# Patient Record
Sex: Male | Born: 1950
Health system: Southern US, Community
[De-identification: ages and names within clinical notes are randomized; demographics above are authoritative.]

## PROBLEM LIST (undated history)

## (undated) DIAGNOSIS — C73 Malignant neoplasm of thyroid gland: Secondary | ICD-10-CM

## (undated) DIAGNOSIS — F430 Acute stress reaction: Secondary | ICD-10-CM

## (undated) DIAGNOSIS — M6530 Trigger finger, unspecified finger: Secondary | ICD-10-CM

## (undated) DIAGNOSIS — I491 Atrial premature depolarization: Secondary | ICD-10-CM

## (undated) DIAGNOSIS — H269 Unspecified cataract: Secondary | ICD-10-CM

## (undated) DIAGNOSIS — E079 Disorder of thyroid, unspecified: Secondary | ICD-10-CM

## (undated) DIAGNOSIS — I10 Essential (primary) hypertension: Secondary | ICD-10-CM

## (undated) DIAGNOSIS — G5600 Carpal tunnel syndrome, unspecified upper limb: Secondary | ICD-10-CM

## (undated) DIAGNOSIS — E785 Hyperlipidemia, unspecified: Secondary | ICD-10-CM

## (undated) DIAGNOSIS — R413 Other amnesia: Secondary | ICD-10-CM

## (undated) DIAGNOSIS — N182 Chronic kidney disease, stage 2 (mild): Secondary | ICD-10-CM

## (undated) DIAGNOSIS — E1122 Type 2 diabetes mellitus with diabetic chronic kidney disease: Secondary | ICD-10-CM

## (undated) DIAGNOSIS — G510 Bell's palsy: Secondary | ICD-10-CM

## (undated) DIAGNOSIS — G4730 Sleep apnea, unspecified: Secondary | ICD-10-CM

## (undated) DIAGNOSIS — M109 Gout, unspecified: Secondary | ICD-10-CM

## (undated) DIAGNOSIS — R5381 Other malaise: Secondary | ICD-10-CM

## (undated) DIAGNOSIS — K648 Other hemorrhoids: Secondary | ICD-10-CM

## (undated) DIAGNOSIS — E669 Obesity, unspecified: Secondary | ICD-10-CM

## (undated) DIAGNOSIS — E119 Type 2 diabetes mellitus without complications: Secondary | ICD-10-CM

## (undated) DIAGNOSIS — R5383 Other fatigue: Secondary | ICD-10-CM

## (undated) DIAGNOSIS — R609 Edema, unspecified: Secondary | ICD-10-CM

## (undated) DIAGNOSIS — L408 Other psoriasis: Secondary | ICD-10-CM

## (undated) HISTORY — DX: Sleep apnea, unspecified: G47.30

## (undated) HISTORY — DX: Carpal tunnel syndrome, unspecified upper limb: G56.00

## (undated) HISTORY — DX: Acute stress reaction: F43.0

## (undated) HISTORY — DX: Atrial premature depolarization: I49.1

## (undated) HISTORY — DX: Disorder of thyroid, unspecified: E07.9

## (undated) HISTORY — DX: Obesity, unspecified: E66.9

## (undated) HISTORY — DX: Other amnesia: R41.3

## (undated) HISTORY — DX: Other hemorrhoids: K64.8

## (undated) HISTORY — DX: Bell's palsy: G51.0

## (undated) HISTORY — DX: Malignant neoplasm of thyroid gland: C73

## (undated) HISTORY — DX: Hyperlipidemia, unspecified: E78.5

## (undated) HISTORY — DX: Gout, unspecified: M10.9

## (undated) HISTORY — DX: Other fatigue: R53.83

## (undated) HISTORY — DX: Unspecified cataract: H26.9

## (undated) HISTORY — DX: Edema, unspecified: R60.9

## (undated) HISTORY — DX: Other psoriasis: L40.8

## (undated) HISTORY — DX: Other malaise: R53.81

## (undated) HISTORY — DX: Type 2 diabetes mellitus with diabetic chronic kidney disease: E11.22

## (undated) HISTORY — DX: Trigger finger, unspecified finger: M65.30

## (undated) HISTORY — DX: Chronic kidney disease, stage 2 (mild): N18.2

---

## 1999-03-25 ENCOUNTER — Ambulatory Visit: Admission: RE | Admit: 1999-03-25 | Discharge: 1999-03-25 | Payer: Self-pay | Admitting: Internal Medicine

## 2000-12-21 ENCOUNTER — Ambulatory Visit (HOSPITAL_BASED_OUTPATIENT_CLINIC_OR_DEPARTMENT_OTHER): Admission: RE | Admit: 2000-12-21 | Discharge: 2000-12-21 | Payer: Self-pay | Admitting: Pulmonary Disease

## 2002-11-18 ENCOUNTER — Inpatient Hospital Stay (HOSPITAL_COMMUNITY): Admission: EM | Admit: 2002-11-18 | Discharge: 2002-11-19 | Payer: Self-pay | Admitting: Internal Medicine

## 2005-01-16 HISTORY — PX: TOTAL THYROIDECTOMY: SHX2547

## 2005-04-10 ENCOUNTER — Other Ambulatory Visit: Admission: RE | Admit: 2005-04-10 | Discharge: 2005-04-10 | Payer: Self-pay | Admitting: Diagnostic Radiology

## 2005-05-19 ENCOUNTER — Ambulatory Visit (HOSPITAL_COMMUNITY): Admission: RE | Admit: 2005-05-19 | Discharge: 2005-05-20 | Payer: Self-pay

## 2005-05-19 ENCOUNTER — Encounter (INDEPENDENT_AMBULATORY_CARE_PROVIDER_SITE_OTHER): Payer: Self-pay | Admitting: Specialist

## 2005-10-10 ENCOUNTER — Encounter (INDEPENDENT_AMBULATORY_CARE_PROVIDER_SITE_OTHER): Payer: Self-pay | Admitting: *Deleted

## 2005-10-10 ENCOUNTER — Ambulatory Visit (HOSPITAL_COMMUNITY): Admission: RE | Admit: 2005-10-10 | Discharge: 2005-10-11 | Payer: Self-pay

## 2005-11-06 ENCOUNTER — Encounter: Admission: RE | Admit: 2005-11-06 | Discharge: 2005-11-06 | Payer: Self-pay | Admitting: Endocrinology

## 2005-11-16 ENCOUNTER — Encounter: Admission: RE | Admit: 2005-11-16 | Discharge: 2005-11-16 | Payer: Self-pay | Admitting: Endocrinology

## 2005-11-22 ENCOUNTER — Encounter: Admission: RE | Admit: 2005-11-22 | Discharge: 2005-11-22 | Payer: Self-pay | Admitting: Endocrinology

## 2006-06-20 ENCOUNTER — Encounter: Admission: RE | Admit: 2006-06-20 | Discharge: 2006-06-20 | Payer: Self-pay | Admitting: Endocrinology

## 2006-07-04 ENCOUNTER — Encounter: Admission: RE | Admit: 2006-07-04 | Discharge: 2006-07-04 | Payer: Self-pay | Admitting: Endocrinology

## 2006-08-06 ENCOUNTER — Encounter: Admission: RE | Admit: 2006-08-06 | Discharge: 2006-08-06 | Payer: Self-pay | Admitting: Endocrinology

## 2007-09-15 IMAGING — US US SOFT TISSUE HEAD/NECK
1 series · 14 of 25 positions shown · non-contrast
Comparison: none

CLINICAL DATA: History of thyroid carcinoma.  Thyroidectomy.  
 ULTRASOUND SOFT TISSUE HEAD/NECK:
TECHNIQUE: Ultrasound examination of the soft tissues was performed in the area of clinical concern.

[Series 1: us soft tissue head/neck · 0.07mm/px · 14 of 27 slices shown]
[im 1/27]
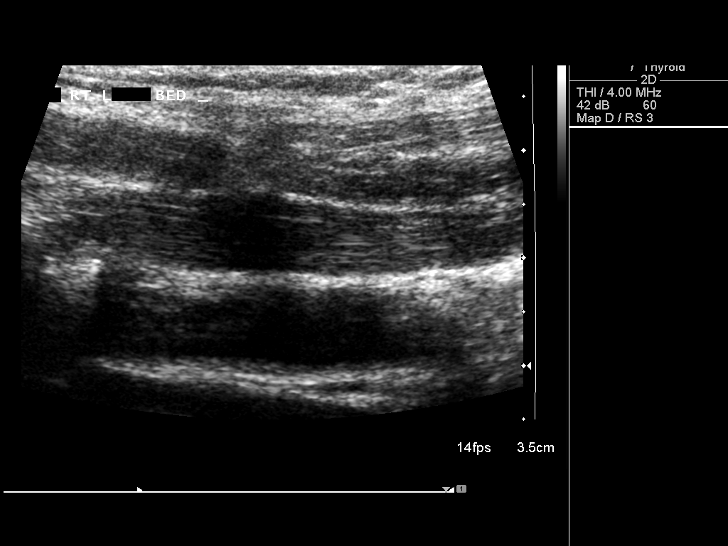
[im 3/27]
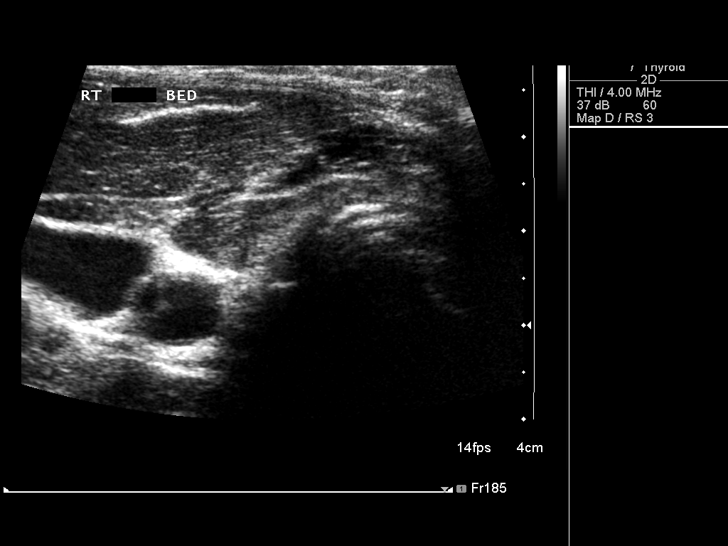
[im 5/27]
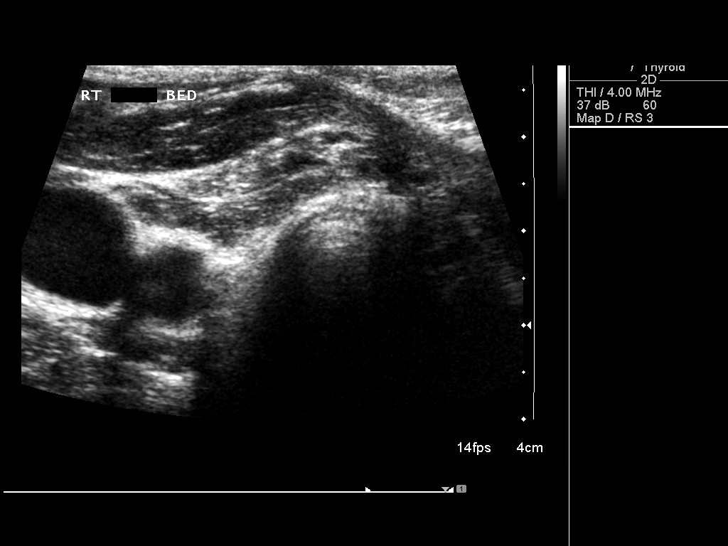
[im 7/27]
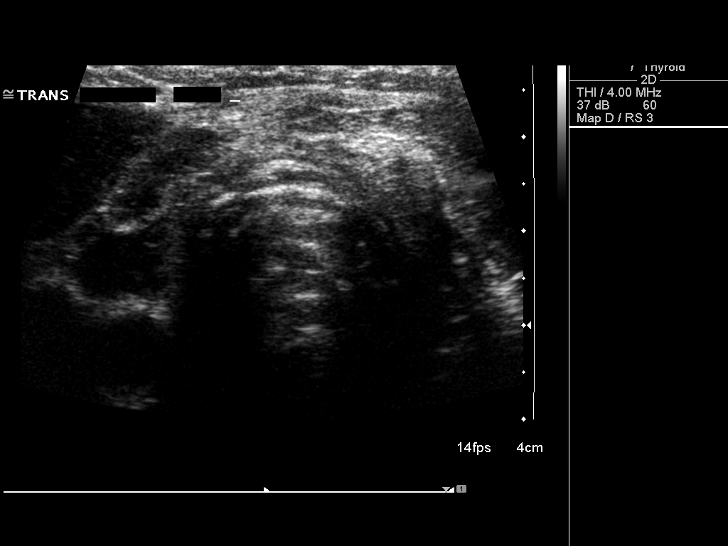
[im 9/27]
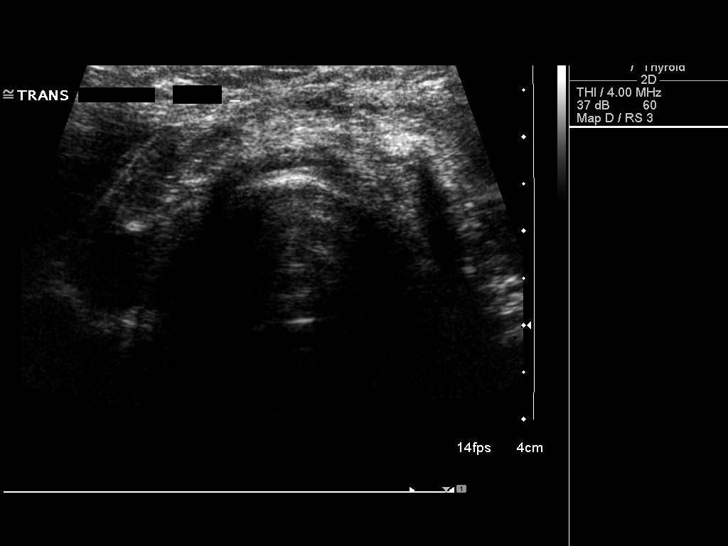
[im 10/27]
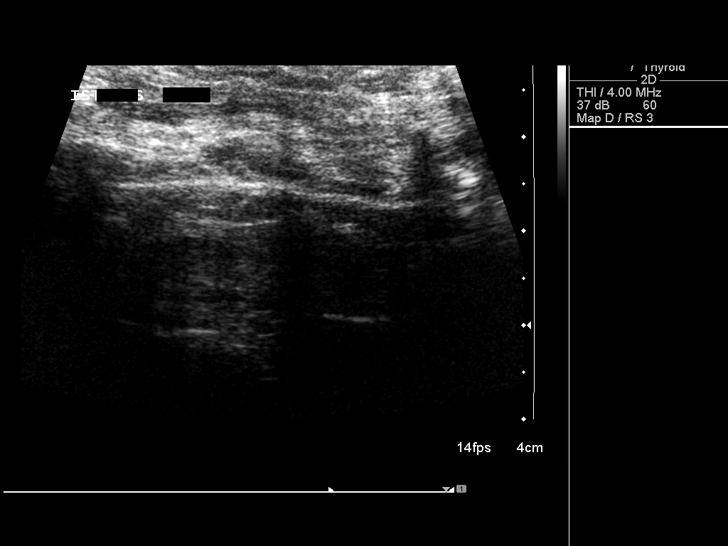
[im 12/27]
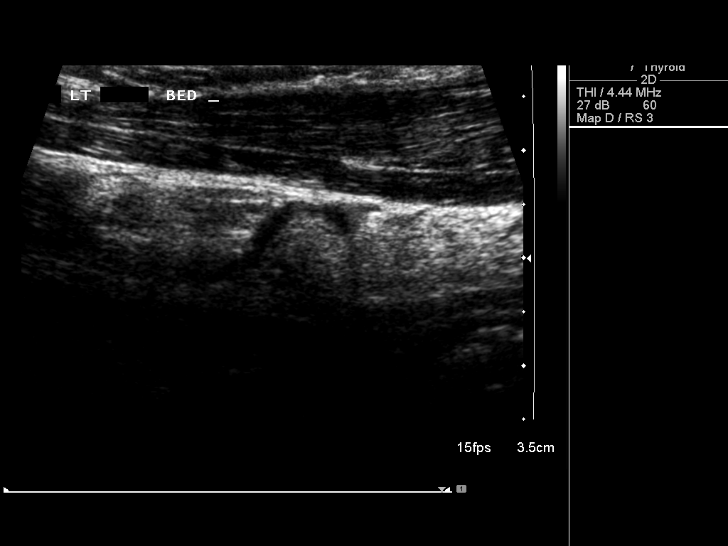
[im 15/27]
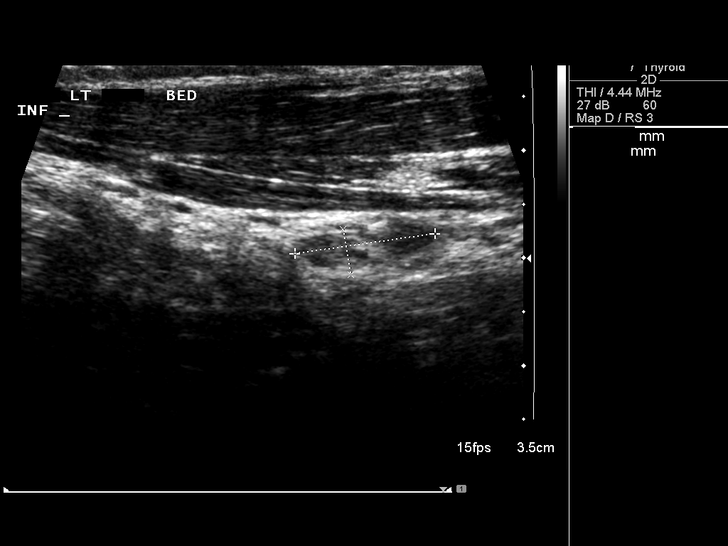
[im 17/27]
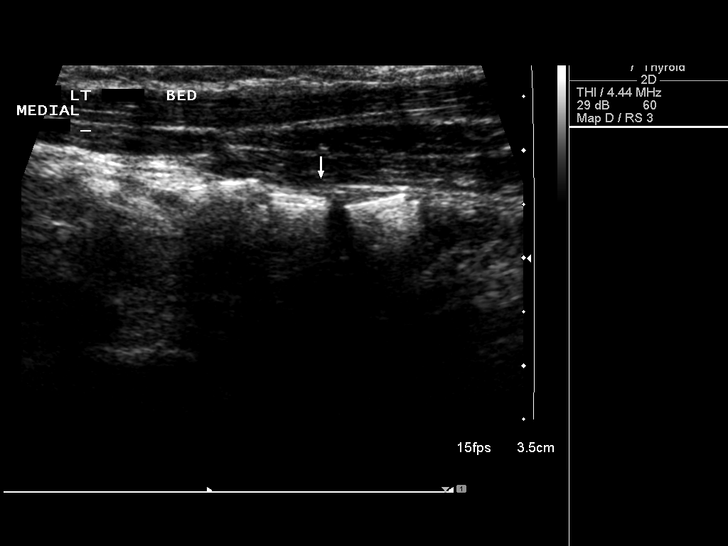
[im 18/27]
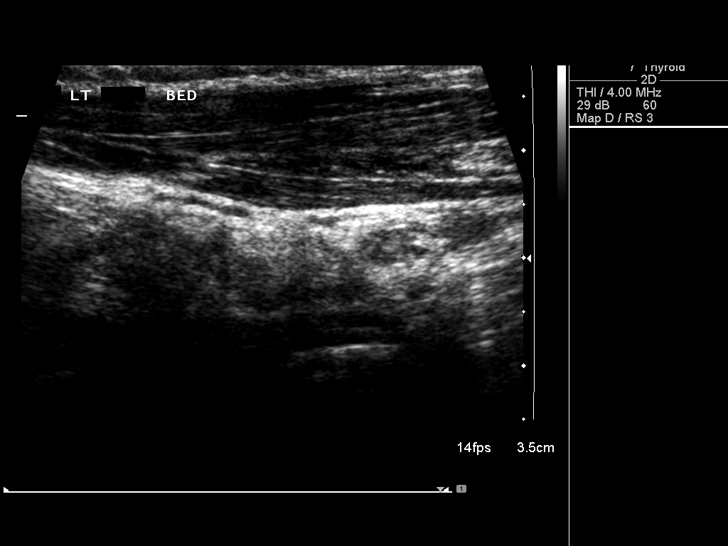
[im 20/27]
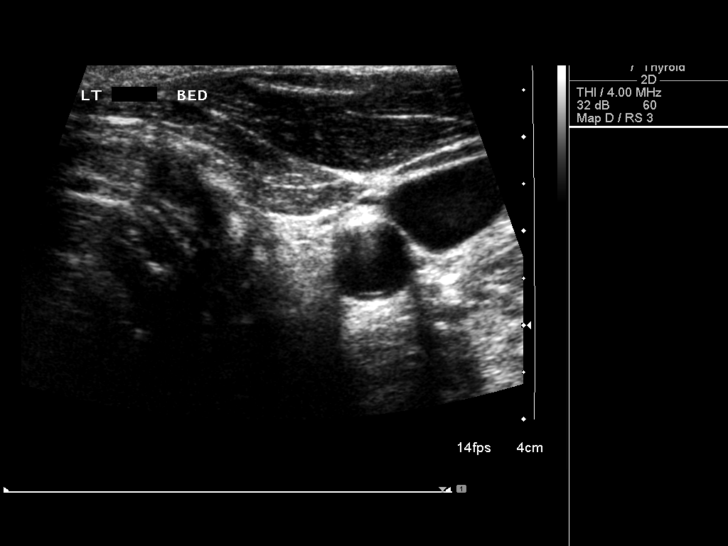
[im 22/27]
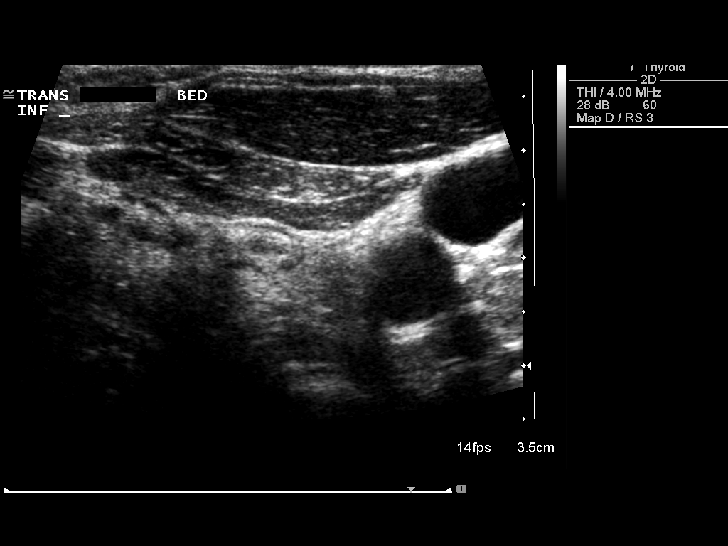
[im 24/27]
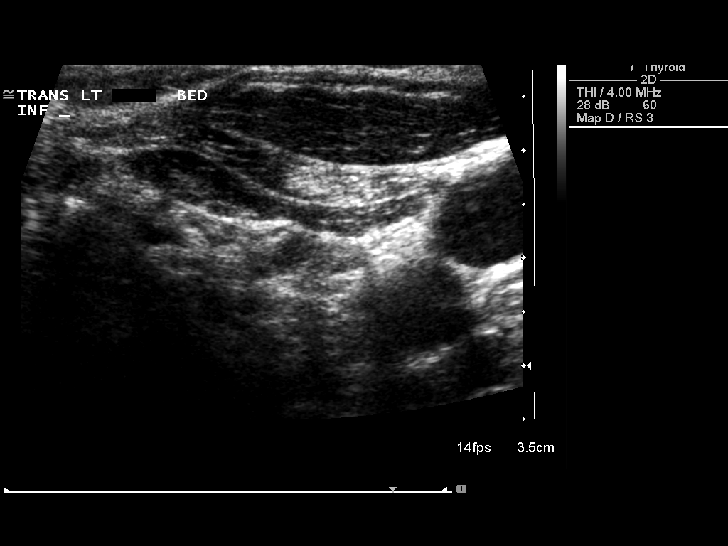
[im 27/27]
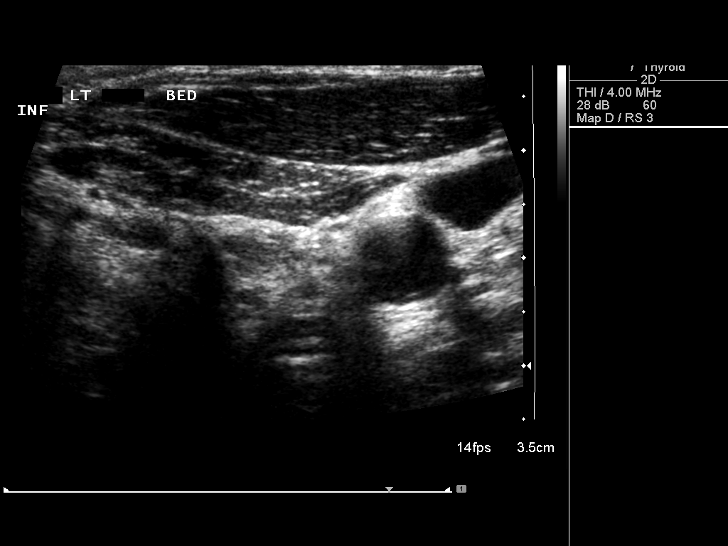

[14 of 25 positions shown; findings below may reference images not displayed]

FINDINGS: The patient has by history undergone thyroidectomy and BOCH therapy for thyroid carcinoma.  No abnormality is seen on the right.  On the left there is an oval area of soft tissue that does not appear to represent a typical node.  This area is of mixed echogenicity and measures 1.3 x 0.4 x 0.7cm.  This appears to correlate with the focus of intense activity within the neck on NM whole body V-MWM study of 06/22/06 and is therefore concerning for residual thyroid tissue.
IMPRESSION: Oval mixed echogenic soft tissue mass in the left thyroid bed does appear to correlate with intense activity on V-MWM whole body scan and is worrisome for residual ? recurrent thyroid tissue.

## 2008-07-27 ENCOUNTER — Encounter (HOSPITAL_COMMUNITY): Admission: RE | Admit: 2008-07-27 | Discharge: 2008-09-17 | Payer: Self-pay | Admitting: Endocrinology

## 2010-02-06 ENCOUNTER — Encounter: Payer: Self-pay | Admitting: Endocrinology

## 2010-02-07 ENCOUNTER — Encounter: Payer: Self-pay | Admitting: Endocrinology

## 2010-06-03 NOTE — Op Note (Signed)
Mitchell Gibbs, SAILORS NO.:  [id number]   MEDICAL RECORD NO.:  [id number]          PATIENT TYPE:  OIB   LOCATION:  0098                         FACILITY:  [facility]   PHYSICIAN:  Lebron Conners, M.D.   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   POSTOPERATIVE DIAGNOSIS:  Carcinoma of the left lobe of thyroid gland.   OPERATION:  Right thyroid lobectomy (completion thyroidectomy).   SURGEON:  Dr. Lebron Conners.   ANESTHESIA:  General.   ASSISTANT:  Dr. Jerelene Redden.   ESTIMATED BLOOD LOSS:  Minimal.   SPECIMEN:  Right lobe of thyroid gland.   COMPLICATIONS:  None.   DESCRIPTION OF PROCEDURE:  After the patient was monitored and asleep and  had routine preparation and draping of the anterior neck, I excised a  previous thyroidectomy scar back into normal skin.  I deepened the  dissection through the scar tissue until I was sure I was beneath the  platysma muscle.  I then developed a flap cephalad to the thyroid cartilage  and caudad to the sternal notch and laterally to the sternocleidomastoid  muscles.  After placement of a Mahorner retractor, I incised the midline  fascia going through the strap muscles which were stuck down to the trachea.  I identified thyroid gland and then developed a plane between the thyroid  and strap muscles laterally.  The gland was small and I did not feel any  suspicious nodules in it.  I then cut a little bit of the strap muscles  anterolaterally to expose the superior pole and bluntly dissected it free  and then encircled the superior thyroid vessels with 2-0 silk ties and tied  them off and added medium clips on the remaining part. I found two branches  of the superior thyroid artery ligating them as they entered the gland.  I  then reflected the gland  forward.  I took the inferior pole vessels after  they branched onto the gland.  I saw the inferior parathyroid gland, but did  not see the superior parathyroid gland.  As I reflected the gland from  lateral to medial, I took small veins between clips and then I noted the  recurrent laryngeal nerve arising through the tracheoesophageal groove and  entering the larynx.  I took great pains to avoid traumatizing it.  After I  was free of the nerve,  I used the Bovie to remove the remainder of the  thyroid gland.  The isthmus had a little bit left and was stuck into the  strap muscles somewhat and took that off with the Bovie and felt that I had  completely removed the right lobe and remainder of the isthmus.  I then got  hemostasis with the cautery and irrigated the wound and suctioned it dry.  I  had one tiny slow bleeder immediately adjacent to the recurrent nerve and I  simply packed that with  Surgicel and waited for 3 minutes and saw no further  bleeding and felt it was well-controlled.  I saw that the clips on the  vessels were secure.  I added some more Surgicel  and then closed the midline with running 3-0 Vicryl and closed the platysma  with running 3-0 Vicryl and closed the skin with running intracuticular  Vicryl reinforced by Steri-Strips.  The patient tolerated the operation well  and went to PACU in stable condition.      Lebron Conners, M.D.  Electronically Signed     WB/MEDQ  D:  10/10/2005  T:  10/12/2005  Job:  [id number]   cc:   Lenon Curt Chilton Si, M.D.  Fax: [phone]   Dorisann Frames, M.D.  Fax: [phone]

## 2010-06-03 NOTE — H&P (Signed)
Mitchell Gibbs, Mitchell Gibbs                           ACCOUNT NO.:  [id number]   MEDICAL RECORD NO.:  [id number]                   PATIENT TYPE:   LOCATION:  375                                  FACILITY:  [facility]   PHYSICIAN:  Karlene Einstein, M.D.             DATE OF BIRTH:  03/25/1950   DATE OF ADMISSION:  11/18/2002  DATE OF DISCHARGE:                                HISTORY & PHYSICAL   CHIEF COMPLAINT:  Right-sided facial numbness, pressure behind right ear for  2 days.   HISTORY OF PRESENT ILLNESS:  [age]-year-old African-American male with  history of hypertension, hyperlipidemia and diabetes mellitus type 2  presented to [facility] office with above complaints going on for  2 days.  He also states that he is unable to wrinkle his forehead or raise  his eyebrow on the right side.  He also notices a droopy lip on the right  side.  He denies dysarthria, extremity weakness, change in strength, gait  abnormalities or dysphagia.  He denies balance problems, numbness, or  tingling in lower extremities.  He denies dizziness.   REVIEW OF SYSTEMS:  CONSTITUTIONAL:  Denies fever, fatigue.  EYES:  Denies  change in vision, itching, or burning.  ENT:  No tinnitus, ear pain, or  epistaxis.  CARDIOVASCULAR:  No chest pain, palpitations, orthopnea, or PND.  SKIN:  No rash, itching, or bruising.  RESPIRATORY:  Denies shortness of  breath, wheezing, but he does have a mildly productive cough for 2 weeks  which is getting better.  GI:  No abdominal pain, nausea, vomiting.  GU:  No  dysuria, frequency, or hematuria.  NEURO:  No dizziness or tingling but he  does have numbness in the right side of his face and headache behind right  ear described as pressure.   PAST MEDICAL HISTORY:  History of lower extremity ankle edema, hypertension,  obstructive sleep apnea syndrome, psoriasis, diabetes mellitus type 2,  hyperlipidemia.   SOCIAL HISTORY:  He is a Consulting civil engineer.  He admits to  previous history  of smoking but quit 9 years ago.  He also has a history of alcohol abuse,  quit 9 years ago, he denies any alcohol use at this point.  He denies drug  abuse.   ALLERGIES:  No known drug allergies.   CURRENT MEDICATIONS:  1. Glucotrol XL 10 mg once daily.  2. Lotrel 5/10 one tablet p.o. once daily.   PHYSICAL EXAMINATION:  VITAL SIGNS:  Temperature 98, pulse 62, blood  pressure 132/96.  GENERAL:  No acute distress.  Well-developed male.  He does have droopiness  on right face.  HEENT:  Pupils equal, round and reactive to light.  Extraocular muscles  intact.  Oropharynx moist and clear.  CARDIOVASCULAR:  Regular rate and rhythm.  Normal S1 and S2.  No murmurs,  gallops, or rubs.  SKIN:  No rash.  Warm.  No purpura or  petechiae.  NEUROLOGIC:  Alert and oriented x3.  Gait normal.  Toes downgoing.  Strength  intact.  Sensation intact except in the right side around the V3  distribution sensation is decreased.  He has difficulty with smiling,  raising eyebrows, and wrinkling forehead on the right side.  Other cranial  nerves are intact.  ABDOMEN:  Soft, nontender, nondistended, positive bowel sounds.  No masses.  NECK:  Supple.  No JVD, thyromegaly, or lymphadenopathy.  LUNGS:  Clear to auscultation bilaterally, normal effort.  No rales, no  wheezing.  PSYCHIATRIC:  Normal affect.  Normal mood.  EXTREMITIES:  No edema.  Pulses +2.   ELECTROCARDIOGRAM:  Normal sinus rhythm at 62 beats per minute, intervals  normal, no acute changes noted.   ASSESSMENT AND PLAN:  1. Acute cerebrovascular accident.  He has positive neurologic deficits of     right-sided facial numbness, right-sided headache, drooping mouth.  His     symptoms have been constant for 2 days.  We will admit directly for     further evaluation.  We will check MRI/MRA of head now, also check CBC     with differential, CMP, PT/PTT, cardiac panel q.8h. x3, homocysteine     level, chest x-ray, fasting lipid  panel, hemoglobin A1C, 2-D echo.  We     will order PT, OT and speech therapy consults.  Aspirin 325 mg was given     in the office around 9:40 a.m. today.  Continue all other medications.     Case discussed with Dr. Jamie Brookes and the results will be called to him     upon admission.  2. Hypertension.  Stable.  Continue Lotrel.  3. Hyperlipidemia.  We will check a fasting lipid panel.  Currently not on     statins.  4. Diabetes mellitus type 2.  Check hemoglobin A1C.  Check CBGs every     morning and at bedtime.  Continue Glucotrol XL for now.   The patient is directly admitted to [facility], Room 375A.  Dr. Jamie Brookes  will take over care from there.                                               Karlene Einstein, M.D.    GD/MEDQ  D:  11/18/2002  T:  11/18/2002  Job:  [id number]

## 2010-06-03 NOTE — Op Note (Signed)
Mitchell Gibbs, SCOGIN NO.:  [id number]   MEDICAL RECORD NO.:  [id number]          PATIENT TYPE:  OIB   LOCATION:  1328                         FACILITY:  [facility]   PHYSICIAN:  Lebron Conners, M.D.   DATE OF BIRTH:  [birth year]   DATE OF PROCEDURE:  05/19/2005  DATE OF DISCHARGE:  05/20/2005                                 OPERATIVE REPORT   PREOPERATIVE DIAGNOSIS:  Nodule of the left thyroid lobe.   POSTOPERATIVE DIAGNOSIS:  Follicular lesion left lobe of thyroid.   OPERATION:  Left thyroid lobectomy.   SURGEON:  Lebron Conners, M.D.   ASSISTANT:  Sharlet Salina T. Hoxworth, M.D.   ANESTHESIA:  General.   SPECIMEN:  Left lobe of thyroid.   ESTIMATED BLOOD LOSS:  About 100 mL.   COMPLICATIONS:  None.   DISPOSITION:  The patient to PACU in good condition.   PROCEDURE:  After the patient was monitored and anesthetized and had routine  preparation and draping of the neck, I made a collar type incision about 7  cm in length a couple centimeters above the clavicles and sternal notch and  dissected down through platysma using the cautery.  I then developed flaps  beneath the platysma cephalad to the thyroid cartilage and caudad to the  clavicles and sternal notch.  I put in a self-retaining retractor and then  incised the fascia and strap muscles in the midline.  This was pushed to the  right by the large left thyroid lobe.  I dissected laterally under the strap  muscles and just outside the thyroid.  Dissection was somewhat difficult due  to the large size of the left lobe of thyroid gland.  I therefore divided  the strap muscles a little bit cephalad and got a good view of the superior  pole which was about normal in size.  I dissected around the superior pole  vessels and ligated them with 2-0 silk and with medium clips.  I then was  able to pull the superior pole forward and I dissected downward, noting some  minimal veins of the thyroid and clipped and divided  those and then  dissecting bluntly swept the lobe forward.  A very large round nodule  occupied essentially the whole of the left lobe of thyroid gland.  I came on  across staying very close to the thyroid gland so as to avoid injury to the  recurrent nerve and parathyroid glands.  I clipped and divided the inferior  pole vessels and brought it further forward.  It had it seemed as though the  plane of dissection was slightly unusual stayed well up on the gland.  After  I had swung the gland all way up it was fairly obvious that my plane of  dissection was immediately outside the large nodule and I was leaving behind  a small rim of thyroid posteriorly in the middle and superior aspects of the  lobe.  I brought the dissection all medially and then dissected back down  through the substance of the thyroid to the trachea and then applied a Kelly  clamp  across the isthmus and removed the left lobe.  I suture ligated the  isthmus with two sutures of 3-0 Vicryl.  I sent the left lobe for frozen  section examination.  While that took place, I got good hemostasis in the  operated area.  I did not see the recurrent laryngeal nerve to the plane of  dissection.  I felt that I had removed essentially all of the thyroid gland  in that lobe as the remainder was just a small very compressed rim of tissue  against the trachea and in the tracheoesophageal groove.  I was satisfied  with hemostasis.  Nevertheless I packed a little Surgicel into the operated  area.  The sponge, needle and instrument counts were correct.  I closed the  midline and platysma with running 3-0 Vicryl and closed the skin with  running intracuticular 4-0 Vicryl and Steri-Strips.  The frozen section  report of follicular lesion was received.  Dr. Delila Spence said he could not rule  out a follicular variant of papillary carcinoma, but felt it was most likely  benign.  At that point, I decided to conclude the operation.  The patient  was  awoke and went to the PACU in good condition.      Lebron Conners, M.D.  Electronically Signed     WB/MEDQ  D:  05/19/2005  T:  05/20/2005  Job:  [id number]   cc:   Lenon Curt. Chilton Si, M.D.  Fax: [phone]

## 2010-12-01 ENCOUNTER — Other Ambulatory Visit: Payer: Self-pay | Admitting: Occupational Medicine

## 2010-12-01 ENCOUNTER — Ambulatory Visit: Payer: Self-pay

## 2010-12-01 DIAGNOSIS — Z021 Encounter for pre-employment examination: Secondary | ICD-10-CM

## 2012-02-29 ENCOUNTER — Emergency Department (HOSPITAL_COMMUNITY)
Admission: EM | Admit: 2012-02-29 | Discharge: 2012-03-01 | Disposition: A | Discharge status: Home / Self Care | Payer: No Typology Code available for payment source | Attending: Emergency Medicine | Admitting: Emergency Medicine

## 2012-02-29 ENCOUNTER — Encounter (HOSPITAL_COMMUNITY): Payer: Self-pay | Admitting: Emergency Medicine

## 2012-02-29 DIAGNOSIS — S99919A Unspecified injury of unspecified ankle, initial encounter: Secondary | ICD-10-CM | POA: Insufficient documentation

## 2012-02-29 DIAGNOSIS — Y92410 Unspecified street and highway as the place of occurrence of the external cause: Secondary | ICD-10-CM | POA: Insufficient documentation

## 2012-02-29 DIAGNOSIS — S8990XA Unspecified injury of unspecified lower leg, initial encounter: Secondary | ICD-10-CM | POA: Insufficient documentation

## 2012-02-29 DIAGNOSIS — S46909A Unspecified injury of unspecified muscle, fascia and tendon at shoulder and upper arm level, unspecified arm, initial encounter: Secondary | ICD-10-CM | POA: Insufficient documentation

## 2012-02-29 DIAGNOSIS — Y9389 Activity, other specified: Secondary | ICD-10-CM | POA: Insufficient documentation

## 2012-02-29 DIAGNOSIS — Z79899 Other long term (current) drug therapy: Secondary | ICD-10-CM | POA: Insufficient documentation

## 2012-02-29 DIAGNOSIS — E119 Type 2 diabetes mellitus without complications: Secondary | ICD-10-CM | POA: Insufficient documentation

## 2012-02-29 DIAGNOSIS — IMO0002 Reserved for concepts with insufficient information to code with codable children: Secondary | ICD-10-CM | POA: Insufficient documentation

## 2012-02-29 DIAGNOSIS — S4980XA Other specified injuries of shoulder and upper arm, unspecified arm, initial encounter: Secondary | ICD-10-CM | POA: Insufficient documentation

## 2012-02-29 DIAGNOSIS — I10 Essential (primary) hypertension: Secondary | ICD-10-CM | POA: Insufficient documentation

## 2012-02-29 DIAGNOSIS — S99929A Unspecified injury of unspecified foot, initial encounter: Secondary | ICD-10-CM | POA: Insufficient documentation

## 2012-02-29 HISTORY — DX: Type 2 diabetes mellitus without complications: E11.9

## 2012-02-29 HISTORY — DX: Essential (primary) hypertension: I10
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
## 2012-03-01 ENCOUNTER — Emergency Department (HOSPITAL_COMMUNITY): Payer: No Typology Code available for payment source

## 2012-03-01 MED ORDER — TRAMADOL HCL 50 MG PO TABS: 50.0000 mg | ORAL_TABLET | Freq: Four times a day (QID) | ORAL | Status: DC | PRN

## 2012-03-01 MED ORDER — CYCLOBENZAPRINE HCL 10 MG PO TABS: 5.0000 mg | ORAL_TABLET | Freq: Two times a day (BID) | ORAL | Status: DC | PRN

## 2012-03-01 NOTE — ED Notes (Addendum)
Patient c/o headache. States he told "the nurse tech, Marisue Humble" to tell the dr. Patient states it feels like pressure, and he realized that "nothing had been done about it" when looking at his XRAY results given to him by [place], PA. PA to be made aware, states she will see him, but it will be a few minutes.

## 2012-03-01 NOTE — ED Provider Notes (Signed)
Medical screening examination/treatment/procedure(s) were performed by non-physician practitioner and as supervising physician I was immediately available for consultation/collaboration.  Gilda Crease, MD 03/01/12 [phone]

## 2012-03-01 NOTE — ED Notes (Signed)
Patient requested to be discharged. States that [place], [place] had re-evaluated his headache.

## 2012-04-08 ENCOUNTER — Other Ambulatory Visit: Payer: Self-pay | Admitting: *Deleted

## 2012-04-09 ENCOUNTER — Other Ambulatory Visit (HOSPITAL_COMMUNITY): Payer: Self-pay | Admitting: Endocrinology

## 2012-04-09 DIAGNOSIS — C73 Malignant neoplasm of thyroid gland: Secondary | ICD-10-CM

## 2012-04-22 ENCOUNTER — Encounter (HOSPITAL_COMMUNITY)
Admission: RE | Admit: 2012-04-22 | Discharge: 2012-04-22 | Disposition: A | Discharge status: Home / Self Care | Payer: BC Managed Care – PPO | Source: Ambulatory Visit | Attending: Endocrinology | Admitting: Endocrinology

## 2012-04-22 DIAGNOSIS — C73 Malignant neoplasm of thyroid gland: Secondary | ICD-10-CM | POA: Insufficient documentation

## 2012-04-22 MED ORDER — THYROTROPIN ALFA 1.1 MG IM SOLR
0.9000 mg | INTRAMUSCULAR | Status: AC
  Administered 2012-04-22: 0.9 mg via INTRAMUSCULAR
  Filled 2012-04-22: qty 0.9

## 2012-04-22 MED ORDER — STERILE WATER FOR INJECTION IJ SOLN
1.0000 mL | Freq: Once | INTRAMUSCULAR | Status: AC
  Administered 2012-04-22: 1 mL via INTRAMUSCULAR
  Filled 2012-04-22: qty 5

## 2012-04-23 ENCOUNTER — Encounter (HOSPITAL_COMMUNITY)
Admission: RE | Admit: 2012-04-23 | Discharge: 2012-04-23 | Disposition: A | Discharge status: Home / Self Care | Payer: BC Managed Care – PPO | Source: Ambulatory Visit | Attending: Endocrinology | Admitting: Endocrinology

## 2012-04-23 MED ORDER — THYROTROPIN ALFA 1.1 MG IM SOLR
0.9000 mg | INTRAMUSCULAR | Status: AC
  Administered 2012-04-23: 0.9 mg via INTRAMUSCULAR

## 2012-04-24 ENCOUNTER — Encounter (HOSPITAL_COMMUNITY)
Admission: RE | Admit: 2012-04-24 | Discharge: 2012-04-24 | Disposition: A | Discharge status: Home / Self Care | Payer: BC Managed Care – PPO | Source: Ambulatory Visit | Attending: Endocrinology | Admitting: Endocrinology

## 2012-04-24 MED ORDER — SODIUM IODIDE I 131 CAPSULE
4.0000 | Freq: Once | INTRAVENOUS | Status: AC
  Administered 2012-04-24: 4 via ORAL

## 2012-04-25 ENCOUNTER — Other Ambulatory Visit: Payer: Self-pay | Admitting: *Deleted

## 2012-04-25 DIAGNOSIS — IMO0001 Reserved for inherently not codable concepts without codable children: Secondary | ICD-10-CM

## 2012-04-25 DIAGNOSIS — C73 Malignant neoplasm of thyroid gland: Secondary | ICD-10-CM

## 2012-04-26 ENCOUNTER — Encounter (HOSPITAL_COMMUNITY)
Admission: RE | Admit: 2012-04-26 | Discharge: 2012-04-26 | Disposition: A | Discharge status: Home / Self Care | Payer: BC Managed Care – PPO | Source: Ambulatory Visit | Attending: Endocrinology | Admitting: Endocrinology

## 2012-04-26 MED ORDER — SODIUM IODIDE I 131 CAPSULE
4.0000 | Freq: Once | INTRAVENOUS | Status: AC | PRN
  Administered 2012-04-26: 4 via ORAL

## 2012-05-02 ENCOUNTER — Other Ambulatory Visit: Payer: BC Managed Care – PPO

## 2012-05-02 DIAGNOSIS — IMO0001 Reserved for inherently not codable concepts without codable children: Secondary | ICD-10-CM

## 2012-05-02 DIAGNOSIS — C73 Malignant neoplasm of thyroid gland: Secondary | ICD-10-CM

## 2012-05-03 LAB — BASIC METABOLIC PANEL
BUN: 20 mg/dL (ref 6–24)
CO2: 26 mmol/L (ref 19–28)
Calcium: 9.8 mg/dL (ref 8.7–10.2)
Chloride: 105 mmol/L (ref 97–108)
Creatinine, Ser: 1.4 mg/dL — ABNORMAL HIGH (ref 0.76–1.27)
Glucose: 56 mg/dL — ABNORMAL LOW (ref 65–99)

## 2012-05-03 LAB — TSH: TSH: 0.685 u[IU]/mL (ref 0.450–4.500)

## 2012-05-03 LAB — HEMOGLOBIN A1C
Est. average glucose Bld gHb Est-mCnc: 154 mg/dL
Hgb A1c MFr Bld: 7 % — ABNORMAL HIGH (ref 4.8–5.6)

## 2012-05-07 ENCOUNTER — Ambulatory Visit (INDEPENDENT_AMBULATORY_CARE_PROVIDER_SITE_OTHER): Payer: BC Managed Care – PPO | Admitting: Internal Medicine

## 2012-05-07 ENCOUNTER — Encounter: Payer: Self-pay | Admitting: Internal Medicine

## 2012-05-07 ENCOUNTER — Encounter: Payer: Self-pay | Admitting: *Deleted

## 2012-05-07 VITALS — BP 128/78 | HR 66 | Temp 97.0°F | Resp 18 | Ht 78.0 in | Wt 237.4 lb

## 2012-05-07 DIAGNOSIS — F430 Acute stress reaction: Secondary | ICD-10-CM

## 2012-05-07 DIAGNOSIS — E785 Hyperlipidemia, unspecified: Secondary | ICD-10-CM

## 2012-05-07 DIAGNOSIS — IMO0001 Reserved for inherently not codable concepts without codable children: Secondary | ICD-10-CM

## 2012-05-07 DIAGNOSIS — I10 Essential (primary) hypertension: Secondary | ICD-10-CM

## 2012-05-07 DIAGNOSIS — E669 Obesity, unspecified: Secondary | ICD-10-CM

## 2012-05-07 DIAGNOSIS — C73 Malignant neoplasm of thyroid gland: Secondary | ICD-10-CM

## 2012-05-07 DIAGNOSIS — R413 Other amnesia: Secondary | ICD-10-CM

## 2012-05-07 DIAGNOSIS — R609 Edema, unspecified: Secondary | ICD-10-CM

## 2012-05-07 MED ORDER — FUROSEMIDE 20 MG PO TABS: ORAL_TABLET | ORAL | Status: DC

## 2012-05-07 MED ORDER — METFORMIN HCL 1000 MG PO TABS: ORAL_TABLET | ORAL | Status: DC

## 2012-05-07 MED ORDER — TADALAFIL 20 MG PO TABS: ORAL_TABLET | ORAL | Status: DC

## 2012-05-07 NOTE — Patient Instructions (Signed)
Take medication as directed. Your lab has improved and you have successfully lost weight!

## 2012-06-11 ENCOUNTER — Other Ambulatory Visit: Payer: Self-pay | Admitting: Internal Medicine

## 2012-07-26 ENCOUNTER — Other Ambulatory Visit: Payer: Self-pay | Admitting: Internal Medicine

## 2012-08-12 ENCOUNTER — Other Ambulatory Visit: Payer: BC Managed Care – PPO

## 2012-08-13 ENCOUNTER — Ambulatory Visit (INDEPENDENT_AMBULATORY_CARE_PROVIDER_SITE_OTHER): Payer: BC Managed Care – PPO | Admitting: Internal Medicine

## 2012-08-13 ENCOUNTER — Encounter: Payer: Self-pay | Admitting: Internal Medicine

## 2012-08-13 ENCOUNTER — Other Ambulatory Visit: Payer: BC Managed Care – PPO

## 2012-08-13 VITALS — BP 120/82 | HR 66 | Temp 97.6°F | Resp 18 | Ht 78.0 in | Wt 244.6 lb

## 2012-08-13 DIAGNOSIS — G4730 Sleep apnea, unspecified: Secondary | ICD-10-CM | POA: Insufficient documentation

## 2012-08-13 DIAGNOSIS — C73 Malignant neoplasm of thyroid gland: Secondary | ICD-10-CM

## 2012-08-13 DIAGNOSIS — E119 Type 2 diabetes mellitus without complications: Secondary | ICD-10-CM | POA: Insufficient documentation

## 2012-08-13 DIAGNOSIS — E669 Obesity, unspecified: Secondary | ICD-10-CM

## 2012-08-13 DIAGNOSIS — I10 Essential (primary) hypertension: Secondary | ICD-10-CM

## 2012-08-13 DIAGNOSIS — E079 Disorder of thyroid, unspecified: Secondary | ICD-10-CM

## 2012-08-13 DIAGNOSIS — E785 Hyperlipidemia, unspecified: Secondary | ICD-10-CM | POA: Insufficient documentation

## 2012-08-13 DIAGNOSIS — R609 Edema, unspecified: Secondary | ICD-10-CM | POA: Insufficient documentation

## 2012-08-13 DIAGNOSIS — R413 Other amnesia: Secondary | ICD-10-CM | POA: Insufficient documentation
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
## 2012-08-13 NOTE — Patient Instructions (Signed)
Continue current medications. 

## 2012-08-14 LAB — BASIC METABOLIC PANEL
CO2: 28 mmol/L (ref 18–29)
Calcium: 9.6 mg/dL (ref 8.7–10.2)
Chloride: 101 mmol/L (ref 97–108)
GFR calc non Af Amer: 66 mL/min/{1.73_m2} (ref >59)
Glucose: 100 mg/dL — ABNORMAL HIGH (ref 65–99)
Potassium: 3.7 mmol/L (ref 3.5–5.2)
Sodium: 142 mmol/L (ref 134–144)

## 2012-08-14 LAB — LIPID PANEL
Chol/HDL Ratio: 2.4 ratio units (ref 0.0–5.0)
LDL Calculated: 71 mg/dL (ref 0–99)
VLDL Cholesterol Cal: 15 mg/dL (ref 5–40)

## 2012-08-14 LAB — HEMOGLOBIN A1C: Hgb A1c MFr Bld: 6.9 % — ABNORMAL HIGH (ref 4.8–5.6)

## 2012-08-15 NOTE — Addendum Note (Signed)
Addended by: Melina Modena F on: 08/15/2012 02:29 PM   Modules accepted: Orders

## 2012-08-22 ENCOUNTER — Ambulatory Visit
Admission: RE | Admit: 2012-08-22 | Discharge: 2012-08-22 | Disposition: A | Discharge status: Home / Self Care | Payer: BC Managed Care – PPO | Source: Ambulatory Visit | Attending: Internal Medicine | Admitting: Internal Medicine

## 2012-08-22 DIAGNOSIS — R609 Edema, unspecified: Secondary | ICD-10-CM

## 2012-09-01 DIAGNOSIS — E1129 Type 2 diabetes mellitus with other diabetic kidney complication: Secondary | ICD-10-CM | POA: Insufficient documentation

## 2012-09-01 NOTE — Progress Notes (Signed)
Subjective:    Patient ID: Mitchell Gibbs, male    DOB: 01/07/1951, 62 y.o.   MRN: [id number]  HPI Malignant neoplasm of thyroid gland: Continues to see Dr. Talmage Nap. Recent TSH falls within the normal range.  Type II or unspecified type diabetes mellitus without mention of complication, uncontrolled: Noncompliant with diet  Mixed disorders as reaction to stress  Other and unspecified hyperlipidemia: Controlled  Hypertension: Controlled  Edema: Left leg. Painless.  Memory loss: Patient does not believe he has an issue with his memory. He recognizes his wife is concerned about this.  Obesity, unspecified: Noncompliant with diet. Not losing weight.   Current Outpatient Prescriptions on File Prior to Visit  Medication Sig Dispense Refill  . levothyroxine (SYNTHROID, LEVOTHROID) 150 MCG tablet Take 150 mcg by mouth daily.       No current facility-administered medications on file prior to visit.     Review of Systems  Constitutional:       Overweight. Noncompliant with diet.  HENT: Negative.   Eyes: Negative.   Respiratory: Negative.   Cardiovascular: Positive for leg swelling. Negative for chest pain and palpitations.  Gastrointestinal: Negative.   Endocrine:       Diabetic. Iatrogenic hypothyroidism. He is on suppression therapy for the thyroid cancer.  Genitourinary: Negative.   Musculoskeletal: Negative.   Skin: Negative.   Allergic/Immunologic: Negative.   Neurological: Negative.   Hematological: Negative.   Psychiatric/Behavioral: Negative.        Objective:BP 128/78  Pulse 66  Temp(Src) 97 F (36.1 C) (Oral)  Resp 18  Ht 6\' 6"  (1.981 m)  Wt 237 lb 6.4 oz (107.684 kg)  BMI 27.44 kg/m2    Physical Exam  Constitutional: He is oriented to person, place, and time.  Overweight  HENT:  Head: Normocephalic and atraumatic.  Right Ear: External ear normal.  Left Ear: External ear normal.  Nose: Nose normal.  Eyes: Conjunctivae and EOM are normal. Pupils are  equal, round, and reactive to light. Right eye exhibits no discharge. Left eye exhibits no discharge.  Neck: Neck supple. No JVD present. No tracheal deviation present. No thyromegaly present.  Thyroid is enlarged approximately 3 times normal. Nontender.  Cardiovascular: Normal rate, regular rhythm, normal heart sounds and intact distal pulses.  Exam reveals no gallop and no friction rub.   No murmur heard. Pulmonary/Chest: Breath sounds normal. No respiratory distress. He has no wheezes. He has no rales. He exhibits no tenderness.  Musculoskeletal: Normal range of motion. He exhibits edema. He exhibits no tenderness.  Lymphadenopathy:    He has no cervical adenopathy.  Neurological: He is alert and oriented to person, place, and time. He has normal reflexes. No cranial nerve deficit. Coordination normal.  Skin: No rash noted. No erythema. No pallor.  Scattered psoriasis. Mild.  Psychiatric: He has a normal mood and affect. His behavior is normal. Judgment and thought content normal.     LABS REVIEWED 02/02/2012 CMP normal; glucose 93  A1c 8.5  Lipids: TC 147, trig 77, HDL 52, LDL 80  TSH 0.098  Appointment on 05/02/2012  Component Date Value Range Status  . Glucose 05/02/2012 56* 65 - 99 mg/dL Final  . BUN 78/29/5621 20  6 - 24 mg/dL Final  . Creatinine, Ser 05/02/2012 1.40* 0.76 - 1.27 mg/dL Final  . GFR calc non Af Amer 05/02/2012 55* >59 mL/min/1.73 Final  . GFR calc Af Amer 05/02/2012 63  >59 mL/min/1.73 Final  . BUN/Creatinine Ratio 05/02/2012 14  9 - 20  Final  . Sodium 05/02/2012 143  134 - 144 mmol/L Final  . Potassium 05/02/2012 4.3  3.5 - 5.2 mmol/L Final  . Chloride 05/02/2012 105  97 - 108 mmol/L Final  . CO2 05/02/2012 26  19 - 28 mmol/L Final  . Calcium 05/02/2012 9.8  8.7 - 10.2 mg/dL Final  . TSH 16/10/9602 0.685  0.450 - 4.500 uIU/mL Final  . Hemoglobin A1C 05/02/2012 7.0* 4.8 - 5.6 % Final   Comment:          Increased risk for diabetes: 5.7 - 6.4                                    Diabetes: >6.4                                   Glycemic control for adults with diabetes: <7.0  . Estimated average glucose 05/02/2012 154   Final       Assessment & Plan:  Malignant neoplasm of thyroid gland: Continue to see Dr. Talmage Nap   - Plan: TSH  Type II or unspecified type diabetes mellitus without mention of complication, uncontrolled: Stress Dr. compliance. Continue current medications.   - Plan: Basic Metabolic Panel, Hemoglobin A1c  Other and unspecified hyperlipidemia" routine management and followup   - Plan: Lipid Panel  Hypertension: Controlled  Edema: Mild and not painful.  Memory loss: Continue to screen periodically with MMSE.Marland Kitchen  Obesity, unspecified: Encourage dietary compliance.

## 2012-09-17 ENCOUNTER — Telehealth: Payer: Self-pay | Admitting: *Deleted

## 2012-09-17 NOTE — Telephone Encounter (Signed)
Patient called and left message stating that he had his Venous doppler and it came back Negative for DVT. He stated that his foot was still swelling and wants to know what to do from here. I looked up patient and reviewed chart and Dopper was Negative.   Called patient # [phone] to discuss message and Unable to leave message "mailbox is full"

## 2012-09-20 ENCOUNTER — Other Ambulatory Visit: Payer: Self-pay | Admitting: *Deleted

## 2012-09-20 ENCOUNTER — Other Ambulatory Visit: Payer: Self-pay | Admitting: Internal Medicine

## 2012-10-12 ENCOUNTER — Other Ambulatory Visit: Payer: Self-pay | Admitting: Internal Medicine

## 2012-10-15 ENCOUNTER — Encounter: Payer: Self-pay | Admitting: Internal Medicine

## 2012-10-15 ENCOUNTER — Ambulatory Visit (INDEPENDENT_AMBULATORY_CARE_PROVIDER_SITE_OTHER): Payer: BC Managed Care – PPO | Admitting: Internal Medicine

## 2012-10-15 VITALS — BP 126/88 | HR 63 | Temp 97.5°F | Wt 245.0 lb

## 2012-10-15 DIAGNOSIS — R609 Edema, unspecified: Secondary | ICD-10-CM

## 2012-10-15 DIAGNOSIS — I10 Essential (primary) hypertension: Secondary | ICD-10-CM

## 2012-10-15 MED ORDER — LOSARTAN POTASSIUM-HCTZ 100-25 MG PO TABS: ORAL_TABLET | ORAL | Status: DC

## 2012-10-15 NOTE — Progress Notes (Signed)
Subjective:    Patient ID: Mitchell Gibbs, male    DOB: 1950/05/21, 62 y.o.   MRN: [id number]  HPI Edema: he continue to have swelling of the legs. Worse on the left side. Prior US did not show DVT or popliteal cyst.  Unspecified essential hypertension: controlledt    Current Outpatient Prescriptions on File Prior to Visit  Medication Sig Dispense Refill  . B-D ULTRAFINE III SHORT PEN 31G X 8 MM MISC USE WITH INSULIN PEN  100 each  6  . betamethasone valerate ointment (VALISONE) 0.1 % Apply on the affected area daily to help rash      . furosemide (LASIX) 20 MG tablet One daily as needed to control edema  30 tablet  3  . glipiZIDE (GLUCOTROL) 10 MG tablet TAKE 1 TABLET BY MOUTH EVERY MORNING AND BEFORE SUPPER TO CONTROL DIABETES  60 tablet  6  . glucose blood (ONE TOUCH TEST STRIPS) test strip Use to check blood sugar twice a day for diabetes Dx. 250.00      . insulin glargine (LANTUS) 100 units/mL SOLN Inject 20 units once a day for diabetes 250.00      . levothyroxine (SYNTHROID, LEVOTHROID) 150 MCG tablet Take 150 mcg by mouth daily.      Marland Kitchen losartan-hydrochlorothiazide (HYZAAR) 100-25 MG per tablet TAKE 1 TABLET EVERY DAY TO CONTROL BLOOD PRESSURE  90 tablet  2  . lovastatin (MEVACOR) 40 MG tablet TAKE 1 TABLET DAILY TO CONTROL CHOLESTEROL  90 tablet  3  . metFORMIN (GLUCOPHAGE) 1000 MG tablet TAKE 1 TABLET BY MOUTH BEFORE BREAKFAST AND ONE BEFORE SUPPER TO CONTROL BLOOD SUGAR  60 tablet  6  . tadalafil (CIALIS) 20 MG tablet One as needed to help erections  6 tablet  5   No current facility-administered medications on file prior to visit.    Review of Systems  Constitutional: Negative.   HENT: Negative.   Eyes: Negative.   Respiratory: Negative.   Cardiovascular: Positive for leg swelling. Negative for chest pain and palpitations.  Gastrointestinal: Negative for abdominal pain, abdominal distention and anal bleeding.  Endocrine: Negative for cold intolerance, heat intolerance,  polydipsia, polyphagia and polyuria.       Hx thyroid malignancy and of DM.  Genitourinary: Negative.   Musculoskeletal:       Left knee discomfort.  Skin: Negative.   Allergic/Immunologic: Negative.   Neurological: Negative.   Hematological: Negative.   Psychiatric/Behavioral: Negative.        Objective:BP 126/88  Pulse 63  Temp(Src) 97.5 F (36.4 C) (Oral)  Wt 245 lb (111.131 kg)  BMI 28.32 kg/m2  SpO2 97%    Physical Exam  Constitutional: He is oriented to person, place, and time. He appears well-developed. No distress.  overweight  HENT:  Right Ear: External ear normal.  Left Ear: External ear normal.  Nose: Nose normal.  Eyes: Conjunctivae and EOM are normal. Pupils are equal, round, and reactive to light.  Neck: Normal range of motion. Neck supple. No JVD present. No tracheal deviation present. No thyromegaly present.  Cardiovascular: Normal rate, regular rhythm, normal heart sounds and intact distal pulses.  Exam reveals no gallop and no friction rub.   No murmur heard. Pulmonary/Chest: No respiratory distress. He has no wheezes. He has no rales. He exhibits no tenderness.  Abdominal: He exhibits no distension and no mass. There is no tenderness.  Musculoskeletal: He exhibits edema.  Left leg 1-2+ edema.  Lymphadenopathy:    He has no cervical adenopathy.  Neurological: He is alert and oriented to person, place, and time. He has normal reflexes. No cranial nerve deficit.  Skin: No rash noted. No erythema. No pallor.  Psychiatric: He has a normal mood and affect. His behavior is normal. Judgment and thought content normal.     Appointment on 08/13/2012  Component Date Value Range Status  . Glucose 08/13/2012 100* 65 - 99 mg/dL Final  . BUN 16/10/9602 23  6 - 24 mg/dL Final  . Creatinine, Ser 08/13/2012 1.19  0.76 - 1.27 mg/dL Final  . GFR calc non Af Amer 08/13/2012 66  >59 mL/min/1.73 Final  . GFR calc Af Amer 08/13/2012 77  >59 mL/min/1.73 Final  .  BUN/Creatinine Ratio 08/13/2012 19  9 - 20 Final  . Sodium 08/13/2012 142  134 - 144 mmol/L Final  . Potassium 08/13/2012 3.7  3.5 - 5.2 mmol/L Final  . Chloride 08/13/2012 101  97 - 108 mmol/L Final  . CO2 08/13/2012 28  18 - 29 mmol/L Final  . Calcium 08/13/2012 9.6  8.7 - 10.2 mg/dL Final  . Hemoglobin V4U 08/13/2012 6.9* 4.8 - 5.6 % Final   Comment:          Increased risk for diabetes: 5.7 - 6.4                                   Diabetes: >6.4                                   Glycemic control for adults with diabetes: <7.0  . Estimated average glucose 08/13/2012 151   Final  . TSH 08/13/2012 0.064* 0.450 - 4.500 uIU/mL Final  . Cholesterol, Total 08/13/2012 148  100 - 199 mg/dL Final  . Triglycerides 08/13/2012 77  0 - 149 mg/dL Final  . HDL 98/11/9145 62  >39 mg/dL Final   Comment: According to ATP-III Guidelines, HDL-C >59 mg/dL is considered a                          negative risk factor for CHD.  Marland Kitchen VLDL Cholesterol Cal 08/13/2012 15  5 - 40 mg/dL Final  . LDL Calculated 08/13/2012 71  0 - 99 mg/dL Final  . Chol/HDL Ratio 08/13/2012 2.4  0.0 - 5.0 ratio units Final         Assessment & Plan:  Edema - Plan: Compression stockings. If results are unsatisfactory, would arrange vascular consult with [facility]  Unspecified essential hypertension - Plan: losartan-hydrochlorothiazide (HYZAAR) 100-25 MG per tablet

## 2012-10-15 NOTE — Patient Instructions (Signed)
Try firm support compression stockings daily. Continue current medications.

## 2012-10-29 ENCOUNTER — Ambulatory Visit: Payer: BC Managed Care – PPO | Admitting: Internal Medicine

## 2012-11-11 ENCOUNTER — Other Ambulatory Visit: Payer: BC Managed Care – PPO

## 2012-11-11 DIAGNOSIS — E119 Type 2 diabetes mellitus without complications: Secondary | ICD-10-CM

## 2012-11-11 DIAGNOSIS — E785 Hyperlipidemia, unspecified: Secondary | ICD-10-CM

## 2012-11-11 DIAGNOSIS — C73 Malignant neoplasm of thyroid gland: Secondary | ICD-10-CM

## 2012-11-12 LAB — COMPREHENSIVE METABOLIC PANEL
ALT: 18 IU/L (ref 0–44)
Albumin: 4.1 g/dL (ref 3.6–4.8)
BUN: 16 mg/dL (ref 8–27)
CO2: 26 mmol/L (ref 18–29)
Calcium: 9.4 mg/dL (ref 8.6–10.2)
Chloride: 99 mmol/L (ref 97–108)
Glucose: 78 mg/dL (ref 65–99)
Potassium: 3.9 mmol/L (ref 3.5–5.2)
Total Protein: 7 g/dL (ref 6.0–8.5)

## 2012-11-12 LAB — TSH: TSH: 0.281 u[IU]/mL — ABNORMAL LOW (ref 0.450–4.500)

## 2012-11-12 LAB — LIPID PANEL
Cholesterol, Total: 149 mg/dL (ref 100–199)
HDL: 67 mg/dL (ref >39)
Triglycerides: 59 mg/dL (ref 0–149)

## 2012-11-12 LAB — HEMOGLOBIN A1C: Est. average glucose Bld gHb Est-mCnc: 163 mg/dL

## 2012-11-13 ENCOUNTER — Ambulatory Visit (INDEPENDENT_AMBULATORY_CARE_PROVIDER_SITE_OTHER): Payer: BC Managed Care – PPO | Admitting: Internal Medicine

## 2012-11-13 ENCOUNTER — Encounter: Payer: Self-pay | Admitting: Internal Medicine

## 2012-11-13 VITALS — BP 110/72 | HR 71 | Temp 98.2°F | Wt 242.6 lb

## 2012-11-13 DIAGNOSIS — E119 Type 2 diabetes mellitus without complications: Secondary | ICD-10-CM

## 2012-11-13 DIAGNOSIS — E785 Hyperlipidemia, unspecified: Secondary | ICD-10-CM

## 2012-11-13 DIAGNOSIS — IMO0001 Reserved for inherently not codable concepts without codable children: Secondary | ICD-10-CM

## 2012-11-13 DIAGNOSIS — Z8042 Family history of malignant neoplasm of prostate: Secondary | ICD-10-CM

## 2012-11-13 DIAGNOSIS — E079 Disorder of thyroid, unspecified: Secondary | ICD-10-CM

## 2012-11-13 DIAGNOSIS — C73 Malignant neoplasm of thyroid gland: Secondary | ICD-10-CM

## 2012-11-13 DIAGNOSIS — I10 Essential (primary) hypertension: Secondary | ICD-10-CM

## 2012-11-13 NOTE — Patient Instructions (Signed)
Continue current medication.

## 2012-11-13 NOTE — Progress Notes (Signed)
Subjective:    Patient ID: Mitchell Gibbs, male    DOB: 1950-11-08, 62 y.o.   MRN: [id number]  Chief Complaint  Patient presents with  . Medical Managment of Chronic Issues    3 month follow-up with labs printed  . other    FYI: needs colonoscopy scheduled.    HPI Diabetes mellitus without complication ; adequate control. Now losing weight and dieting.  Hyperlipidemia: controlled  Hypertension : controlled  Thyroid disease: on chronic suppressive therapy for prior malignacy  Malignant neoplasm of thyroid gland : chronic suppressive therapy  Family history of prostate cancer: asymptomatic    Current Outpatient Prescriptions on File Prior to Visit  Medication Sig Dispense Refill  . B-D ULTRAFINE III SHORT PEN 31G X 8 MM MISC USE WITH INSULIN PEN  100 each  6  . betamethasone valerate ointment (VALISONE) 0.1 % Apply on the affected area daily to help rash      . furosemide (LASIX) 20 MG tablet One daily as needed to control edema  30 tablet  3  . glipiZIDE (GLUCOTROL) 10 MG tablet TAKE 1 TABLET BY MOUTH EVERY MORNING AND BEFORE SUPPER TO CONTROL DIABETES  60 tablet  6  . glucose blood (ONE TOUCH TEST STRIPS) test strip Use to check blood sugar twice a day for diabetes Dx. 250.00      . insulin glargine (LANTUS) 100 units/mL SOLN Inject 20 units once a day for diabetes 250.00      . levothyroxine (SYNTHROID, LEVOTHROID) 150 MCG tablet Take 150 mcg by mouth daily.      Marland Kitchen losartan-hydrochlorothiazide (HYZAAR) 100-25 MG per tablet TAKE 1 TABLET EVERY DAY TO CONTROL BLOOD PRESSURE  90 tablet  1  . lovastatin (MEVACOR) 40 MG tablet TAKE 1 TABLET DAILY TO CONTROL CHOLESTEROL  90 tablet  3  . metFORMIN (GLUCOPHAGE) 1000 MG tablet TAKE 1 TABLET BY MOUTH BEFORE BREAKFAST AND ONE BEFORE SUPPER TO CONTROL BLOOD SUGAR  60 tablet  6  . tadalafil (CIALIS) 20 MG tablet One as needed to help erections  6 tablet  5   No current facility-administered medications on file prior to visit.     Review of Systems  Constitutional: Negative.   HENT: Negative.   Eyes: Negative.   Respiratory: Negative.   Cardiovascular: Positive for leg swelling. Negative for chest pain and palpitations.  Gastrointestinal: Negative for abdominal pain, abdominal distention and anal bleeding.  Endocrine: Negative for cold intolerance, heat intolerance, polydipsia, polyphagia and polyuria.       Hx thyroid malignancy and of DM.  Genitourinary: Negative.   Musculoskeletal:       Some times of pain and tenderness in the left ankle and upper foot. Intermittent swelling. Feels better with boots or arch support sandals.  Skin: Negative.   Allergic/Immunologic: Negative.   Neurological: Negative.   Hematological: Negative.   Psychiatric/Behavioral: Negative.        Objective:   Physical Exam  Constitutional: He is oriented to person, place, and time. He appears well-developed. No distress.  overweight  HENT:  Right Ear: External ear normal.  Left Ear: External ear normal.  Nose: Nose normal.  Eyes: Conjunctivae and EOM are normal. Pupils are equal, round, and reactive to light.  Neck: Normal range of motion. Neck supple. No JVD present. No tracheal deviation present. No thyromegaly present.  Cardiovascular: Normal rate, regular rhythm, normal heart sounds and intact distal pulses.  Exam reveals no gallop and no friction rub.   No murmur heard. Pulmonary/Chest:  No respiratory distress. He has no wheezes. He has no rales. He exhibits no tenderness.  Abdominal: He exhibits no distension and no mass. There is no tenderness.  Musculoskeletal: He exhibits edema.  Left leg 1-2+ edema. Tender at the left ankle laterally.  Lymphadenopathy:    He has no cervical adenopathy.  Neurological: He is alert and oriented to person, place, and time. He has normal reflexes. No cranial nerve deficit.  Skin: No rash noted. No erythema. No pallor.  Psychiatric: He has a normal mood and affect. His behavior is  normal. Judgment and thought content normal.      Appointment on 11/11/2012  Component Date Value Range Status  . Hemoglobin A1C 11/11/2012 7.3* 4.8 - 5.6 % Final   Comment:          Increased risk for diabetes: 5.7 - 6.4                                   Diabetes: >6.4                                   Glycemic control for adults with diabetes: <7.0  . Estimated average glucose 11/11/2012 163   Final  . Glucose 11/11/2012 78  65 - 99 mg/dL Final  . BUN 09/81/1914 16  8 - 27 mg/dL Final  . Creatinine, Ser 11/11/2012 1.34* 0.76 - 1.27 mg/dL Final  . GFR calc non Af Amer 11/11/2012 57* >59 mL/min/1.73 Final  . GFR calc Af Amer 11/11/2012 66  >59 mL/min/1.73 Final  . BUN/Creatinine Ratio 11/11/2012 12  10 - 22 Final  . Sodium 11/11/2012 141  134 - 144 mmol/L Final  . Potassium 11/11/2012 3.9  3.5 - 5.2 mmol/L Final  . Chloride 11/11/2012 99  97 - 108 mmol/L Final  . CO2 11/11/2012 26  18 - 29 mmol/L Final  . Calcium 11/11/2012 9.4  8.6 - 10.2 mg/dL Final  . Total Protein 11/11/2012 7.0  6.0 - 8.5 g/dL Final  . Albumin 78/29/5621 4.1  3.6 - 4.8 g/dL Final  . Globulin, Total 11/11/2012 2.9  1.5 - 4.5 g/dL Final  . Albumin/Globulin Ratio 11/11/2012 1.4  1.1 - 2.5 Final  . Total Bilirubin 11/11/2012 0.3  0.0 - 1.2 mg/dL Final  . Alkaline Phosphatase 11/11/2012 64  39 - 117 IU/L Final  . AST 11/11/2012 18  0 - 40 IU/L Final  . ALT 11/11/2012 18  0 - 44 IU/L Final  . TSH 11/11/2012 0.281* 0.450 - 4.500 uIU/mL Final  . Cholesterol, Total 11/11/2012 149  100 - 199 mg/dL Final  . Triglycerides 11/11/2012 59  0 - 149 mg/dL Final  . HDL 30/86/5784 67  >39 mg/dL Final   Comment: According to ATP-III Guidelines, HDL-C >59 mg/dL is considered a                          negative risk factor for CHD.  Marland Kitchen VLDL Cholesterol Cal 11/11/2012 12  5 - 40 mg/dL Final  . LDL Calculated 11/11/2012 70  0 - 99 mg/dL Final  . Chol/HDL Ratio 11/11/2012 2.2  0.0 - 5.0 ratio units Final   Comment:  T. Chol/HDL Ratio                                                                      Men  Women                                                        1/2 Avg.Risk  3.4    3.3                                                            Avg.Risk  5.0    4.4                                                         2X Avg.Risk  9.6    7.1                                                         3X Avg.Risk 23.4   11.0       Assessment & Plan:  Diabetes mellitus without complication - Plan: Hemoglobin A1c, Basic metabolic panel  Hyperlipidemia - Plan: Lipid panel  Hypertension - Plan: Basic metabolic panel  Thyroid disease: controlled. He should maintain a slightly low TSH. Discussed suppression therapy with him.  Malignant neoplasm of thyroid gland - Plan: TSH  Family history of prostate cancer - Plan: PSA

## 2012-11-30 ENCOUNTER — Other Ambulatory Visit: Payer: Self-pay | Admitting: Internal Medicine

## 2013-01-18 ENCOUNTER — Emergency Department (HOSPITAL_COMMUNITY): Payer: BC Managed Care – PPO

## 2013-01-18 ENCOUNTER — Encounter (HOSPITAL_COMMUNITY): Payer: Self-pay | Admitting: Emergency Medicine

## 2013-01-18 ENCOUNTER — Emergency Department (HOSPITAL_COMMUNITY)
Admission: EM | Admit: 2013-01-18 | Discharge: 2013-01-19 | Disposition: A | Discharge status: Home / Self Care | Payer: BC Managed Care – PPO | Attending: Emergency Medicine | Admitting: Emergency Medicine

## 2013-01-18 DIAGNOSIS — R413 Other amnesia: Secondary | ICD-10-CM | POA: Insufficient documentation

## 2013-01-18 DIAGNOSIS — Z8659 Personal history of other mental and behavioral disorders: Secondary | ICD-10-CM | POA: Insufficient documentation

## 2013-01-18 DIAGNOSIS — E669 Obesity, unspecified: Secondary | ICD-10-CM | POA: Insufficient documentation

## 2013-01-18 DIAGNOSIS — E785 Hyperlipidemia, unspecified: Secondary | ICD-10-CM | POA: Insufficient documentation

## 2013-01-18 DIAGNOSIS — E079 Disorder of thyroid, unspecified: Secondary | ICD-10-CM | POA: Insufficient documentation

## 2013-01-18 DIAGNOSIS — I10 Essential (primary) hypertension: Secondary | ICD-10-CM | POA: Insufficient documentation

## 2013-01-18 DIAGNOSIS — Z794 Long term (current) use of insulin: Secondary | ICD-10-CM | POA: Insufficient documentation

## 2013-01-18 DIAGNOSIS — Z872 Personal history of diseases of the skin and subcutaneous tissue: Secondary | ICD-10-CM | POA: Insufficient documentation

## 2013-01-18 DIAGNOSIS — Z85850 Personal history of malignant neoplasm of thyroid: Secondary | ICD-10-CM | POA: Insufficient documentation

## 2013-01-18 DIAGNOSIS — Z8739 Personal history of other diseases of the musculoskeletal system and connective tissue: Secondary | ICD-10-CM | POA: Insufficient documentation

## 2013-01-18 DIAGNOSIS — Z79899 Other long term (current) drug therapy: Secondary | ICD-10-CM | POA: Insufficient documentation

## 2013-01-18 DIAGNOSIS — E119 Type 2 diabetes mellitus without complications: Secondary | ICD-10-CM | POA: Insufficient documentation

## 2013-01-18 DIAGNOSIS — I491 Atrial premature depolarization: Secondary | ICD-10-CM | POA: Insufficient documentation

## 2013-01-18 DIAGNOSIS — Z8669 Personal history of other diseases of the nervous system and sense organs: Secondary | ICD-10-CM | POA: Insufficient documentation

## 2013-01-18 DIAGNOSIS — J189 Pneumonia, unspecified organism: Secondary | ICD-10-CM

## 2013-01-18 LAB — CBC WITH DIFFERENTIAL/PLATELET
BASOS ABS: 0 10*3/uL (ref 0.0–0.1)
Basophils Relative: 0 % (ref 0–1)
Eosinophils Absolute: 0.1 10*3/uL (ref 0.0–0.7)
Eosinophils Relative: 2 % (ref 0–5)
HCT: 38 % — ABNORMAL LOW (ref 39.0–52.0)
HEMOGLOBIN: 13.3 g/dL (ref 13.0–17.0)
Lymphocytes Relative: 5 % — ABNORMAL LOW (ref 12–46)
Lymphs Abs: 0.3 10*3/uL — ABNORMAL LOW (ref 0.7–4.0)
MCH: 28.1 pg (ref 26.0–34.0)
MCHC: 35 g/dL (ref 30.0–36.0)
MCV: 80.2 fL (ref 78.0–100.0)
MONOS PCT: 9 % (ref 3–12)
Monocytes Absolute: 0.6 10*3/uL (ref 0.1–1.0)
NEUTROS ABS: 5.6 10*3/uL (ref 1.7–7.7)
Neutrophils Relative %: 84 % — ABNORMAL HIGH (ref 43–77)
Platelets: 181 10*3/uL (ref 150–400)
RBC: 4.74 MIL/uL (ref 4.22–5.81)
RDW: 14.7 % (ref 11.5–15.5)
WBC: 6.7 10*3/uL (ref 4.0–10.5)

## 2013-01-18 LAB — BASIC METABOLIC PANEL
BUN: 17 mg/dL (ref 6–23)
CHLORIDE: 96 meq/L (ref 96–112)
CO2: 26 mEq/L (ref 19–32)
Calcium: 9.5 mg/dL (ref 8.4–10.5)
Creatinine, Ser: 1.32 mg/dL (ref 0.50–1.35)
GFR calc non Af Amer: 57 mL/min — ABNORMAL LOW (ref >90)
GFR, EST AFRICAN AMERICAN: 66 mL/min — AB (ref >90)
Glucose, Bld: 134 mg/dL — ABNORMAL HIGH (ref 70–99)
Potassium: 3.7 mEq/L (ref 3.7–5.3)
SODIUM: 135 meq/L — AB (ref 137–147)

## 2013-01-18 MED ORDER — IBUPROFEN 800 MG PO TABS
800.0000 mg | ORAL_TABLET | Freq: Once | ORAL | Status: AC
  Administered 2013-01-18: 800 mg via ORAL
  Filled 2013-01-18: qty 1

## 2013-01-18 MED ORDER — ACETAMINOPHEN 325 MG PO TABS
650.0000 mg | ORAL_TABLET | Freq: Once | ORAL | Status: AC
  Administered 2013-01-19: 650 mg via ORAL
  Filled 2013-01-18: qty 2

## 2013-01-18 MED ORDER — SODIUM CHLORIDE 0.9 % IV BOLUS (SEPSIS)
1000.0000 mL | Freq: Once | INTRAVENOUS | Status: AC
  Administered 2013-01-18: 1000 mL via INTRAVENOUS

## 2013-01-18 MED ORDER — ACETAMINOPHEN 325 MG PO TABS
650.0000 mg | ORAL_TABLET | Freq: Once | ORAL | Status: AC
  Administered 2013-01-18: 650 mg via ORAL
  Filled 2013-01-18: qty 2

## 2013-01-18 MED ORDER — LEVOFLOXACIN 500 MG PO TABS: 750.0000 mg | ORAL_TABLET | Freq: Once | ORAL | Status: DC

## 2013-01-18 NOTE — ED Notes (Signed)
Patient transported to X-ray 
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
## 2013-01-18 NOTE — ED Provider Notes (Signed)
CSN: [id number]     Arrival date & time 01/18/13  1856 History   First MD Initiated Contact with Patient 01/18/13 2003     Chief Complaint  Patient presents with  . Swallowed Foreign Body  . Fever    HPI  Mitchell Gibbs is a 64 y.o. male with a PMH of DM, HTN, thyroid disease, malignant neoplasm of thyroid gland, bell's palsy, memory loss, gout, sleep apnea, and HLD who presents to the ED for evaluation of swallowed foreign body and fever.  History was provided by the patient and his wife.  Patient states that yesterday he choked on pork rinds yesterday while eating and has had a cough ever since.  His cough is non-productive.  He denies any SOB, chest pain, or difficulty breathing/controlling secretions.  He states he has a "scratchy throat" and has a foreign body sensation in his middle chest, but denies pain.  He denies any hx of food impactions in the past.  He took some robitussin without relief.  His wife noticed that he was chilled today and took his temperature which was 102.  She brought him here to the ED for further evaluation.  He states that prior to his choking episode yesterday he was feeling well.  He denies any rhinorrhea, congestion, myalgias, fatigue, abdominal pain, emesis, nausea, diarrhea, constipation, dysuria, difficulty with urination, weakness, headache, dizziness or lightheadedness.  No known sick contacts.  No recent travel. Patient has bilateral leg edema at baseline with no acute changes. He states that he takes Lasix for his leg edema. He denies any history of congestive heart failure.  Patient received flu shot.      Past Medical History  Diagnosis Date  . Diabetes mellitus without complication   . Hypertension   . 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 Edema   . Trigger finger (acquired)   . Thyroid disease   . Malignant neoplasm of thyroid gland   . Supraventricular premature beats   . Carpal tunnel syndrome   . Memory loss   . Bell's palsy   . Internal  hemorrhoids without mention of complication   . Gout, unspecified   . Obesity, unspecified   . Other psoriasis   . Unspecified sleep apnea   . Other malaise and fatigue   . Hyperlipidemia    Past Surgical History  Procedure Laterality Date  . Total thyroidectomy     Family History  Problem Relation Age of Onset  . Hypertension Other   . Cancer - Prostate Father    History  Substance Use Topics  . Smoking status: Never Smoker   . Smokeless tobacco: Not on file  . Alcohol Use: No    Review of Systems  Constitutional: Positive for fever and chills. Negative for diaphoresis, activity change, appetite change and fatigue.  HENT: Negative for congestion, rhinorrhea and sore throat.   Respiratory: Positive for cough and choking. Negative for shortness of breath, wheezing and stridor.   Cardiovascular: Negative for chest pain and leg swelling.  Gastrointestinal: Negative for nausea, vomiting, abdominal pain, diarrhea and constipation.  Genitourinary: Negative for dysuria and difficulty urinating.  Musculoskeletal: Negative for back pain, myalgias, neck pain and neck stiffness.  Skin: Negative for rash and wound.  Neurological: Negative for dizziness, weakness, light-headedness and headaches.    Allergies  Review of patient's allergies indicates no known allergies.  Home Medications   Current Outpatient Rx  Name  Route  Sig  Dispense  Refill  .  B-D ULTRAFINE III SHORT PEN 31G X 8 MM MISC      USE WITH INSULIN PEN   100 each   6   . betamethasone valerate ointment (VALISONE) 0.1 %      Apply on the affected area daily to help rash         . furosemide (LASIX) 20 MG tablet      One daily as needed to control edema   30 tablet   3   . glipiZIDE (GLUCOTROL) 10 MG tablet      TAKE 1 TABLET BY MOUTH EVERY MORNING AND BEFORE SUPPER TO CONTROL DIABETES   60 tablet   6     CYCLE FILL MEDICATION. Authorization is required f ...   . glucose blood (ONE TOUCH TEST  STRIPS) test strip      Use to check blood sugar twice a day for diabetes Dx. 250.00         . insulin glargine (LANTUS) 100 units/mL SOLN      Inject 20 units once a day for diabetes 250.00         . levothyroxine (SYNTHROID, LEVOTHROID) 150 MCG tablet   Oral   Take 150 mcg by mouth daily.         Marland Kitchen losartan-hydrochlorothiazide (HYZAAR) 100-25 MG per tablet      TAKE 1 TABLET EVERY DAY TO CONTROL BLOOD PRESSURE   90 tablet   1     LOANED 3   . lovastatin (MEVACOR) 40 MG tablet      TAKE 1 TABLET DAILY TO CONTROL CHOLESTEROL   90 tablet   3   . metFORMIN (GLUCOPHAGE) 1000 MG tablet      TAKE 1 TABLET BY MOUTH BEFORE BREAKFAST AND ONE BEFORE SUPPER TO CONTROL BLOOD SUGAR   60 tablet   6     CYCLE FILL MEDICATION. Authorization is required f ...   . tadalafil (CIALIS) 20 MG tablet      One as needed to help erections   6 tablet   5    BP 156/84  Pulse 100  Temp(Src) 103 F (39.4 C) (Oral)  Resp 20  Ht 6\' 2"  (1.88 m)  Wt 240 lb (108.863 kg)  BMI 30.80 kg/m2  SpO2 96%  Filed Vitals:   01/18/13 2232 01/18/13 2345 01/18/13 2357 01/19/13 0043  BP: 145/74  150/85   Pulse: 100  94 96  Temp: 102.4 F (39.1 C) 101.9 F (38.8 C)  102.4 F (39.1 C)  TempSrc: Oral Oral  Oral  Resp: 16  18 16   Height:      Weight:      SpO2: 95%  96% 96%    Physical Exam  Nursing note and vitals reviewed. Constitutional: He is oriented to person, place, and time. He appears well-developed and well-nourished. No distress.  HENT:  Head: Normocephalic and atraumatic.  Right Ear: External ear normal.  Left Ear: External ear normal.  Nose: Nose normal.  Mouth/Throat: Oropharynx is clear and moist. No oropharyngeal exudate.  No erythema, exudates to posterior pharynx.  Uvula midline.  No trismus.  Unable to visualize TM's due to cerumen  Eyes: Conjunctivae and EOM are normal. Pupils are equal, round, and reactive to light. Right eye exhibits no discharge. Left eye exhibits  no discharge.  Neck: Normal range of motion. Neck supple.  No cervical spinal or paraspinal tenderness to palpation throughout.  No limitations with neck ROM.    Cardiovascular: Normal rate, regular rhythm and  normal heart sounds.  Exam reveals no gallop and no friction rub.   No murmur heard. Pulmonary/Chest: Effort normal and breath sounds normal. No respiratory distress. He has no wheezes. He has no rales. He exhibits no tenderness.  Patient coughing intermittently throughout exam  Abdominal: Soft. He exhibits no distension. There is no tenderness.  Musculoskeletal: Normal range of motion. He exhibits edema. He exhibits no tenderness.  1+ pitting equal bilateral leg edema.  Neurological: He is alert and oriented to person, place, and time.  Skin: Skin is warm and dry. He is not diaphoretic.    ED Course  Procedures (including critical care time) Labs Review Labs Reviewed - No data to display Imaging Review No results found.  EKG Interpretation   None        Date: 01/19/2013  Rate: 96  Rhythm: normal sinus rhythm  QRS Axis: normal  Intervals: normal  ST/T Wave abnormalities: normal  Conduction Disutrbances:none  Narrative Interpretation:   Old EKG Reviewed: none available   Results for orders placed during the hospital encounter of 01/18/13  CBC WITH DIFFERENTIAL      Result Value Range   WBC 6.7  4.0 - 10.5 K/uL   RBC 4.74  4.22 - 5.81 MIL/uL   Hemoglobin 13.3  13.0 - 17.0 g/dL   HCT 88.4 (*) 16.6 - 06.3 %   MCV 80.2  78.0 - 100.0 fL   MCH 28.1  26.0 - 34.0 pg   MCHC 35.0  30.0 - 36.0 g/dL   RDW 01.6  01.0 - 93.2 %   Platelets 181  150 - 400 K/uL   Neutrophils Relative % 84 (*) 43 - 77 %   Neutro Abs 5.6  1.7 - 7.7 K/uL   Lymphocytes Relative 5 (*) 12 - 46 %   Lymphs Abs 0.3 (*) 0.7 - 4.0 K/uL   Monocytes Relative 9  3 - 12 %   Monocytes Absolute 0.6  0.1 - 1.0 K/uL   Eosinophils Relative 2  0 - 5 %   Eosinophils Absolute 0.1  0.0 - 0.7 K/uL   Basophils  Relative 0  0 - 1 %   Basophils Absolute 0.0  0.0 - 0.1 K/uL  BASIC METABOLIC PANEL      Result Value Range   Sodium 135 (*) 137 - 147 mEq/L   Potassium 3.7  3.7 - 5.3 mEq/L   Chloride 96  96 - 112 mEq/L   CO2 26  19 - 32 mEq/L   Glucose, Bld 134 (*) 70 - 99 mg/dL   BUN 17  6 - 23 mg/dL   Creatinine, Ser 3.55  0.50 - 1.35 mg/dL   Calcium 9.5  8.4 - 73.2 mg/dL   GFR calc non Af Amer 57 (*) >90 mL/min   GFR calc Af Amer 66 (*) >90 mL/min    DG Chest 2 View (Final result)  Result time: 01/18/13 21:59:30    Final result by Rad Results In Interface (01/18/13 21:59:30)    Narrative:   CLINICAL DATA: Chest pain. Cough. Fever. Thyroid cancer.  EXAM: CHEST 2 VIEW  COMPARISON: 12/01/2010  FINDINGS: Mild infiltrate seen in left lower lobe, consistent with pneumonia. Right lung is clear. No evidence of pleural effusion. Heart size is normal. No mass or lymphadenopathy identified. Surgical clips again seen at the level of thoracic inlet, consistent with prior thyroidectomy.  IMPRESSION: Mild left lower lobe infiltrate, consistent with pneumonia. Recommend short-term radiographic followup in several weeks to confirm resolution.  Electronically Signed By: Earle Gell M.D. On: 01/18/2013 21:59         MDM   Mitchell Gibbs is a 63 y.o. male with a PMH of DM, HTN, thyroid disease, malignant neoplasm of thyroid gland, bell's palsy, memory loss, gout, sleep apnea, and HLD who presents to the ED for evaluation of swallowed foreign body and fever.    Rechecks  12:00 AM = Patient sleeping.   Pain controlled.  States feels fine other than the cough.  Wife and patient discussed OP vs IP management.  No hypoxia or distress.   12:30 AM = Patient and family in agreement with discharge.  Return precautions discussed.       The patient evaluated in the emergency department for cough and fever. He was found to have a pneumonia on x-ray, which is likely cause of the fever. He was given a  dose of Levaquin in the emergency department and will be treated with Levaquin as an outpatient. He had a fever in the emergency department, which reduced with Tylenol and ibuprofen, however did return upon discharge. He had no leukocytosis. Patient non-toxic in appearance.  He had mild tachycardia in the high 90's however his heart rate did not go above 100. He had no hypoxia or respiratory distress.  Patient's pneumonia severity index score of 75 which suggests outpatient or inpatient treatment depending on the clinical picture. His CURB 65 score is 0 suggesting OP managment.  Spoke with family and patient about inpatient versus outpatient management. Family and patient decided to pursue outpatient management and will return if there is worsening symptoms or any acute changes.  Patient had no complaints of chest pain or shortness of breath throughout his ED visit. EKG negative for any acute ischemic changes or prolonged QTc.  Patient encouraged to check blood sugar often to prevent hypoglycemia (may occur with Levaquin). No evidence of foreign body or impaction on chest x-ray.  Patient able to tolerate fluids without difficulty. Return precautions, discharge instructions, and follow-up was discussed with the patient and family before discharge.      Discharge Medication List as of 01/19/2013 12:50 AM    START taking these medications   Details  chlorpheniramine-HYDROcodone (TUSSIONEX PENNKINETIC ER) 10-8 MG/5ML LQCR Take 5 mLs by mouth every 12 (twelve) hours as needed for cough (Cough)., Starting 01/19/2013, Until Discontinued, Print    levofloxacin (LEVAQUIN) 750 MG tablet Take 1 tablet (750 mg total) by mouth daily., Starting 01/19/2013, Until Discontinued, Print        Final impressions: 1. Pneumonia      Mercy Moore PA-C   This patient was discussed with Dr. Kathlee Nations, PA-C 01/19/13 [phone]

## 2013-01-19 MED ORDER — LEVOFLOXACIN 750 MG PO TABS: 750.0000 mg | ORAL_TABLET | Freq: Every day | ORAL | Status: DC

## 2013-01-19 MED ORDER — LEVOFLOXACIN 500 MG PO TABS
750.0000 mg | ORAL_TABLET | Freq: Once | ORAL | Status: AC
  Administered 2013-01-19: 750 mg via ORAL
  Filled 2013-01-19: qty 2

## 2013-01-19 MED ORDER — HYDROCOD POLST-CHLORPHEN POLST 10-8 MG/5ML PO LQCR: 5.0000 mL | Freq: Two times a day (BID) | ORAL | Status: DC | PRN

## 2013-01-19 NOTE — ED Provider Notes (Signed)
Medical screening examination/treatment/procedure(s) were performed by non-physician practitioner and as supervising physician I was immediately available for consultation/collaboration.  EKG Interpretation   None         Delice Bison Regine Christian, DO 01/19/13 2345

## 2013-01-19 NOTE — ED Notes (Signed)
EKG given to EDP, Otter, MD. 

## 2013-01-19 NOTE — Discharge Instructions (Signed)
Take antibiotics for pneumonia (Levaquin)  Check your blood sugar regularly - this antibiotic may make your blood sugar drop too low  Rest and drink plenty of fluids  Return to the ED if you have any difficulty breathing/wheezing, fever not reducing with Tylenol or Ibuprofen, chest pain, stiff neck, rash, repeated vomiting, shortness of breath, coughing up blood, worsening symptoms, or any other concerns (see below)  Take Tylenol 650 mg every 4-6 hours (max 4000 mg or 4 grams)  Take Ibuprofen 600-800 mg every 6-8 hours  Alternate Ibuprofen and Tylenol for fever and pain  Follow-up with your doctor on Monday  Do not drive while taking tussionex cough medicine - this can make your drowsy - use for cough if needed      Pneumonia, Adult Pneumonia is an infection of the lungs.  CAUSES Pneumonia may be caused by bacteria or a virus. Usually, these infections are caused by breathing infectious particles into the lungs (respiratory tract). SYMPTOMS   Cough.  Fever.  Chest pain.  Increased rate of breathing.  Wheezing.  Mucus production. DIAGNOSIS  If you have the common symptoms of pneumonia, your caregiver will typically confirm the diagnosis with a chest X-ray. The X-ray will show an abnormality in the lung (pulmonary infiltrate) if you have pneumonia. Other tests of your blood, urine, or sputum may be done to find the specific cause of your pneumonia. Your caregiver may also do tests (blood gases or pulse oximetry) to see how well your lungs are working. TREATMENT  Some forms of pneumonia may be spread to other people when you cough or sneeze. You may be asked to wear a mask before and during your exam. Pneumonia that is caused by bacteria is treated with antibiotic medicine. Pneumonia that is caused by the influenza virus may be treated with an antiviral medicine. Most other viral infections must run their course. These infections will not respond to antibiotics.  PREVENTION A  pneumococcal shot (vaccine) is available to prevent a common bacterial cause of pneumonia. This is usually suggested for:  People over 41 years old.  Patients on chemotherapy.  People with chronic lung problems, such as bronchitis or emphysema.  People with immune system problems. If you are over 65 or have a high risk condition, you may receive the pneumococcal vaccine if you have not received it before. In some countries, a routine influenza vaccine is also recommended. This vaccine can help prevent some cases of pneumonia.You may be offered the influenza vaccine as part of your care. If you smoke, it is time to quit. You may receive instructions on how to stop smoking. Your caregiver can provide medicines and counseling to help you quit. HOME CARE INSTRUCTIONS   Cough suppressants may be used if you are losing too much rest. However, coughing protects you by clearing your lungs. You should avoid using cough suppressants if you can.  Your caregiver may have prescribed medicine if he or she thinks your pneumonia is caused by a bacteria or influenza. Finish your medicine even if you start to feel better.  Your caregiver may also prescribe an expectorant. This loosens the mucus to be coughed up.  Only take over-the-counter or prescription medicines for pain, discomfort, or fever as directed by your caregiver.  Do not smoke. Smoking is a common cause of bronchitis and can contribute to pneumonia. If you are a smoker and continue to smoke, your cough may last several weeks after your pneumonia has cleared.  A cold steam vaporizer  or humidifier in your room or home may help loosen mucus.  Coughing is often worse at night. Sleeping in a semi-upright position in a recliner or using a couple pillows under your head will help with this.  Get rest as you feel it is needed. Your body will usually let you know when you need to rest. SEEK IMMEDIATE MEDICAL CARE IF:   Your illness becomes worse.  This is especially true if you are elderly or weakened from any other disease.  You cannot control your cough with suppressants and are losing sleep.  You begin coughing up blood.  You develop pain which is getting worse or is uncontrolled with medicines.  You have a fever.  Any of the symptoms which initially brought you in for treatment are getting worse rather than better.  You develop shortness of breath or chest pain. MAKE SURE YOU:   Understand these instructions.  Will watch your condition.  Will get help right away if you are not doing well or get worse. Document Released: 01/02/2005 Document Revised: 03/27/2011 Document Reviewed: 03/24/2010 [facility] Patient Information 2014 [place], [place].  Fever, Adult A fever is a higher than normal body temperature. In an adult, an oral temperature around 98.6 F (37 C) is considered normal. A temperature of 100.4 F (38 C) or higher is generally considered a fever. Mild or moderate fevers generally have no long-term effects and often do not require treatment. Extreme fever (greater than or equal to 106 F or 41.1 C) can cause seizures. The sweating that may occur with repeated or prolonged fever may cause dehydration. Elderly people can develop confusion during a fever. A measured temperature can vary with: Age. Time of day. Method of measurement (mouth, underarm, rectal, or ear). The fever is confirmed by taking a temperature with a thermometer. Temperatures can be taken different ways. Some methods are accurate and some are not. An oral temperature is used most commonly. Electronic thermometers are fast and accurate. An ear temperature will only be accurate if the thermometer is positioned as recommended by the manufacturer. A rectal temperature is accurate and done for those adults who have a condition where an oral temperature cannot be taken. An underarm (axillary) temperature is not accurate and not recommended. Fever is a  symptom, not a disease.  CAUSES  Infections commonly cause fever. Some noninfectious causes for fever include: Some arthritis conditions. Some thyroid or adrenal gland conditions. Some immune system conditions. Some types of cancer. A medicine reaction. High doses of certain street drugs such as methamphetamine. Dehydration. Exposure to high outside or room temperatures. Occasionally, the source of a fever cannot be determined. This is sometimes called a "fever of unknown origin" (FUO). Some situations may lead to a temporary rise in body temperature that may go away on its own. Examples are: Childbirth. Surgery. Intense exercise. HOME CARE INSTRUCTIONS  Take appropriate medicines for fever. Follow dosing instructions carefully. If you use acetaminophen to reduce the fever, be careful to avoid taking other medicines that also contain acetaminophen. Do not take aspirin for a fever if you are younger than age 48. There is an association with Reye's syndrome. Reye's syndrome is a rare but potentially deadly disease. If an infection is present and antibiotics have been prescribed, take them as directed. Finish them even if you start to feel better. Rest as needed. Maintain an adequate fluid intake. To prevent dehydration during an illness with prolonged or recurrent fever, you may need to drink extra fluid.Drink enough fluids  to keep your urine clear or pale yellow. Sponging or bathing with room temperature water may help reduce body temperature. Do not use ice water or alcohol sponge baths. Dress comfortably, but do not over-bundle. SEEK MEDICAL CARE IF:  You are unable to keep fluids down. You develop vomiting or diarrhea. You are not feeling at least partly better after 3 days. You develop new symptoms or problems. SEEK IMMEDIATE MEDICAL CARE IF:  You have shortness of breath or trouble breathing. You develop excessive weakness. You are dizzy or you faint. You are extremely thirsty  or you are making little or no urine. You develop new pain that was not there before (such as in the head, neck, chest, back, or abdomen). You have persistant vomiting and diarrhea for more than 1 to 2 days. You develop a stiff neck or your eyes become sensitive to light. You develop a skin rash. You have a fever or persistent symptoms for more than 2 to 3 days. You have a fever and your symptoms suddenly get worse. MAKE SURE YOU:  Understand these instructions. Will watch your condition. Will get help right away if you are not doing well or get worse. Document Released: 06/28/2000 Document Revised: 03/27/2011 Document Reviewed: 11/03/2010 [facility] Patient Information 2014 [place], [place].

## 2013-01-22 ENCOUNTER — Encounter: Payer: Self-pay | Admitting: Internal Medicine

## 2013-01-22 ENCOUNTER — Ambulatory Visit (INDEPENDENT_AMBULATORY_CARE_PROVIDER_SITE_OTHER): Payer: BC Managed Care – PPO | Admitting: Internal Medicine

## 2013-01-22 VITALS — BP 146/88 | HR 93 | Temp 98.7°F | Resp 10 | Wt 235.0 lb

## 2013-01-22 DIAGNOSIS — J189 Pneumonia, unspecified organism: Secondary | ICD-10-CM

## 2013-01-22 DIAGNOSIS — R0989 Other specified symptoms and signs involving the circulatory and respiratory systems: Secondary | ICD-10-CM

## 2013-01-22 DIAGNOSIS — I10 Essential (primary) hypertension: Secondary | ICD-10-CM

## 2013-01-22 MED ORDER — ALBUTEROL SULFATE HFA 108 (90 BASE) MCG/ACT IN AERS: 2.0000 | INHALATION_SPRAY | Freq: Two times a day (BID) | RESPIRATORY_TRACT | Status: DC

## 2013-01-22 MED ORDER — LEVOTHYROXINE SODIUM 150 MCG PO TABS: 150.0000 ug | ORAL_TABLET | Freq: Every day | ORAL | Status: DC

## 2013-01-22 NOTE — Addendum Note (Signed)
Addended by: Rafael Bihari A on: 01/22/2013 05:06 PM   Modules accepted: Orders

## 2013-01-22 NOTE — Progress Notes (Signed)
Patient ID: Mitchell Gibbs, male   DOB: [birth year], 63 y.o.   MRN: [id number]    Chief Complaint  Patient presents with  . Follow-up    Patient here today to f/u from recent diagnosis of pneumonia   . FYI    Out of medication x 3 days, forgot bag with medication. Patient will restart on Sunday once bag received     HPI He started having cough and fever upto 102.5 on Saturday. He went to the ED and had an cxr showing pneumonia. He was started on levofloxacin and is taking it until 01/26/12 He has some nasal congestion. No further fever. He still feels somewhat congested in his chest but energy has improved slightly since taking his antibiotics. His cough expectorant has been helpful His wife is not feeling well at home and has similar symptoms He has missed his levothyroxine dose for past few days No other concerns  ROS- No fever or chills No dyspnea No chest pain No nausea or vomiting No bowel/ bladder complaints cbg controlled  Past Medical History  Diagnosis Date  . Diabetes mellitus without complication   . Hypertension   . 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 Edema   . Trigger finger (acquired)   . Thyroid disease   . Malignant neoplasm of thyroid gland   . Supraventricular premature beats   . Carpal tunnel syndrome   . Memory loss   . Bell's palsy   . Internal hemorrhoids without mention of complication   . Gout, unspecified   . Obesity, unspecified   . Other psoriasis   . Unspecified sleep apnea   . Other malaise and fatigue   . Hyperlipidemia    Past Surgical History  Procedure Laterality Date  . Total thyroidectomy      Medication reviewed. See MAR  BP 146/88  Pulse 93  Temp(Src) 98.7 F (37.1 C) (Oral)  Resp 10  Wt 235 lb (106.595 kg)  SpO2 95%  General- elderly male in no acute distress Head- atraumatic, normocephalic Eyes- PERRLA, EOMI, no pallor, no icterus Neck- no lymphadenopathy Chest- no chest wall deformities, no chest wall  tenderness Cardiovascular- normal s1,s2, no murmurs/ rubs/ gallops Respiratory- bilateral clear to auscultation, few wheeze, no crackles or rhonchi Abdomen- bowel sounds present, soft Musculoskeletal- able to move all 4 extremities Neurological- no focal deficit Psychiatry- alert and oriented to person, place and time, normal mood and affect   Assessment/plan  1. Pneumonia To complete course of levofloxacin of 7 days. Monitor for fever and worsening. Continue cough expectorant. Repeat cxr in 10 days - DG Chest 2 View; Future  2. Chest congestion Will provide albuterol bid x 5 days to help with his wheeze. Continue cough expectorant  3. Hypertension Stable, continue current home regimen. No changes made

## 2013-01-30 ENCOUNTER — Telehealth: Payer: Self-pay | Admitting: *Deleted

## 2013-01-30 NOTE — Telephone Encounter (Signed)
Patient called and stated that the FMLA paperwork needs to be corrected with follow up appointment dates and his dates for time out from having pneumonia. LM on patient's phone to drop paperwork off and we will give to provider to make changes.

## 2013-02-03 ENCOUNTER — Other Ambulatory Visit: Payer: Self-pay | Admitting: Internal Medicine

## 2013-02-05 ENCOUNTER — Ambulatory Visit
Admission: RE | Admit: 2013-02-05 | Discharge: 2013-02-05 | Disposition: A | Discharge status: Home / Self Care | Payer: BC Managed Care – PPO | Source: Ambulatory Visit | Attending: Internal Medicine | Admitting: Internal Medicine

## 2013-02-05 DIAGNOSIS — J189 Pneumonia, unspecified organism: Secondary | ICD-10-CM

## 2013-02-05 NOTE — Telephone Encounter (Signed)
Dr. Bubba Camp made corrections to [facility] paperwork. i made copies to scan and called patient to pick up.

## 2013-02-06 ENCOUNTER — Other Ambulatory Visit: Payer: Self-pay | Admitting: Internal Medicine

## 2013-03-06 ENCOUNTER — Other Ambulatory Visit: Payer: BC Managed Care – PPO

## 2013-03-06 DIAGNOSIS — E785 Hyperlipidemia, unspecified: Secondary | ICD-10-CM

## 2013-03-06 DIAGNOSIS — I10 Essential (primary) hypertension: Secondary | ICD-10-CM

## 2013-03-06 DIAGNOSIS — Z8042 Family history of malignant neoplasm of prostate: Secondary | ICD-10-CM

## 2013-03-06 DIAGNOSIS — C73 Malignant neoplasm of thyroid gland: Secondary | ICD-10-CM

## 2013-03-06 DIAGNOSIS — E119 Type 2 diabetes mellitus without complications: Secondary | ICD-10-CM

## 2013-03-07 ENCOUNTER — Other Ambulatory Visit: Payer: Self-pay

## 2013-03-07 LAB — BASIC METABOLIC PANEL
BUN / CREAT RATIO: 14 (ref 10–22)
BUN: 18 mg/dL (ref 8–27)
CALCIUM: 10 mg/dL (ref 8.6–10.2)
CO2: 26 mmol/L (ref 18–29)
Chloride: 101 mmol/L (ref 97–108)
Creatinine, Ser: 1.3 mg/dL — ABNORMAL HIGH (ref 0.76–1.27)
GFR calc Af Amer: 69 mL/min/{1.73_m2} (ref >59)
GFR calc non Af Amer: 59 mL/min/{1.73_m2} — ABNORMAL LOW (ref >59)
Glucose: 115 mg/dL — ABNORMAL HIGH (ref 65–99)
POTASSIUM: 3.9 mmol/L (ref 3.5–5.2)
SODIUM: 142 mmol/L (ref 134–144)

## 2013-03-07 LAB — TSH: TSH: 0.27 u[IU]/mL — ABNORMAL LOW (ref 0.450–4.500)

## 2013-03-07 LAB — LIPID PANEL
Chol/HDL Ratio: 2.3 ratio units (ref 0.0–5.0)
Cholesterol, Total: 142 mg/dL (ref 100–199)
HDL: 61 mg/dL (ref >39)
LDL CALC: 72 mg/dL (ref 0–99)
Triglycerides: 46 mg/dL (ref 0–149)
VLDL CHOLESTEROL CAL: 9 mg/dL (ref 5–40)

## 2013-03-07 LAB — HEMOGLOBIN A1C
Est. average glucose Bld gHb Est-mCnc: 163 mg/dL
HEMOGLOBIN A1C: 7.3 % — AB (ref 4.8–5.6)

## 2013-03-07 LAB — PSA: PSA: 0.5 ng/mL (ref 0.0–4.0)

## 2013-03-11 ENCOUNTER — Telehealth: Payer: Self-pay

## 2013-03-11 NOTE — Telephone Encounter (Signed)
I called [phone]  (spoke with Thayer Headings) to initiate PA, the system was unable to process paperwork for PA on Cialis, representative indicated # 8 pills will be approved for now (30 day supply) and PA may need to be completed at next refill.   I called the pharmacy with a status update, rx was resubmitted and is now available for patient (pharmacy will inform patient)

## 2013-03-12 ENCOUNTER — Ambulatory Visit (INDEPENDENT_AMBULATORY_CARE_PROVIDER_SITE_OTHER): Payer: BC Managed Care – PPO | Admitting: Internal Medicine

## 2013-03-12 ENCOUNTER — Encounter: Payer: Self-pay | Admitting: Internal Medicine

## 2013-03-12 VITALS — BP 130/82 | HR 71 | Temp 98.1°F | Resp 18 | Ht 74.0 in | Wt 240.6 lb

## 2013-03-12 DIAGNOSIS — IMO0001 Reserved for inherently not codable concepts without codable children: Secondary | ICD-10-CM

## 2013-03-12 DIAGNOSIS — I10 Essential (primary) hypertension: Secondary | ICD-10-CM

## 2013-03-12 DIAGNOSIS — R609 Edema, unspecified: Secondary | ICD-10-CM

## 2013-03-12 DIAGNOSIS — E1165 Type 2 diabetes mellitus with hyperglycemia: Secondary | ICD-10-CM

## 2013-03-12 DIAGNOSIS — N529 Male erectile dysfunction, unspecified: Secondary | ICD-10-CM

## 2013-03-12 DIAGNOSIS — E785 Hyperlipidemia, unspecified: Secondary | ICD-10-CM

## 2013-03-12 DIAGNOSIS — C73 Malignant neoplasm of thyroid gland: Secondary | ICD-10-CM

## 2013-03-12 MED ORDER — TADALAFIL 20 MG PO TABS: ORAL_TABLET | ORAL | Status: DC

## 2013-03-12 NOTE — Patient Instructions (Signed)
Continue medications as listed 

## 2013-03-12 NOTE — Progress Notes (Signed)
Patient ID: Mitchell Gibbs, male   DOB: 21-Oct-1950, 63 y.o.   MRN: [id number]    Location:    [facility]  Place of Service:  OFFICE   No Known Allergies  Chief Complaint  Patient presents with  . Follow-up    HPI:  Here for management of multiple health concerns. He is feeling well overall.  Hypertension -controlled  Type II or unspecified type diabetes mellitus without mention of complication, uncontrolled - controlled  Malignant neoplasm of thyroid gland - satisfactory lab. On chronic suppression therapy.  Hyperlipidemia: controlled  Edema: imroved  Erectile dysfunction -would like to try Cialis at a higher dose, or to get sildenifil 20 mg at [facility].    Medications: Patient's Medications  New Prescriptions   No medications on file  Previous Medications   ALBUTEROL (PROVENTIL HFA;VENTOLIN HFA) 108 (90 BASE) MCG/ACT INHALER    Inhale 2 puffs into the lungs every 12 (twelve) hours. For 5 days to help with your breathing   CHLORPHENIRAMINE-HYDROCODONE (TUSSIONEX PENNKINETIC ER) 10-8 MG/5ML LQCR    Take 5 mLs by mouth every 12 (twelve) hours as needed for cough (Cough).   CIALIS 5 MG TABLET    TAKE 1 TABLET BY MOUTH EVERY DAY TO HELP WITH ERECTIONS   FUROSEMIDE (LASIX) 20 MG TABLET    TAKE 1 TABLET BY MOUTH EVERY DAY AS NEEDED TO CONTROL EDEMA   GLIPIZIDE (GLUCOTROL) 10 MG TABLET    Take 10 mg by mouth 2 (two) times daily before a meal.   GLUCOSE BLOOD (ONE TOUCH TEST STRIPS) TEST STRIP    Use to check blood sugar twice a day for diabetes Dx. 250.00   GUAIFENESIN (ROBITUSSIN) 100 MG/5ML LIQUID    Take 200 mg by mouth 3 (three) times daily as needed for cough.   INSULIN GLARGINE (LANTUS) 100 UNITS/ML SOLN    Inject 20 units once a day for diabetes 250.00   LEVOFLOXACIN (LEVAQUIN) 750 MG TABLET    Take 1 tablet (750 mg total) by mouth daily.   LEVOTHYROXINE (SYNTHROID, LEVOTHROID) 150 MCG TABLET    Take 1 tablet (150 mcg total) by mouth daily.   LOSARTAN-HYDROCHLOROTHIAZIDE  (HYZAAR) 100-25 MG PER TABLET    TAKE 1 TABLET EVERY DAY TO CONTROL BLOOD PRESSURE   LOVASTATIN (MEVACOR) 40 MG TABLET    TAKE 1 TABLET DAILY TO CONTROL CHOLESTEROL   METFORMIN (GLUCOPHAGE) 1000 MG TABLET    Take 1,000 mg by mouth daily with breakfast.  Modified Medications   No medications on file  Discontinued Medications   No medications on file     Review of Systems  Constitutional: Negative.   HENT: Negative.   Eyes: Negative.   Respiratory: Negative.   Cardiovascular: Negative for chest pain, palpitations and leg swelling.  Gastrointestinal: Negative for abdominal pain, abdominal distention and anal bleeding.  Endocrine: Negative for cold intolerance, heat intolerance, polydipsia, polyphagia and polyuria.       Hx thyroid malignancy and of DM.  Genitourinary: Negative.   Musculoskeletal:       Some times of pain and tenderness in the left ankle and upper foot. Intermittent swelling. Feels better with boots or arch support sandals.  Skin: Negative.   Allergic/Immunologic: Negative.   Neurological: Negative.   Hematological: Negative.   Psychiatric/Behavioral: Negative.     Filed Vitals:   03/12/13 1635  BP: 130/82  Pulse: 71  Temp: 98.1 F (36.7 C)  TempSrc: Oral  Resp: 18  Height: $Remove'6\' 2"'DuPMVBc$  (1.88 m)  Weight: 240  lb 9.6 oz (109.135 kg)  SpO2: 98%   Physical Exam  Constitutional: He is oriented to person, place, and time. He appears well-developed. No distress.  overweight  HENT:  Right Ear: External ear normal.  Left Ear: External ear normal.  Nose: Nose normal.  Eyes: Conjunctivae and EOM are normal. Pupils are equal, round, and reactive to light.  Neck: Normal range of motion. Neck supple. No JVD present. No tracheal deviation present. No thyromegaly present.  Cardiovascular: Normal rate, regular rhythm, normal heart sounds and intact distal pulses.  Exam reveals no gallop and no friction rub.   No murmur heard. Pulmonary/Chest: No respiratory distress. He has  no wheezes. He has no rales. He exhibits no tenderness.  Abdominal: He exhibits no distension and no mass. There is no tenderness.  Musculoskeletal: He exhibits no edema.  Left leg trace edema.  Lymphadenopathy:    He has no cervical adenopathy.  Neurological: He is alert and oriented to person, place, and time. He has normal reflexes. No cranial nerve deficit.  Skin: No rash noted. No erythema. No pallor.  Psychiatric: He has a normal mood and affect. His behavior is normal. Judgment and thought content normal.     Labs reviewed: Appointment on 03/06/2013  Component Date Value Ref Range Status  . Cholesterol, Total 03/06/2013 142  100 - 199 mg/dL Final  . Triglycerides 03/06/2013 46  0 - 149 mg/dL Final  . HDL 03/06/2013 61  >39 mg/dL Final   Comment: According to ATP-III Guidelines, HDL-C >59 mg/dL is considered a                          negative risk factor for CHD.  Marland Kitchen VLDL Cholesterol Cal 03/06/2013 9  5 - 40 mg/dL Final  . LDL Calculated 03/06/2013 72  0 - 99 mg/dL Final  . Chol/HDL Ratio 03/06/2013 2.3  0.0 - 5.0 ratio units Final   Comment:                                   T. Chol/HDL Ratio                                                                      Men  Women                                                        1/2 Avg.Risk  3.4    3.3                                                            Avg.Risk  5.0    4.4  2X Avg.Risk  9.6    7.1                                                         3X Avg.Risk 23.4   11.0  . Hemoglobin A1C 03/06/2013 7.3* 4.8 - 5.6 % Final   Comment:          Increased risk for diabetes: 5.7 - 6.4                                   Diabetes: >6.4                                   Glycemic control for adults with diabetes: <7.0  . Estimated average glucose 03/06/2013 163   Final  . Glucose 03/06/2013 115* 65 - 99 mg/dL Final  . BUN 03/06/2013 18  8 - 27 mg/dL Final  . Creatinine,  Ser 03/06/2013 1.30* 0.76 - 1.27 mg/dL Final  . GFR calc non Af Amer 03/06/2013 59* >59 mL/min/1.73 Final  . GFR calc Af Amer 03/06/2013 69  >59 mL/min/1.73 Final  . BUN/Creatinine Ratio 03/06/2013 14  10 - 22 Final  . Sodium 03/06/2013 142  134 - 144 mmol/L Final  . Potassium 03/06/2013 3.9  3.5 - 5.2 mmol/L Final  . Chloride 03/06/2013 101  97 - 108 mmol/L Final  . CO2 03/06/2013 26  18 - 29 mmol/L Final  . Calcium 03/06/2013 10.0  8.6 - 10.2 mg/dL Final  . PSA 03/06/2013 0.5  0.0 - 4.0 ng/mL Final   Comment: Roche ECLIA methodology.                          According to the American Urological Association, Serum PSA should                          decrease and remain at undetectable levels after radical                          prostatectomy. The AUA defines biochemical recurrence as an initial                          PSA value 0.2 ng/mL or greater followed by a subsequent confirmatory                          PSA value 0.2 ng/mL or greater.                          Values obtained with different assay methods or kits cannot be used                          interchangeably. Results cannot be interpreted as absolute evidence                          of the presence or absence of malignant disease.  Marland Kitchen TSH  03/06/2013 0.270* 0.450 - 4.500 uIU/mL Final  Admission on 01/18/2013, Discharged on 01/19/2013  Component Date Value Ref Range Status  . WBC 01/18/2013 6.7  4.0 - 10.5 K/uL Final  . RBC 01/18/2013 4.74  4.22 - 5.81 MIL/uL Final  . Hemoglobin 01/18/2013 13.3  13.0 - 17.0 g/dL Final  . HCT 01/18/2013 38.0* 39.0 - 52.0 % Final  . MCV 01/18/2013 80.2  78.0 - 100.0 fL Final  . MCH 01/18/2013 28.1  26.0 - 34.0 pg Final  . MCHC 01/18/2013 35.0  30.0 - 36.0 g/dL Final  . RDW 01/18/2013 14.7  11.5 - 15.5 % Final  . Platelets 01/18/2013 181  150 - 400 K/uL Final  . Neutrophils Relative % 01/18/2013 84* 43 - 77 % Final  . Neutro Abs 01/18/2013 5.6  1.7 - 7.7 K/uL Final  . Lymphocytes  Relative 01/18/2013 5* 12 - 46 % Final  . Lymphs Abs 01/18/2013 0.3* 0.7 - 4.0 K/uL Final  . Monocytes Relative 01/18/2013 9  3 - 12 % Final  . Monocytes Absolute 01/18/2013 0.6  0.1 - 1.0 K/uL Final  . Eosinophils Relative 01/18/2013 2  0 - 5 % Final  . Eosinophils Absolute 01/18/2013 0.1  0.0 - 0.7 K/uL Final  . Basophils Relative 01/18/2013 0  0 - 1 % Final  . Basophils Absolute 01/18/2013 0.0  0.0 - 0.1 K/uL Final  . Sodium 01/18/2013 135* 137 - 147 mEq/L Final   Please note change in reference range.  . Potassium 01/18/2013 3.7  3.7 - 5.3 mEq/L Final   Please note change in reference range.  . Chloride 01/18/2013 96  96 - 112 mEq/L Final  . CO2 01/18/2013 26  19 - 32 mEq/L Final  . Glucose, Bld 01/18/2013 134* 70 - 99 mg/dL Final  . BUN 01/18/2013 17  6 - 23 mg/dL Final  . Creatinine, Ser 01/18/2013 1.32  0.50 - 1.35 mg/dL Final  . Calcium 01/18/2013 9.5  8.4 - 10.5 mg/dL Final  . GFR calc non Af Amer 01/18/2013 57* >90 mL/min Final  . GFR calc Af Amer 01/18/2013 66* >90 mL/min Final   Comment: (NOTE)                          The eGFR has been calculated using the CKD EPI equation.                          This calculation has not been validated in all clinical situations.                          eGFR's persistently <90 mL/min signify possible Chronic Kidney                          Disease.      Assessment/Plan  1. Hypertension controlled - Comprehensive metabolic panel; Future  2. Type II or unspecified type diabetes mellitus without mention of complication, uncontrolled controlled - Hemoglobin A1c; Future - Comprehensive metabolic panel; Future - Microalbumin / creatinine urine ratio; Future  3. Malignant neoplasm of thyroid gland Chronic supression therapy - TSH; Future  4. Hyperlipidemia controlled  5. Edema improved  6. Erectile dysfunction - tadalafil (CIALIS) 20 MG tablet; Take 1/2 to 1 tablet prior to intercourse to help erectile dysfunction   Dispense: 30 tablet; Refill: 3 Was also give a prescription for  sildenifil 20 mg.

## 2013-04-12 ENCOUNTER — Other Ambulatory Visit: Payer: Self-pay | Admitting: Internal Medicine

## 2013-04-15 ENCOUNTER — Encounter: Payer: Self-pay | Admitting: Internal Medicine

## 2013-04-15 ENCOUNTER — Ambulatory Visit (INDEPENDENT_AMBULATORY_CARE_PROVIDER_SITE_OTHER): Payer: BC Managed Care – PPO | Admitting: Internal Medicine

## 2013-04-15 VITALS — BP 150/86 | HR 64 | Temp 97.5°F | Wt 245.0 lb

## 2013-04-15 DIAGNOSIS — IMO0001 Reserved for inherently not codable concepts without codable children: Secondary | ICD-10-CM

## 2013-04-15 DIAGNOSIS — I10 Essential (primary) hypertension: Secondary | ICD-10-CM

## 2013-04-15 DIAGNOSIS — G4730 Sleep apnea, unspecified: Secondary | ICD-10-CM

## 2013-04-15 DIAGNOSIS — Z23 Encounter for immunization: Secondary | ICD-10-CM

## 2013-04-15 DIAGNOSIS — C73 Malignant neoplasm of thyroid gland: Secondary | ICD-10-CM

## 2013-04-15 DIAGNOSIS — R609 Edema, unspecified: Secondary | ICD-10-CM

## 2013-04-15 DIAGNOSIS — E785 Hyperlipidemia, unspecified: Secondary | ICD-10-CM

## 2013-04-15 DIAGNOSIS — E669 Obesity, unspecified: Secondary | ICD-10-CM

## 2013-04-15 DIAGNOSIS — E1165 Type 2 diabetes mellitus with hyperglycemia: Secondary | ICD-10-CM

## 2013-04-15 MED ORDER — ZOSTER VACCINE LIVE 19400 UNT/0.65ML ~~LOC~~ SOLR: 0.6500 mL | Freq: Once | SUBCUTANEOUS | Status: DC

## 2013-04-15 NOTE — Progress Notes (Signed)
Patient ID: Mitchell Gibbs, male   DOB: [birth year], 63 y.o.   MRN: [id number]    Location:    [facility]  Place of Service:  OFFICE  PCP: Estill Dooms, MD  Code Status: FULL  Extended Emergency Contact Information Primary Emergency Contact: Hanna,[name] Address: [address]          [place], [place] [address] [place] Phone: [phone] Mobile Phone: [phone] Relation: Spouse  No Known Allergies  Chief Complaint  Patient presents with  . Acute Visit     LT ankle /leg swollen daily x 3-4 weeks    . Immunizations    print Shingles vaccine    HPI:  Hypertension - mild e;evationn in the SBP. No headache, chest pain, or palpitations.  Type II or unspecified type diabetes mellitus without mention of complication, uncontrolled -adequately controlled  Obesity, unspecified: no weight loss. has stopped working out  Malignant neoplasm of thyroid gland - Resected 2007 by Dr. Deon Pilling. Continues under care of Dr.Balan, Endo.  He is now seeing her about once per year. Remains on thyroid suppression doses of levothyroxine.(low TSH). No evidence for reoccurrence.  Edema - worsening of left leg edema that has been present for > 1 year. Had venous Doppler 2014 that disclosed varicose veins. He has no pain. He might have strained the left knee when picking up a heavy box about a month ago. Denies pain in the left knee, but it does feel tight sometimes.  Hyperlipidemia: controlled  Unspecified sleep apnea: does not wear CPAP regularly  Complains of discomfort in the left SI joint area. Present about a month. Comes and goes.No urologic symptoms. Does not interfere with movement.  Immunization due -  (ZOSTAVAX)       Past Medical History  Diagnosis Date  . Diabetes mellitus without complication   . Hypertension   . 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 Edema   . Trigger finger (acquired)   . Thyroid disease   . Malignant neoplasm of thyroid gland   .  Supraventricular premature beats   . Carpal tunnel syndrome   . Memory loss   . Bell's palsy   . Internal hemorrhoids without mention of complication   . Gout, unspecified   . Obesity, unspecified   . Other psoriasis   . Unspecified sleep apnea   . Other malaise and fatigue   . Hyperlipidemia     Past Surgical History  Procedure Laterality Date  . Total thyroidectomy      CONSULTANTS Endo: Bindubal Balan Gen Surg: Deon Pilling  PAST PROCEDURES 04/09/12 THYROGEN-STIMULATED I-131 WHOLE BODY SCAN: normal  08/15/12 US venous, left leg: Findings:  Normal compressibility and normal Doppler signal within the common femoral, superficial femoral and popliteal veins, down to the proximal calf veins.  No grayscale filling defects to suggest DVT.Multiple varicosities noted superficially along the region of the greater saphenous vein. No evidence of left lower extremity deep vein thrombosis     Social History: History   Social History  . Marital Status: Married    Spouse Name: N/A    Number of Children: N/A  . Years of Education: N/A   Social History Main Topics  . Smoking status: Never Smoker   . Smokeless tobacco: None  . Alcohol Use: No  . Drug Use: No  . Sexual Activity: None   Other Topics Concern  . None   Social History Narrative  . None    Family History Family Status  Relation Status Death Age  . Mother Alive   . Father Deceased   . Sister Alive   . Brother Deceased   . Daughter Alive   . Sister Alive   . Daughter Alive   . Daughter Alive    Family History  Problem Relation Age of Onset  . Hypertension Other   . Cancer - Prostate Father      Medications: Patient's Medications  New Prescriptions   No medications on file  Previous Medications   ALBUTEROL (PROVENTIL HFA;VENTOLIN HFA) 108 (90 BASE) MCG/ACT INHALER    Inhale 2 puffs into the lungs every 12 (twelve) hours. For 5 days to help with your breathing   FUROSEMIDE (LASIX) 20 MG TABLET    TAKE  1 TABLET BY MOUTH EVERY DAY AS NEEDED TO CONTROL EDEMA   GLIPIZIDE (GLUCOTROL) 10 MG TABLET    Take 10 mg by mouth 2 (two) times daily before a meal.   GLUCOSE BLOOD (ONE TOUCH TEST STRIPS) TEST STRIP    Use to check blood sugar twice a day for diabetes Dx. 250.00   GUAIFENESIN (ROBITUSSIN) 100 MG/5ML LIQUID    Take 200 mg by mouth 3 (three) times daily as needed for cough.   INSULIN GLARGINE (LANTUS) 100 UNITS/ML SOLN    Inject 20 units once a day for diabetes 250.00   LEVOTHYROXINE (SYNTHROID, LEVOTHROID) 150 MCG TABLET    Take 1 tablet (150 mcg total) by mouth daily.   LOSARTAN-HYDROCHLOROTHIAZIDE (HYZAAR) 100-25 MG PER TABLET    TAKE 1 TABLET EVERY DAY TO CONTROL BLOOD PRESSURE   LOVASTATIN (MEVACOR) 40 MG TABLET    TAKE 1 TABLET DAILY TO CONTROL CHOLESTEROL   METFORMIN (GLUCOPHAGE) 1000 MG TABLET    Take 1,000 mg by mouth daily with breakfast.   TADALAFIL (CIALIS) 20 MG TABLET    Take 1/2 to 1 tablet prior to intercourse to help erectile dysfunction  Modified Medications   Modified Medication Previous Medication   ZOSTER VACCINE LIVE, PF, (ZOSTAVAX) 41937 UNT/0.65ML INJECTION zoster vaccine live, PF, (ZOSTAVAX) 90240 UNT/0.65ML injection      Inject 19,400 Units into the skin once.    Inject 0.65 mLs into the skin once.  Discontinued Medications   CHLORPHENIRAMINE-HYDROCODONE (TUSSIONEX PENNKINETIC ER) 10-8 MG/5ML LQCR    Take 5 mLs by mouth every 12 (twelve) hours as needed for cough (Cough).   LEVOFLOXACIN (LEVAQUIN) 750 MG TABLET    Take 1 tablet (750 mg total) by mouth daily.    Immunization History  Administered Date(s) Administered  . Influenza-Unspecified 09/16/2012  . Pneumococcal Polysaccharide-23 10/29/2001  . Tdap 03/06/2011     Review of Systems  Constitutional: Negative.   HENT: Negative.   Eyes: Negative.   Respiratory: Negative.   Cardiovascular: Negative for chest pain, palpitations and leg swelling.  Gastrointestinal: Negative for abdominal pain, abdominal  distention and anal bleeding.  Endocrine: Negative for cold intolerance, heat intolerance, polydipsia, polyphagia and polyuria.       Hx thyroid malignancy and of DM.  Genitourinary: Negative.   Musculoskeletal:       Swollen left leg  Skin: Negative.   Allergic/Immunologic: Negative.   Neurological: Negative.   Hematological: Negative.   Psychiatric/Behavioral: Negative.       Filed Vitals:   04/15/13 1526  BP: 150/86  Pulse: 64  Temp: 97.5 F (36.4 C)  TempSrc: Oral  Weight: 245 lb (111.131 kg)  SpO2: 99%   Physical Exam  Constitutional: He is oriented to person, place, and time.  overweight  HENT:  Right Ear: External ear normal.  Left Ear: External ear normal.  Nose: Nose normal.  Mouth/Throat: Oropharynx is clear and moist. No oropharyngeal exudate.  Eyes: Conjunctivae and EOM are normal. Pupils are equal, round, and reactive to light.  Neck: No JVD present. No tracheal deviation present. No thyromegaly present.  Cardiovascular: Normal rate, regular rhythm, normal heart sounds and intact distal pulses.  Exam reveals no gallop and no friction rub.   No murmur heard. Respiratory: No respiratory distress. He has no wheezes. He has no rales. He exhibits no tenderness.  GI: He exhibits no distension and no mass. There is no tenderness.  Musculoskeletal: Normal range of motion. He exhibits edema (left leg). He exhibits no tenderness.  No palpable popliteal cyst. Mild discomfort left Si joint area.  Lymphadenopathy:    He has no cervical adenopathy.  Neurological: He is alert and oriented to person, place, and time. He has normal reflexes. No cranial nerve deficit. Coordination normal.  12/01/2009 Total 30 out of 30. Passed clock drawing  Skin: No rash noted. No erythema. No pallor.  Psychiatric: He has a normal mood and affect. His behavior is normal. Judgment and thought content normal.       Labs reviewed: Appointment on 03/06/2013  Component Date Value Ref  Range Status  . Cholesterol, Total 03/06/2013 142  100 - 199 mg/dL Final  . Triglycerides 03/06/2013 46  0 - 149 mg/dL Final  . HDL 03/06/2013 61  >39 mg/dL Final   Comment: According to ATP-III Guidelines, HDL-C >59 mg/dL is considered a                          negative risk factor for CHD.  Marland Kitchen VLDL Cholesterol Cal 03/06/2013 9  5 - 40 mg/dL Final  . LDL Calculated 03/06/2013 72  0 - 99 mg/dL Final  . Chol/HDL Ratio 03/06/2013 2.3  0.0 - 5.0 ratio units Final   Comment:                                   T. Chol/HDL Ratio                                                                      Men  Women                                                        1/2 Avg.Risk  3.4    3.3                                                            Avg.Risk  5.0    4.4  2X Avg.Risk  9.6    7.1                                                         3X Avg.Risk 23.4   11.0  . Hemoglobin A1C 03/06/2013 7.3* 4.8 - 5.6 % Final   Comment:          Increased risk for diabetes: 5.7 - 6.4                                   Diabetes: >6.4                                   Glycemic control for adults with diabetes: <7.0  . Estimated average glucose 03/06/2013 163   Final  . Glucose 03/06/2013 115* 65 - 99 mg/dL Final  . BUN 03/06/2013 18  8 - 27 mg/dL Final  . Creatinine, Ser 03/06/2013 1.30* 0.76 - 1.27 mg/dL Final  . GFR calc non Af Amer 03/06/2013 59* >59 mL/min/1.73 Final  . GFR calc Af Amer 03/06/2013 69  >59 mL/min/1.73 Final  . BUN/Creatinine Ratio 03/06/2013 14  10 - 22 Final  . Sodium 03/06/2013 142  134 - 144 mmol/L Final  . Potassium 03/06/2013 3.9  3.5 - 5.2 mmol/L Final  . Chloride 03/06/2013 101  97 - 108 mmol/L Final  . CO2 03/06/2013 26  18 - 29 mmol/L Final  . Calcium 03/06/2013 10.0  8.6 - 10.2 mg/dL Final  . PSA 03/06/2013 0.5  0.0 - 4.0 ng/mL Final   Comment: Roche ECLIA methodology.                          According to the  American Urological Association, Serum PSA should                          decrease and remain at undetectable levels after radical                          prostatectomy. The AUA defines biochemical recurrence as an initial                          PSA value 0.2 ng/mL or greater followed by a subsequent confirmatory                          PSA value 0.2 ng/mL or greater.                          Values obtained with different assay methods or kits cannot be used                          interchangeably. Results cannot be interpreted as absolute evidence                          of the presence or absence of malignant disease.  Marland Kitchen TSH  03/06/2013 0.270* 0.450 - 4.500 uIU/mL Final  Admission on 01/18/2013, Discharged on 01/19/2013  Component Date Value Ref Range Status  . WBC 01/18/2013 6.7  4.0 - 10.5 K/uL Final  . RBC 01/18/2013 4.74  4.22 - 5.81 MIL/uL Final  . Hemoglobin 01/18/2013 13.3  13.0 - 17.0 g/dL Final  . HCT 01/18/2013 38.0* 39.0 - 52.0 % Final  . MCV 01/18/2013 80.2  78.0 - 100.0 fL Final  . MCH 01/18/2013 28.1  26.0 - 34.0 pg Final  . MCHC 01/18/2013 35.0  30.0 - 36.0 g/dL Final  . RDW 01/18/2013 14.7  11.5 - 15.5 % Final  . Platelets 01/18/2013 181  150 - 400 K/uL Final  . Neutrophils Relative % 01/18/2013 84* 43 - 77 % Final  . Neutro Abs 01/18/2013 5.6  1.7 - 7.7 K/uL Final  . Lymphocytes Relative 01/18/2013 5* 12 - 46 % Final  . Lymphs Abs 01/18/2013 0.3* 0.7 - 4.0 K/uL Final  . Monocytes Relative 01/18/2013 9  3 - 12 % Final  . Monocytes Absolute 01/18/2013 0.6  0.1 - 1.0 K/uL Final  . Eosinophils Relative 01/18/2013 2  0 - 5 % Final  . Eosinophils Absolute 01/18/2013 0.1  0.0 - 0.7 K/uL Final  . Basophils Relative 01/18/2013 0  0 - 1 % Final  . Basophils Absolute 01/18/2013 0.0  0.0 - 0.1 K/uL Final  . Sodium 01/18/2013 135* 137 - 147 mEq/L Final   Please note change in reference range.  . Potassium 01/18/2013 3.7  3.7 - 5.3 mEq/L Final   Please note change in  reference range.  . Chloride 01/18/2013 96  96 - 112 mEq/L Final  . CO2 01/18/2013 26  19 - 32 mEq/L Final  . Glucose, Bld 01/18/2013 134* 70 - 99 mg/dL Final  . BUN 01/18/2013 17  6 - 23 mg/dL Final  . Creatinine, Ser 01/18/2013 1.32  0.50 - 1.35 mg/dL Final  . Calcium 01/18/2013 9.5  8.4 - 10.5 mg/dL Final  . GFR calc non Af Amer 01/18/2013 57* >90 mL/min Final  . GFR calc Af Amer 01/18/2013 66* >90 mL/min Final   Comment: (NOTE)                          The eGFR has been calculated using the CKD EPI equation.                          This calculation has not been validated in all clinical situations.                          eGFR's persistently <90 mL/min signify possible Chronic Kidney                          Disease.     Assessment/Plan 1. Hypertension controlled  2. Type II or unspecified type diabetes mellitus without mention of complication, uncontrolled controlled  3. Obesity, unspecified Encouraged weight loss and resumption of regular exercising  4. Malignant neoplasm of thyroid gland Stable. Low TSH is consistent with   5. Edema Painless swelling of the left leg. I suspect this is a venous problem. Possibly dissecting popliteal cyst. -MRI left leg  6. Hyperlipidemia controlled  7. Unspecified sleep apnea stable  8. Immunization due - zoster vaccine live, PF, (ZOSTAVAX) 51761 UNT/0.65ML injection; Inject 19,400 Units into the skin once.  Dispense: 1 each; Refill: 0

## 2013-04-15 NOTE — Patient Instructions (Addendum)
Try Myoflex or Aspercreme to the painful area on your left side.

## 2013-04-16 ENCOUNTER — Encounter: Payer: Self-pay | Admitting: Internal Medicine

## 2013-04-17 ENCOUNTER — Ambulatory Visit
Admission: RE | Admit: 2013-04-17 | Discharge: 2013-04-17 | Disposition: A | Discharge status: Home / Self Care | Payer: BC Managed Care – PPO | Source: Ambulatory Visit | Attending: Internal Medicine | Admitting: Internal Medicine

## 2013-04-17 ENCOUNTER — Other Ambulatory Visit: Payer: Self-pay | Admitting: Internal Medicine

## 2013-04-17 DIAGNOSIS — R609 Edema, unspecified: Secondary | ICD-10-CM

## 2013-05-07 ENCOUNTER — Encounter: Payer: Self-pay | Admitting: Internal Medicine

## 2013-05-07 ENCOUNTER — Ambulatory Visit (INDEPENDENT_AMBULATORY_CARE_PROVIDER_SITE_OTHER): Payer: BC Managed Care – PPO | Admitting: Internal Medicine

## 2013-05-07 VITALS — BP 130/80 | HR 72 | Temp 97.5°F | Resp 18 | Ht 74.0 in | Wt 245.2 lb

## 2013-05-07 DIAGNOSIS — R079 Chest pain, unspecified: Secondary | ICD-10-CM

## 2013-05-07 DIAGNOSIS — C73 Malignant neoplasm of thyroid gland: Secondary | ICD-10-CM

## 2013-05-07 DIAGNOSIS — R609 Edema, unspecified: Secondary | ICD-10-CM

## 2013-05-07 NOTE — Progress Notes (Signed)
Patient ID: Mitchell Gibbs, male   DOB: 12-Jul-1950, 63 y.o.   MRN: [id number]    Location:  [facility]   Place of Service: OFFICE    No Known Allergies  Chief Complaint  Patient presents with  . Acute Visit    rt side pain in shoulder blade area since last  Tuesday    HPI:  For the last week has had pain in the right chest and under the right scapula. No dyspnea, cough, nausea.   Drove to [place] last week. Went about 1700 miles. Used ibuprofen for the pain. Helps for a while, then the pain comes back.  History of thyroid cancer. Currently thought to be in remission.  No sweats or weight loss. Denies increase in dyspnea.  Medications: Patient's Medications  New Prescriptions   No medications on file  Previous Medications   ALBUTEROL (PROVENTIL HFA;VENTOLIN HFA) 108 (90 BASE) MCG/ACT INHALER    Inhale 2 puffs into the lungs every 12 (twelve) hours. For 5 days to help with your breathing   FUROSEMIDE (LASIX) 20 MG TABLET    TAKE 1 TABLET BY MOUTH EVERY DAY AS NEEDED TO CONTROL EDEMA   GLIPIZIDE (GLUCOTROL) 10 MG TABLET    Take 10 mg by mouth 2 (two) times daily before a meal.   GLUCOSE BLOOD (ONE TOUCH TEST STRIPS) TEST STRIP    Use to check blood sugar twice a day for diabetes Dx. 250.00   GUAIFENESIN (ROBITUSSIN) 100 MG/5ML LIQUID    Take 200 mg by mouth 3 (three) times daily as needed for cough.   INSULIN GLARGINE (LANTUS) 100 UNITS/ML SOLN    Inject 20 units once a day for diabetes 250.00   LEVOTHYROXINE (SYNTHROID, LEVOTHROID) 150 MCG TABLET    Take 1 tablet (150 mcg total) by mouth daily.   LOSARTAN-HYDROCHLOROTHIAZIDE (HYZAAR) 100-25 MG PER TABLET    TAKE 1 TABLET EVERY DAY TO CONTROL BLOOD PRESSURE   LOVASTATIN (MEVACOR) 40 MG TABLET    TAKE 1 TABLET DAILY TO CONTROL CHOLESTEROL   METFORMIN (GLUCOPHAGE) 1000 MG TABLET    Take 1,000 mg by mouth daily with breakfast.   TADALAFIL (CIALIS) 20 MG TABLET    Take 1/2 to 1 tablet prior to intercourse to help erectile dysfunction   ZOSTER VACCINE LIVE, PF, (ZOSTAVAX) 16109 UNT/0.65ML INJECTION    Inject 19,400 Units into the skin once.  Modified Medications   No medications on file  Discontinued Medications   No medications on file     Review of Systems  Constitutional: Negative.   HENT: Negative.   Eyes: Negative.   Respiratory:       Tender right chest anterior and laterally  Cardiovascular: Negative for chest pain, palpitations and leg swelling.  Gastrointestinal: Negative for abdominal pain, abdominal distention and anal bleeding.  Endocrine: Negative for cold intolerance, heat intolerance, polydipsia, polyphagia and polyuria.       Hx thyroid malignancy and of DM.  Genitourinary: Negative.   Musculoskeletal:       Swollen left leg  Skin: Negative.   Allergic/Immunologic: Negative.   Neurological: Negative.   Hematological: Negative.   Psychiatric/Behavioral: Negative.     Filed Vitals:   05/07/13 1225  BP: 130/80  Pulse: 72  Temp: 97.5 F (36.4 C)  TempSrc: Oral  Resp: 18  Height: 6\' 2"  (1.88 m)  Weight: 245 lb 3.2 oz (111.222 kg)  SpO2: 98%   Physical Exam  Constitutional: He is oriented to person, place, and time. He appears well-developed.  No distress.  overweight  HENT:  Right Ear: External ear normal.  Left Ear: External ear normal.  Nose: Nose normal.  Eyes: Conjunctivae and EOM are normal. Pupils are equal, round, and reactive to light.  Neck: Normal range of motion. Neck supple. No JVD present. No tracheal deviation present. No thyromegaly present.  Cardiovascular: Normal rate, regular rhythm, normal heart sounds and intact distal pulses.  Exam reveals no gallop and no friction rub.   No murmur heard. Pulmonary/Chest: No respiratory distress. He exhibits tenderness.  Tender right chest anteriorally and posteriorally about the 6th rib. No rales or rhonchi.  Abdominal: He exhibits no distension and no mass. There is no tenderness.  Musculoskeletal: He exhibits no edema.  Left  leg trace edema.  Lymphadenopathy:    He has no cervical adenopathy.  Neurological: He is alert and oriented to person, place, and time. He has normal reflexes. No cranial nerve deficit.  Skin: No rash noted. No erythema. No pallor.  Psychiatric: He has a normal mood and affect. His behavior is normal. Judgment and thought content normal.     Labs reviewed: Appointment on 03/06/2013  Component Date Value Ref Range Status  . Cholesterol, Total 03/06/2013 142  100 - 199 mg/dL Final  . Triglycerides 03/06/2013 46  0 - 149 mg/dL Final  . HDL 03/06/2013 61  >39 mg/dL Final   Comment: According to ATP-III Guidelines, HDL-C >59 mg/dL is considered a                          negative risk factor for CHD.  Marland Kitchen VLDL Cholesterol Cal 03/06/2013 9  5 - 40 mg/dL Final  . LDL Calculated 03/06/2013 72  0 - 99 mg/dL Final  . Chol/HDL Ratio 03/06/2013 2.3  0.0 - 5.0 ratio units Final   Comment:                                   T. Chol/HDL Ratio                                                                      Men  Women                                                        1/2 Avg.Risk  3.4    3.3                                                            Avg.Risk  5.0    4.4  2X Avg.Risk  9.6    7.1                                                         3X Avg.Risk 23.4   11.0  . Hemoglobin A1C 03/06/2013 7.3* 4.8 - 5.6 % Final   Comment:          Increased risk for diabetes: 5.7 - 6.4                                   Diabetes: >6.4                                   Glycemic control for adults with diabetes: <7.0  . Estimated average glucose 03/06/2013 163   Final  . Glucose 03/06/2013 115* 65 - 99 mg/dL Final  . BUN 03/06/2013 18  8 - 27 mg/dL Final  . Creatinine, Ser 03/06/2013 1.30* 0.76 - 1.27 mg/dL Final  . GFR calc non Af Amer 03/06/2013 59* >59 mL/min/1.73 Final  . GFR calc Af Amer 03/06/2013 69  >59 mL/min/1.73 Final  .  BUN/Creatinine Ratio 03/06/2013 14  10 - 22 Final  . Sodium 03/06/2013 142  134 - 144 mmol/L Final  . Potassium 03/06/2013 3.9  3.5 - 5.2 mmol/L Final  . Chloride 03/06/2013 101  97 - 108 mmol/L Final  . CO2 03/06/2013 26  18 - 29 mmol/L Final  . Calcium 03/06/2013 10.0  8.6 - 10.2 mg/dL Final  . PSA 03/06/2013 0.5  0.0 - 4.0 ng/mL Final   Comment: Roche ECLIA methodology.                          According to the [employer] Association, Serum PSA should                          decrease and remain at undetectable levels after radical                          prostatectomy. The AUA defines biochemical recurrence as an initial                          PSA value 0.2 ng/mL or greater followed by a subsequent confirmatory                          PSA value 0.2 ng/mL or greater.                          Values obtained with different assay methods or kits cannot be used                          interchangeably. Results cannot be interpreted as absolute evidence                          of the presence or absence of malignant disease.  Marland Kitchen TSH  03/06/2013 0.270* 0.450 - 4.500 uIU/mL Final      Assessment/Plan  1. Chest pain, unspecified - NM Bone Scan Whole Body; Future - DG Chest 2 View; Future - DG Ribs Unilateral Right; Future  2. Edema Left leg is unchanged  3. Malignant neoplasm of thyroid gland Low potential for metastases, but testing needs to be done - NM Bone Scan Whole Body; Future

## 2013-05-07 NOTE — Patient Instructions (Signed)
-  continue current medications

## 2013-05-22 ENCOUNTER — Encounter (HOSPITAL_COMMUNITY)
Admission: RE | Admit: 2013-05-22 | Discharge: 2013-05-22 | Disposition: A | Discharge status: Home / Self Care | Payer: BC Managed Care – PPO | Source: Ambulatory Visit | Attending: Internal Medicine | Admitting: Internal Medicine

## 2013-05-22 DIAGNOSIS — M25519 Pain in unspecified shoulder: Secondary | ICD-10-CM | POA: Insufficient documentation

## 2013-05-22 DIAGNOSIS — C73 Malignant neoplasm of thyroid gland: Secondary | ICD-10-CM

## 2013-05-22 DIAGNOSIS — R079 Chest pain, unspecified: Secondary | ICD-10-CM | POA: Insufficient documentation

## 2013-05-22 DIAGNOSIS — Z85850 Personal history of malignant neoplasm of thyroid: Secondary | ICD-10-CM | POA: Insufficient documentation

## 2013-05-22 MED ORDER — TECHNETIUM TC 99M MEDRONATE IV KIT
26.3000 | PACK | Freq: Once | INTRAVENOUS | Status: AC | PRN
  Administered 2013-05-22: 26.3 via INTRAVENOUS

## 2013-05-23 ENCOUNTER — Other Ambulatory Visit: Payer: Self-pay | Admitting: Internal Medicine

## 2013-05-23 NOTE — Telephone Encounter (Signed)
Will discuss when he calls regarding lab results

## 2013-06-06 ENCOUNTER — Other Ambulatory Visit: Payer: BC Managed Care – PPO

## 2013-06-06 DIAGNOSIS — E1165 Type 2 diabetes mellitus with hyperglycemia: Principal | ICD-10-CM

## 2013-06-06 DIAGNOSIS — IMO0001 Reserved for inherently not codable concepts without codable children: Secondary | ICD-10-CM

## 2013-06-06 DIAGNOSIS — I10 Essential (primary) hypertension: Secondary | ICD-10-CM

## 2013-06-06 DIAGNOSIS — C73 Malignant neoplasm of thyroid gland: Secondary | ICD-10-CM

## 2013-06-07 LAB — COMPREHENSIVE METABOLIC PANEL
ALBUMIN: 4 g/dL (ref 3.6–4.8)
ALT: 14 IU/L (ref 0–44)
AST: 21 IU/L (ref 0–40)
Albumin/Globulin Ratio: 1.4 (ref 1.1–2.5)
Alkaline Phosphatase: 72 IU/L (ref 39–117)
BILIRUBIN TOTAL: 0.3 mg/dL (ref 0.0–1.2)
BUN/Creatinine Ratio: 11 (ref 10–22)
BUN: 15 mg/dL (ref 8–27)
CHLORIDE: 99 mmol/L (ref 97–108)
CO2: 23 mmol/L (ref 18–29)
Calcium: 9.4 mg/dL (ref 8.6–10.2)
Creatinine, Ser: 1.36 mg/dL — ABNORMAL HIGH (ref 0.76–1.27)
GFR calc Af Amer: 65 mL/min/{1.73_m2} (ref >59)
GFR calc non Af Amer: 56 mL/min/{1.73_m2} — ABNORMAL LOW (ref >59)
Globulin, Total: 2.8 g/dL (ref 1.5–4.5)
Glucose: 116 mg/dL — ABNORMAL HIGH (ref 65–99)
POTASSIUM: 3.8 mmol/L (ref 3.5–5.2)
SODIUM: 139 mmol/L (ref 134–144)
TOTAL PROTEIN: 6.8 g/dL (ref 6.0–8.5)

## 2013-06-07 LAB — LIPID PANEL
Chol/HDL Ratio: 2.4 ratio units (ref 0.0–5.0)
Cholesterol, Total: 138 mg/dL (ref 100–199)
HDL: 57 mg/dL (ref >39)
LDL CALC: 72 mg/dL (ref 0–99)
TRIGLYCERIDES: 43 mg/dL (ref 0–149)
VLDL Cholesterol Cal: 9 mg/dL (ref 5–40)

## 2013-06-07 LAB — TSH: TSH: 0.226 u[IU]/mL — ABNORMAL LOW (ref 0.450–4.500)

## 2013-06-07 LAB — HEMOGLOBIN A1C
Est. average glucose Bld gHb Est-mCnc: 177 mg/dL
HEMOGLOBIN A1C: 7.8 % — AB (ref 4.8–5.6)

## 2013-06-07 LAB — MICROALBUMIN, URINE: MICROALBUM., U, RANDOM: 21.3 ug/mL — AB (ref 0.0–17.0)

## 2013-06-11 ENCOUNTER — Encounter: Payer: Self-pay | Admitting: Internal Medicine

## 2013-06-11 ENCOUNTER — Ambulatory Visit (INDEPENDENT_AMBULATORY_CARE_PROVIDER_SITE_OTHER): Payer: BC Managed Care – PPO | Admitting: Internal Medicine

## 2013-06-11 VITALS — BP 118/86 | HR 56 | Temp 97.9°F | Resp 18 | Ht 74.0 in | Wt 245.8 lb

## 2013-06-11 DIAGNOSIS — C73 Malignant neoplasm of thyroid gland: Secondary | ICD-10-CM

## 2013-06-11 DIAGNOSIS — E669 Obesity, unspecified: Secondary | ICD-10-CM

## 2013-06-11 DIAGNOSIS — E1165 Type 2 diabetes mellitus with hyperglycemia: Principal | ICD-10-CM

## 2013-06-11 DIAGNOSIS — I10 Essential (primary) hypertension: Secondary | ICD-10-CM

## 2013-06-11 DIAGNOSIS — R079 Chest pain, unspecified: Secondary | ICD-10-CM

## 2013-06-11 DIAGNOSIS — IMO0001 Reserved for inherently not codable concepts without codable children: Secondary | ICD-10-CM

## 2013-06-11 DIAGNOSIS — E785 Hyperlipidemia, unspecified: Secondary | ICD-10-CM

## 2013-06-11 DIAGNOSIS — R609 Edema, unspecified: Secondary | ICD-10-CM

## 2013-06-11 MED ORDER — LINAGLIPTIN 5 MG PO TABS: ORAL_TABLET | ORAL | Status: DC

## 2013-06-11 NOTE — Progress Notes (Signed)
Patient ID: Mitchell Gibbs, male   DOB: 06/25/1950, 63 y.o.   MRN: [id number]    Location:  [facility]  Place of Service: OFFICE   No Known Allergies  Chief Complaint  Patient presents with  . Follow-up    labs, NM body scan    HPI:  Type II or unspecified type diabetes mellitus without mention of complication, uncontrolled - he is trying to lose weight. Diet he describes is a good diabetic diet. A1c continues to rise. He has been out of town in [place] because of the illness and death of his wife's brother.  Hypertension: controlled  Malignant neoplasm of thyroid gland: on chronic suppression surgery. TSH runs low because of this. Denies discomfort in neck, palpitations, diaphoresis.  Edema: left leg. improving  Chest pain, unspecified: resolved. NM body scan showed hypermetabolic area in the left forehead, but he has no symptoms referable to this area. Ribs and chest were negative,  Hyperlipidemia: well controlled  Obesity, unspecified: continues to improve diet    Medications: Patient's Medications  New Prescriptions   No medications on file  Previous Medications   ALBUTEROL (PROVENTIL HFA;VENTOLIN HFA) 108 (90 BASE) MCG/ACT INHALER    Inhale 2 puffs into the lungs every 12 (twelve) hours. For 5 days to help with your breathing   FUROSEMIDE (LASIX) 20 MG TABLET    TAKE 1 TABLET BY MOUTH EVERY DAY AS NEEDED TO CONTROL EDEMA   GLIPIZIDE (GLUCOTROL) 10 MG TABLET    Take 10 mg by mouth 2 (two) times daily before a meal.   GLUCOSE BLOOD (ONE TOUCH TEST STRIPS) TEST STRIP    Use to check blood sugar twice a day for diabetes Dx. 250.00   GUAIFENESIN (ROBITUSSIN) 100 MG/5ML LIQUID    Take 200 mg by mouth 3 (three) times daily as needed for cough.   INSULIN GLARGINE (LANTUS) 100 UNITS/ML SOLN    Inject 20 units once a day for diabetes 250.00   LEVOTHYROXINE (SYNTHROID, LEVOTHROID) 150 MCG TABLET    Take 1 tablet (150 mcg total) by mouth daily.   LOSARTAN-HYDROCHLOROTHIAZIDE (HYZAAR)  100-25 MG PER TABLET    TAKE 1 TABLET EVERY DAY TO CONTROL BLOOD PRESSURE   LOVASTATIN (MEVACOR) 40 MG TABLET    TAKE 1 TABLET DAILY TO CONTROL CHOLESTEROL   METFORMIN (GLUCOPHAGE) 1000 MG TABLET    Take 1,000 mg by mouth daily with breakfast.   TADALAFIL (CIALIS) 20 MG TABLET    Take 1/2 to 1 tablet prior to intercourse to help erectile dysfunction   ZOSTER VACCINE LIVE, PF, (ZOSTAVAX) 40973 UNT/0.65ML INJECTION    Inject 19,400 Units into the skin once.  Modified Medications   No medications on file  Discontinued Medications   No medications on file     Review of Systems  Constitutional: Negative.   HENT: Negative.   Eyes: Negative.   Respiratory: Negative.   Cardiovascular: Negative for chest pain, palpitations and leg swelling.  Gastrointestinal: Negative for abdominal pain, abdominal distention and anal bleeding.  Endocrine: Negative for cold intolerance, heat intolerance, polydipsia, polyphagia and polyuria.       Hx thyroid malignancy and of DM.  Genitourinary: Negative.   Musculoskeletal:       Swollen left leg  Skin: Negative.   Allergic/Immunologic: Negative.   Neurological: Negative.   Hematological: Negative.   Psychiatric/Behavioral: Negative.     Filed Vitals:   06/11/13 1613  BP: 118/86  Pulse: 56  Temp: 97.9 F (36.6 C)  TempSrc: Oral  Resp: 18  Height: 6\' 2"  (1.88 m)  Weight: 245 lb 12.8 oz (111.494 kg)  SpO2: 99%   Body mass index is 31.55 kg/(m^2).  Physical Exam  Constitutional: He is oriented to person, place, and time. He appears well-developed. No distress.  overweight  HENT:  Right Ear: External ear normal.  Left Ear: External ear normal.  Nose: Nose normal.  Eyes: Conjunctivae and EOM are normal. Pupils are equal, round, and reactive to light.  Neck: Normal range of motion. Neck supple. No JVD present. No tracheal deviation present. No thyromegaly present.  Cardiovascular: Normal rate, regular rhythm, normal heart sounds and intact distal  pulses.  Exam reveals no gallop and no friction rub.   No murmur heard. Pulmonary/Chest: No respiratory distress. He has no wheezes. He has no rales. He exhibits no tenderness.  Abdominal: He exhibits no distension and no mass. There is no tenderness.  Musculoskeletal: He exhibits no edema.  Left leg trace edema.  Lymphadenopathy:    He has no cervical adenopathy.  Neurological: He is alert and oriented to person, place, and time. He has normal reflexes. No cranial nerve deficit.  Skin: No rash noted. No erythema. No pallor.  Psychiatric: He has a normal mood and affect. His behavior is normal. Judgment and thought content normal.     Labs reviewed: Appointment on 06/06/2013  Component Date Value Ref Range Status  . Hemoglobin A1C 06/06/2013 7.8* 4.8 - 5.6 % Final   Comment:          Increased risk for diabetes: 5.7 - 6.4                                   Diabetes: >6.4                                   Glycemic control for adults with diabetes: <7.0  . Estimated average glucose 06/06/2013 177   Final  . Glucose 06/06/2013 116* 65 - 99 mg/dL Final  . BUN 06/06/2013 15  8 - 27 mg/dL Final  . Creatinine, Ser 06/06/2013 1.36* 0.76 - 1.27 mg/dL Final  . GFR calc non Af Amer 06/06/2013 56* >59 mL/min/1.73 Final  . GFR calc Af Amer 06/06/2013 65  >59 mL/min/1.73 Final  . BUN/Creatinine Ratio 06/06/2013 11  10 - 22 Final  . Sodium 06/06/2013 139  134 - 144 mmol/L Final  . Potassium 06/06/2013 3.8  3.5 - 5.2 mmol/L Final  . Chloride 06/06/2013 99  97 - 108 mmol/L Final  . CO2 06/06/2013 23  18 - 29 mmol/L Final  . Calcium 06/06/2013 9.4  8.6 - 10.2 mg/dL Final  . Total Protein 06/06/2013 6.8  6.0 - 8.5 g/dL Final  . Albumin 06/06/2013 4.0  3.6 - 4.8 g/dL Final  . Globulin, Total 06/06/2013 2.8  1.5 - 4.5 g/dL Final  . Albumin/Globulin Ratio 06/06/2013 1.4  1.1 - 2.5 Final  . Total Bilirubin 06/06/2013 0.3  0.0 - 1.2 mg/dL Final  . Alkaline Phosphatase 06/06/2013 72  39 - 117 IU/L Final    . AST 06/06/2013 21  0 - 40 IU/L Final  . ALT 06/06/2013 14  0 - 44 IU/L Final  . Cholesterol, Total 06/06/2013 138  100 - 199 mg/dL Final  . Triglycerides 06/06/2013 43  0 - 149 mg/dL Final  . HDL 06/06/2013 57  >  39 mg/dL Final   Comment: According to ATP-III Guidelines, HDL-C >59 mg/dL is considered a                          negative risk factor for CHD.  Marland Kitchen VLDL Cholesterol Cal 06/06/2013 9  5 - 40 mg/dL Final  . LDL Calculated 06/06/2013 72  0 - 99 mg/dL Final  . Chol/HDL Ratio 06/06/2013 2.4  0.0 - 5.0 ratio units Final   Comment:                                   T. Chol/HDL Ratio                                                                      Men  Women                                                        1/2 Avg.Risk  3.4    3.3                                                            Avg.Risk  5.0    4.4                                                         2X Avg.Risk  9.6    7.1                                                         3X Avg.Risk 23.4   11.0  . TSH 06/06/2013 0.226* 0.450 - 4.500 uIU/mL Final  . Microalbum.,U,Random 06/06/2013 21.3* 0.0 - 17.0 ug/mL Final      Assessment/Plan  1. Type II or unspecified type diabetes mellitus without mention of complication, uncontrolled Discontinue glipizide. Continue metformin. Start linagliptin (TRADJENTA) 5 MG TABS tablet; One daily to control diabetes  Dispense: 30 tablet; Refill: 5 - Basic metabolic panel; Future - Hemoglobin A1c; Future  2. Hypertension controlled  3. Malignant neoplasm of thyroid gland - TSH; Future  4. Edema Left leg. A little improved  5. Chest pain, unspecified resolved  6. Hyperlipidemia controlled  7. Obesity, unspecified Continue to attempt to lose weight

## 2013-06-11 NOTE — Patient Instructions (Signed)
Start new medication Tradjenta. Stop glipizide.

## 2013-06-27 ENCOUNTER — Telehealth: Payer: Self-pay

## 2013-06-27 NOTE — Telephone Encounter (Signed)
Left message on voicemail for patient to return call when available    Reason for Call: Form received from [facility] for compounding medication services. We need to verify that patient requested compounding medication and if yes for what reason.   Location: Form is currently located in the triage area

## 2013-06-27 NOTE — Telephone Encounter (Signed)
Patient called back indicating that he did request compounding medication for generalized joint and muscle pain. Form was placed under Dr.Green's mat for completion

## 2013-07-02 ENCOUNTER — Other Ambulatory Visit: Payer: Self-pay | Admitting: Internal Medicine

## 2013-07-09 ENCOUNTER — Other Ambulatory Visit: Payer: Self-pay | Admitting: *Deleted

## 2013-07-09 DIAGNOSIS — E1165 Type 2 diabetes mellitus with hyperglycemia: Principal | ICD-10-CM

## 2013-07-09 DIAGNOSIS — IMO0001 Reserved for inherently not codable concepts without codable children: Secondary | ICD-10-CM

## 2013-07-09 MED ORDER — LINAGLIPTIN 5 MG PO TABS: ORAL_TABLET | ORAL | Status: DC

## 2013-07-09 NOTE — Telephone Encounter (Signed)
Patient requested. Rx faxed to pharmacy.  

## 2013-07-27 ENCOUNTER — Other Ambulatory Visit: Payer: Self-pay | Admitting: Internal Medicine

## 2013-08-04 ENCOUNTER — Other Ambulatory Visit: Payer: Self-pay | Admitting: *Deleted

## 2013-08-04 MED ORDER — INSULIN GLARGINE 100 UNITS/ML SOLOSTAR PEN: PEN_INJECTOR | SUBCUTANEOUS | Status: DC

## 2013-08-04 NOTE — Telephone Encounter (Signed)
Patient Requested 

## 2013-08-11 ENCOUNTER — Other Ambulatory Visit: Payer: BC Managed Care – PPO

## 2013-08-11 DIAGNOSIS — E1165 Type 2 diabetes mellitus with hyperglycemia: Principal | ICD-10-CM

## 2013-08-11 DIAGNOSIS — I10 Essential (primary) hypertension: Secondary | ICD-10-CM

## 2013-08-11 DIAGNOSIS — C73 Malignant neoplasm of thyroid gland: Secondary | ICD-10-CM

## 2013-08-11 DIAGNOSIS — IMO0001 Reserved for inherently not codable concepts without codable children: Secondary | ICD-10-CM

## 2013-08-12 LAB — MICROALBUMIN / CREATININE URINE RATIO
Creatinine, Ur: 166.8 mg/dL (ref 22.0–328.0)
MICROALB/CREAT RATIO: 3.5 mg/g{creat} (ref 0.0–30.0)
Microalbumin, Urine: 5.9 ug/mL (ref 0.0–17.0)

## 2013-08-12 LAB — COMPREHENSIVE METABOLIC PANEL
ALBUMIN: 3.8 g/dL (ref 3.6–4.8)
ALK PHOS: 64 IU/L (ref 39–117)
ALT: 19 IU/L (ref 0–44)
AST: 25 IU/L (ref 0–40)
Albumin/Globulin Ratio: 1.4 (ref 1.1–2.5)
BILIRUBIN TOTAL: 0.3 mg/dL (ref 0.0–1.2)
BUN / CREAT RATIO: 12 (ref 10–22)
BUN: 15 mg/dL (ref 8–27)
CO2: 23 mmol/L (ref 18–29)
CREATININE: 1.27 mg/dL (ref 0.76–1.27)
Calcium: 9.3 mg/dL (ref 8.6–10.2)
Chloride: 99 mmol/L (ref 97–108)
GFR calc Af Amer: 71 mL/min/{1.73_m2} (ref >59)
GFR, EST NON AFRICAN AMERICAN: 61 mL/min/{1.73_m2} (ref >59)
GLOBULIN, TOTAL: 2.7 g/dL (ref 1.5–4.5)
Glucose: 106 mg/dL — ABNORMAL HIGH (ref 65–99)
Potassium: 3.8 mmol/L (ref 3.5–5.2)
Sodium: 139 mmol/L (ref 134–144)
Total Protein: 6.5 g/dL (ref 6.0–8.5)

## 2013-08-12 LAB — HEMOGLOBIN A1C
Est. average glucose Bld gHb Est-mCnc: 169 mg/dL
Hgb A1c MFr Bld: 7.5 % — ABNORMAL HIGH (ref 4.8–5.6)

## 2013-08-12 LAB — TSH: TSH: 0.186 u[IU]/mL — ABNORMAL LOW (ref 0.450–4.500)

## 2013-08-13 ENCOUNTER — Ambulatory Visit (INDEPENDENT_AMBULATORY_CARE_PROVIDER_SITE_OTHER): Payer: BC Managed Care – PPO | Admitting: Internal Medicine

## 2013-08-13 ENCOUNTER — Encounter: Payer: Self-pay | Admitting: Internal Medicine

## 2013-08-13 VITALS — BP 124/80 | HR 67 | Temp 98.1°F | Wt 239.2 lb

## 2013-08-13 DIAGNOSIS — E785 Hyperlipidemia, unspecified: Secondary | ICD-10-CM

## 2013-08-13 DIAGNOSIS — IMO0001 Reserved for inherently not codable concepts without codable children: Secondary | ICD-10-CM

## 2013-08-13 DIAGNOSIS — I10 Essential (primary) hypertension: Secondary | ICD-10-CM

## 2013-08-13 DIAGNOSIS — C73 Malignant neoplasm of thyroid gland: Secondary | ICD-10-CM

## 2013-08-13 DIAGNOSIS — R609 Edema, unspecified: Secondary | ICD-10-CM

## 2013-08-13 DIAGNOSIS — E669 Obesity, unspecified: Secondary | ICD-10-CM

## 2013-08-13 DIAGNOSIS — Z23 Encounter for immunization: Secondary | ICD-10-CM

## 2013-08-13 DIAGNOSIS — E1165 Type 2 diabetes mellitus with hyperglycemia: Secondary | ICD-10-CM

## 2013-08-13 MED ORDER — ZOSTER VACCINE LIVE 19400 UNT/0.65ML ~~LOC~~ SOLR: 0.6500 mL | Freq: Once | SUBCUTANEOUS | Status: DC

## 2013-08-13 NOTE — Progress Notes (Signed)
Patient ID: Mitchell Gibbs, male   DOB: 09/26/1950, 63 y.o.   MRN: [id number]    Location:    [facility]  Place of Service:  OFFICE    No Known Allergies  Chief Complaint  Patient presents with  . Follow-up    4 month f/u & discuss labs, fall screening neg.  . other    dry skin on LT big toe & swollen as well x 2 days.    HPI:  Malignant neoplasm of thyroid gland -no evidence for reoccurrence. On chronic suppression therapy and remains mildly hyperthyroid at current levothyroxin dose.  Type II or unspecified type diabetes mellitus without mention of complication, uncontrolled - improved Obesity, unspecified  Essential hypertension - controlled  Edema: chronic in left leg. Unchanged  Immunization due - Plan: zoster vaccine live, PF, (ZOSTAVAX) 72094 UNT/0.65ML injection  Hyperlipidemia - follow up next time    Medications: Patient's Medications  New Prescriptions   No medications on file  Previous Medications   ALBUTEROL (PROVENTIL HFA;VENTOLIN HFA) 108 (90 BASE) MCG/ACT INHALER    Inhale 2 puffs into the lungs every 12 (twelve) hours. For 5 days to help with your breathing   FUROSEMIDE (LASIX) 20 MG TABLET    TAKE 1 TABLET BY MOUTH EVERY DAY AS NEEDED TO CONTROL EDEMA   GLUCOSE BLOOD (ONE TOUCH TEST STRIPS) TEST STRIP    Use to check blood sugar twice a day for diabetes Dx. 250.00   GUAIFENESIN (ROBITUSSIN) 100 MG/5ML LIQUID    Take 200 mg by mouth 3 (three) times daily as needed for cough.   INSULIN GLARGINE (LANTUS) 100 UNIT/ML SOPN    Inject 20 units once a day for diabetes 250.00   LEVOTHYROXINE (SYNTHROID, LEVOTHROID) 150 MCG TABLET    Take 1 tablet (150 mcg total) by mouth daily.   LINAGLIPTIN (TRADJENTA) 5 MG TABS TABLET    Take One tablet once daily to control diabetes   LOSARTAN-HYDROCHLOROTHIAZIDE (HYZAAR) 100-25 MG PER TABLET    TAKE 1 TABLET EVERY DAY TO CONTROL BLOOD PRESSURE   LOVASTATIN (MEVACOR) 40 MG TABLET    TAKE 1 TABLET DAILY TO CONTROL CHOLESTEROL   METFORMIN (GLUCOPHAGE) 1000 MG TABLET    Take 1,000 mg by mouth daily with breakfast.   TADALAFIL (CIALIS) 20 MG TABLET    Take 1/2 to 1 tablet prior to intercourse to help erectile dysfunction  Modified Medications   Modified Medication Previous Medication   ZOSTER VACCINE LIVE, PF, (ZOSTAVAX) 70962 UNT/0.65ML INJECTION zoster vaccine live, PF, (ZOSTAVAX) 83662 UNT/0.65ML injection      Inject 19,400 Units into the skin once.    Inject 19,400 Units into the skin once.  Discontinued Medications   No medications on file     Review of Systems  Constitutional: Negative.   HENT: Negative.   Eyes: Negative.   Respiratory: Negative.   Cardiovascular: Positive for leg swelling (mainly the left leg). Negative for chest pain and palpitations.  Gastrointestinal: Negative for abdominal pain, abdominal distention and anal bleeding.  Endocrine: Negative for cold intolerance, heat intolerance, polydipsia, polyphagia and polyuria.       Hx thyroid malignancy and of DM.  Genitourinary: Negative.   Musculoskeletal:       Swollen left leg  Skin: Negative.   Allergic/Immunologic: Negative.   Neurological: Negative.   Hematological: Negative.   Psychiatric/Behavioral: Negative.     Filed Vitals:   08/13/13 1601  BP: 124/80  Pulse: 67  Temp: 98.1 F (36.7 C)  TempSrc: Oral  Weight: 239 lb 3.2 oz (108.5 kg)  SpO2: 99%   Body mass index is 30.7 kg/(m^2).  Physical Exam  Constitutional: He is oriented to person, place, and time. He appears well-developed. No distress.  overweight  HENT:  Right Ear: External ear normal.  Left Ear: External ear normal.  Nose: Nose normal.  Eyes: Conjunctivae and EOM are normal. Pupils are equal, round, and reactive to light.  Neck: Normal range of motion. Neck supple. No JVD present. No tracheal deviation present. No thyromegaly present.  Cardiovascular: Normal rate, regular rhythm, normal heart sounds and intact distal pulses.  Exam reveals no gallop and no  friction rub.   No murmur heard. Pulmonary/Chest: No respiratory distress. He has no wheezes. He has no rales. He exhibits no tenderness.  Abdominal: He exhibits no distension and no mass. There is no tenderness.  Musculoskeletal: He exhibits edema (1-2 + left leg).  Left leg trace edema.  Lymphadenopathy:    He has no cervical adenopathy.  Neurological: He is alert and oriented to person, place, and time. He has normal reflexes. No cranial nerve deficit.  Skin: No rash noted. No erythema. No pallor.  Psychiatric: He has a normal mood and affect. His behavior is normal. Judgment and thought content normal.     Labs reviewed: Appointment on 08/11/2013  Component Date Value Ref Range Status  . Hemoglobin A1C 08/11/2013 7.5* 4.8 - 5.6 % Final   Comment:          Increased risk for diabetes: 5.7 - 6.4                                   Diabetes: >6.4                                   Glycemic control for adults with diabetes: <7.0  . Estimated average glucose 08/11/2013 169   Final  . Glucose 08/11/2013 106* 65 - 99 mg/dL Final  . BUN 08/11/2013 15  8 - 27 mg/dL Final  . Creatinine, Ser 08/11/2013 1.27  0.76 - 1.27 mg/dL Final  . GFR calc non Af Amer 08/11/2013 61  >59 mL/min/1.73 Final  . GFR calc Af Amer 08/11/2013 71  >59 mL/min/1.73 Final  . BUN/Creatinine Ratio 08/11/2013 12  10 - 22 Final  . Sodium 08/11/2013 139  134 - 144 mmol/L Final  . Potassium 08/11/2013 3.8  3.5 - 5.2 mmol/L Final  . Chloride 08/11/2013 99  97 - 108 mmol/L Final  . CO2 08/11/2013 23  18 - 29 mmol/L Final  . Calcium 08/11/2013 9.3  8.6 - 10.2 mg/dL Final  . Total Protein 08/11/2013 6.5  6.0 - 8.5 g/dL Final  . Albumin 08/11/2013 3.8  3.6 - 4.8 g/dL Final  . Globulin, Total 08/11/2013 2.7  1.5 - 4.5 g/dL Final  . Albumin/Globulin Ratio 08/11/2013 1.4  1.1 - 2.5 Final  . Total Bilirubin 08/11/2013 0.3  0.0 - 1.2 mg/dL Final  . Alkaline Phosphatase 08/11/2013 64  39 - 117 IU/L Final  . AST 08/11/2013 25  0  - 40 IU/L Final  . ALT 08/11/2013 19  0 - 44 IU/L Final  . TSH 08/11/2013 0.186* 0.450 - 4.500 uIU/mL Final  . Creatinine, Ur 08/11/2013 166.8  22.0 - 328.0 mg/dL Final  . Microalbum.,U,Random 08/11/2013 5.9  0.0 - 17.0 ug/mL Final   **  Verified by repeat analysis**  . MICROALB/CREAT RATIO 08/11/2013 3.5  0.0 - 30.0 mg/g creat Final  Appointment on 06/06/2013  Component Date Value Ref Range Status  . Hemoglobin A1C 06/06/2013 7.8* 4.8 - 5.6 % Final   Comment:          Increased risk for diabetes: 5.7 - 6.4                                   Diabetes: >6.4                                   Glycemic control for adults with diabetes: <7.0  . Estimated average glucose 06/06/2013 177   Final  . Glucose 06/06/2013 116* 65 - 99 mg/dL Final  . BUN 06/06/2013 15  8 - 27 mg/dL Final  . Creatinine, Ser 06/06/2013 1.36* 0.76 - 1.27 mg/dL Final  . GFR calc non Af Amer 06/06/2013 56* >59 mL/min/1.73 Final  . GFR calc Af Amer 06/06/2013 65  >59 mL/min/1.73 Final  . BUN/Creatinine Ratio 06/06/2013 11  10 - 22 Final  . Sodium 06/06/2013 139  134 - 144 mmol/L Final  . Potassium 06/06/2013 3.8  3.5 - 5.2 mmol/L Final  . Chloride 06/06/2013 99  97 - 108 mmol/L Final  . CO2 06/06/2013 23  18 - 29 mmol/L Final  . Calcium 06/06/2013 9.4  8.6 - 10.2 mg/dL Final  . Total Protein 06/06/2013 6.8  6.0 - 8.5 g/dL Final  . Albumin 06/06/2013 4.0  3.6 - 4.8 g/dL Final  . Globulin, Total 06/06/2013 2.8  1.5 - 4.5 g/dL Final  . Albumin/Globulin Ratio 06/06/2013 1.4  1.1 - 2.5 Final  . Total Bilirubin 06/06/2013 0.3  0.0 - 1.2 mg/dL Final  . Alkaline Phosphatase 06/06/2013 72  39 - 117 IU/L Final  . AST 06/06/2013 21  0 - 40 IU/L Final  . ALT 06/06/2013 14  0 - 44 IU/L Final  . Cholesterol, Total 06/06/2013 138  100 - 199 mg/dL Final  . Triglycerides 06/06/2013 43  0 - 149 mg/dL Final  . HDL 06/06/2013 57  >39 mg/dL Final   Comment: According to ATP-III Guidelines, HDL-C >59 mg/dL is considered a                           negative risk factor for CHD.  Marland Kitchen VLDL Cholesterol Cal 06/06/2013 9  5 - 40 mg/dL Final  . LDL Calculated 06/06/2013 72  0 - 99 mg/dL Final  . Chol/HDL Ratio 06/06/2013 2.4  0.0 - 5.0 ratio units Final   Comment:                                   T. Chol/HDL Ratio                                                                      Men  Women  1/2 Avg.Risk  3.4    3.3                                                            Avg.Risk  5.0    4.4                                                         2X Avg.Risk  9.6    7.1                                                         3X Avg.Risk 23.4   11.0  . TSH 06/06/2013 0.226* 0.450 - 4.500 uIU/mL Final  . Microalbum.,U,Random 06/06/2013 21.3* 0.0 - 17.0 ug/mL Final      Assessment/Plan  1. Malignant neoplasm of thyroid gland No evidence for relapse. Continue suppression therapy. - TSH; Future  2. Type II or unspecified type diabetes mellitus without mention of complication, uncontrolled - Hemoglobin A1c; Future - Basic metabolic panel; Future  3. Obesity, unspecified Lost 5# since last visit. He is on a better diet and is walking more.  4. Essential hypertension controlled - Basic metabolic panel; Future  5. Edema Chronic left leg. Associated with venous insufficiency and varicosities.  6. Immunization due - zoster vaccine live, PF, (ZOSTAVAX) 28315 UNT/0.65ML injection; Inject 19,400 Units into the skin once.  Dispense: 1 each; Refill: 0  7. Hyperlipidemia - Lipid panel; Future

## 2013-10-08 ENCOUNTER — Other Ambulatory Visit: Payer: Self-pay | Admitting: *Deleted

## 2013-10-08 DIAGNOSIS — IMO0001 Reserved for inherently not codable concepts without codable children: Secondary | ICD-10-CM

## 2013-10-08 DIAGNOSIS — E1165 Type 2 diabetes mellitus with hyperglycemia: Principal | ICD-10-CM

## 2013-10-08 MED ORDER — LINAGLIPTIN 5 MG PO TABS: ORAL_TABLET | ORAL | Status: DC

## 2013-10-08 NOTE — Telephone Encounter (Signed)
[facility]

## 2013-10-14 ENCOUNTER — Other Ambulatory Visit: Payer: BC Managed Care – PPO

## 2013-10-14 ENCOUNTER — Ambulatory Visit (INDEPENDENT_AMBULATORY_CARE_PROVIDER_SITE_OTHER): Payer: BC Managed Care – PPO | Admitting: Internal Medicine

## 2013-10-14 ENCOUNTER — Encounter: Payer: Self-pay | Admitting: Internal Medicine

## 2013-10-14 VITALS — BP 140/80 | HR 71 | Temp 98.3°F | Resp 18 | Ht 74.0 in | Wt 243.4 lb

## 2013-10-14 DIAGNOSIS — I10 Essential (primary) hypertension: Secondary | ICD-10-CM

## 2013-10-14 DIAGNOSIS — E669 Obesity, unspecified: Secondary | ICD-10-CM

## 2013-10-14 DIAGNOSIS — C73 Malignant neoplasm of thyroid gland: Secondary | ICD-10-CM

## 2013-10-14 DIAGNOSIS — R609 Edema, unspecified: Secondary | ICD-10-CM

## 2013-10-14 DIAGNOSIS — E1165 Type 2 diabetes mellitus with hyperglycemia: Principal | ICD-10-CM

## 2013-10-14 DIAGNOSIS — IMO0001 Reserved for inherently not codable concepts without codable children: Secondary | ICD-10-CM

## 2013-10-14 DIAGNOSIS — E785 Hyperlipidemia, unspecified: Secondary | ICD-10-CM

## 2013-10-14 NOTE — Progress Notes (Signed)
Patient ID: Mitchell Gibbs, male   DOB: [birth year], 63 y.o.   MRN: [id number]      [facility]    Place of Service:   OFFICE    No Known Allergies  Chief Complaint  Patient presents with  . Medical Management of Chronic Issues    HPI:  Malignant neoplasm of thyroid gland: No evidence of relapse. Patient is on chronic suppressive therapy with levothyroxine.  Type II or unspecified type diabetes mellitus without mention of complication, uncontrolled: Dietary compliance has improved. Patient's hemoglobin A1c is falling.  Obesity, unspecified: Patient requesting additional information for weight loss and diabetes management  Essential hypertension: Controlled  Edema: Stable to slightly improved    Medications: Patient's Medications  New Prescriptions   No medications on file  Previous Medications   ALBUTEROL (PROVENTIL HFA;VENTOLIN HFA) 108 (90 BASE) MCG/ACT INHALER    Inhale 2 puffs into the lungs every 12 (twelve) hours. For 5 days to help with your breathing   FUROSEMIDE (LASIX) 20 MG TABLET    TAKE 1 TABLET BY MOUTH EVERY DAY AS NEEDED TO CONTROL EDEMA   GLUCOSE BLOOD (ONE TOUCH TEST STRIPS) TEST STRIP    Use to check blood sugar twice a day for diabetes Dx. 250.00   GUAIFENESIN (ROBITUSSIN) 100 MG/5ML LIQUID    Take 200 mg by mouth 3 (three) times daily as needed for cough.   INSULIN GLARGINE (LANTUS) 100 UNIT/ML SOPN    Inject 20 units once a day for diabetes 250.00   LEVOTHYROXINE (SYNTHROID, LEVOTHROID) 150 MCG TABLET    Take 1 tablet (150 mcg total) by mouth daily.   LINAGLIPTIN (TRADJENTA) 5 MG TABS TABLET    Take One tablet once daily to control diabetes   LOSARTAN-HYDROCHLOROTHIAZIDE (HYZAAR) 100-25 MG PER TABLET    TAKE 1 TABLET EVERY DAY TO CONTROL BLOOD PRESSURE   LOVASTATIN (MEVACOR) 40 MG TABLET    TAKE 1 TABLET DAILY TO CONTROL CHOLESTEROL   METFORMIN (GLUCOPHAGE) 1000 MG TABLET    Take 1,000 mg by mouth daily with breakfast.   TADALAFIL (CIALIS) 20 MG TABLET    Take  1/2 to 1 tablet prior to intercourse to help erectile dysfunction   ZOSTER VACCINE LIVE, PF, (ZOSTAVAX) 14481 UNT/0.65ML INJECTION    Inject 19,400 Units into the skin once.  Modified Medications   No medications on file  Discontinued Medications   No medications on file     Review of Systems  Constitutional: Negative.   HENT: Negative.   Eyes: Negative.   Respiratory: Negative.   Cardiovascular: Positive for leg swelling (mainly the left leg). Negative for chest pain and palpitations.  Gastrointestinal: Negative for abdominal pain, abdominal distention and anal bleeding.  Endocrine: Negative for cold intolerance, heat intolerance, polydipsia, polyphagia and polyuria.       Hx thyroid malignancy and of DM.  Genitourinary: Negative.   Musculoskeletal:       Swollen left leg  Skin: Negative.   Allergic/Immunologic: Negative.   Neurological: Negative.   Hematological: Negative.   Psychiatric/Behavioral: Negative.     Filed Vitals:   10/14/13 1620  BP: 140/80  Pulse: 71  Temp: 98.3 F (36.8 C)  TempSrc: Oral  Resp: 18  Height: 6\' 2"  (1.88 m)  Weight: 243 lb 6.4 oz (110.406 kg)  SpO2: 99%   Body mass index is 31.24 kg/(m^2).  Physical Exam  Constitutional: He is oriented to person, place, and time. He appears well-developed. No distress.  overweight  HENT:  Right Ear: External  ear normal.  Left Ear: External ear normal.  Nose: Nose normal.  Eyes: Conjunctivae and EOM are normal. Pupils are equal, round, and reactive to light.  Neck: Normal range of motion. Neck supple. No JVD present. No tracheal deviation present. No thyromegaly present.  Cardiovascular: Normal rate, regular rhythm, normal heart sounds and intact distal pulses.  Exam reveals no gallop and no friction rub.   No murmur heard. Pulmonary/Chest: No respiratory distress. He has no wheezes. He has no rales. He exhibits no tenderness.  Abdominal: He exhibits no distension and no mass. There is no tenderness.   Musculoskeletal: He exhibits edema (1-2 + left leg).  Left leg trace edema.  Lymphadenopathy:    He has no cervical adenopathy.  Neurological: He is alert and oriented to person, place, and time. He has normal reflexes. No cranial nerve deficit.  Skin: No rash noted. No erythema. No pallor.  Psychiatric: He has a normal mood and affect. His behavior is normal. Judgment and thought content normal.     Labs reviewed: Appointment on 08/11/2013  Component Date Value Ref Range Status  . Hemoglobin A1C 08/11/2013 7.5* 4.8 - 5.6 % Final   Comment:          Increased risk for diabetes: 5.7 - 6.4                                   Diabetes: >6.4                                   Glycemic control for adults with diabetes: <7.0  . Estimated average glucose 08/11/2013 169   Final  . Glucose 08/11/2013 106* 65 - 99 mg/dL Final  . BUN 08/11/2013 15  8 - 27 mg/dL Final  . Creatinine, Ser 08/11/2013 1.27  0.76 - 1.27 mg/dL Final  . GFR calc non Af Amer 08/11/2013 61  >59 mL/min/1.73 Final  . GFR calc Af Amer 08/11/2013 71  >59 mL/min/1.73 Final  . BUN/Creatinine Ratio 08/11/2013 12  10 - 22 Final  . Sodium 08/11/2013 139  134 - 144 mmol/L Final  . Potassium 08/11/2013 3.8  3.5 - 5.2 mmol/L Final  . Chloride 08/11/2013 99  97 - 108 mmol/L Final  . CO2 08/11/2013 23  18 - 29 mmol/L Final  . Calcium 08/11/2013 9.3  8.6 - 10.2 mg/dL Final  . Total Protein 08/11/2013 6.5  6.0 - 8.5 g/dL Final  . Albumin 08/11/2013 3.8  3.6 - 4.8 g/dL Final  . Globulin, Total 08/11/2013 2.7  1.5 - 4.5 g/dL Final  . Albumin/Globulin Ratio 08/11/2013 1.4  1.1 - 2.5 Final  . Total Bilirubin 08/11/2013 0.3  0.0 - 1.2 mg/dL Final  . Alkaline Phosphatase 08/11/2013 64  39 - 117 IU/L Final  . AST 08/11/2013 25  0 - 40 IU/L Final  . ALT 08/11/2013 19  0 - 44 IU/L Final  . TSH 08/11/2013 0.186* 0.450 - 4.500 uIU/mL Final  . Creatinine, Ur 08/11/2013 166.8  22.0 - 328.0 mg/dL Final  . Microalbum.,U,Random 08/11/2013 5.9  0.0  - 17.0 ug/mL Final   **Verified by repeat analysis**  . MICROALB/CREAT RATIO 08/11/2013 3.5  0.0 - 30.0 mg/g creat Final     Assessment/Plan  1. Malignant neoplasm of thyroid gland Continue current dose of levothyroxine  2. Type II or unspecified type diabetes  mellitus without mention of complication, uncontrolled Continue exercises and dietary management  3. Obesity, unspecified 20 minute discussion regarding diet. Information material splintered off and given to patient for a 1200-calorie diet. Multiple other suggestions were given in regards to food items for snacks.  4. Essential hypertension Controlled  5. Edema Stable to improved.

## 2013-10-15 LAB — BASIC METABOLIC PANEL
BUN/Creatinine Ratio: 12 (ref 10–22)
BUN: 17 mg/dL (ref 8–27)
CALCIUM: 9.8 mg/dL (ref 8.6–10.2)
CHLORIDE: 97 mmol/L (ref 97–108)
CO2: 26 mmol/L (ref 18–29)
CREATININE: 1.38 mg/dL — AB (ref 0.76–1.27)
GFR calc Af Amer: 63 mL/min/{1.73_m2} (ref >59)
GFR calc non Af Amer: 55 mL/min/{1.73_m2} — ABNORMAL LOW (ref >59)
Glucose: 106 mg/dL — ABNORMAL HIGH (ref 65–99)
Potassium: 4.1 mmol/L (ref 3.5–5.2)
Sodium: 139 mmol/L (ref 134–144)

## 2013-10-15 LAB — LIPID PANEL
CHOL/HDL RATIO: 2.6 ratio (ref 0.0–5.0)
Cholesterol, Total: 154 mg/dL (ref 100–199)
HDL: 59 mg/dL (ref >39)
LDL Calculated: 79 mg/dL (ref 0–99)
Triglycerides: 80 mg/dL (ref 0–149)
VLDL Cholesterol Cal: 16 mg/dL (ref 5–40)

## 2013-10-15 LAB — HEMOGLOBIN A1C
ESTIMATED AVERAGE GLUCOSE: 157 mg/dL
HEMOGLOBIN A1C: 7.1 % — AB (ref 4.8–5.6)

## 2013-10-15 LAB — TSH: TSH: 0.154 u[IU]/mL — ABNORMAL LOW (ref 0.450–4.500)

## 2013-11-15 ENCOUNTER — Other Ambulatory Visit: Payer: Self-pay | Admitting: Internal Medicine

## 2013-11-17 ENCOUNTER — Other Ambulatory Visit: Payer: Self-pay | Admitting: *Deleted

## 2013-11-17 ENCOUNTER — Other Ambulatory Visit: Payer: BC Managed Care – PPO

## 2013-11-17 MED ORDER — METFORMIN HCL 1000 MG PO TABS: 1000.0000 mg | ORAL_TABLET | Freq: Every day | ORAL | Status: DC

## 2013-11-17 NOTE — Telephone Encounter (Signed)
[employer] [facility] 

## 2013-11-19 ENCOUNTER — Ambulatory Visit: Payer: BC Managed Care – PPO | Admitting: Internal Medicine

## 2013-11-27 ENCOUNTER — Other Ambulatory Visit: Payer: Self-pay | Admitting: Internal Medicine

## 2014-01-02 ENCOUNTER — Other Ambulatory Visit: Payer: BC Managed Care – PPO

## 2014-01-02 DIAGNOSIS — IMO0002 Reserved for concepts with insufficient information to code with codable children: Secondary | ICD-10-CM

## 2014-01-02 DIAGNOSIS — C73 Malignant neoplasm of thyroid gland: Secondary | ICD-10-CM

## 2014-01-02 DIAGNOSIS — E1165 Type 2 diabetes mellitus with hyperglycemia: Secondary | ICD-10-CM

## 2014-01-02 DIAGNOSIS — I10 Essential (primary) hypertension: Secondary | ICD-10-CM

## 2014-01-03 LAB — COMPREHENSIVE METABOLIC PANEL
ALBUMIN: 4.2 g/dL (ref 3.6–4.8)
ALK PHOS: 75 IU/L (ref 39–117)
ALT: 23 IU/L (ref 0–44)
AST: 23 IU/L (ref 0–40)
Albumin/Globulin Ratio: 1.5 (ref 1.1–2.5)
BUN / CREAT RATIO: 14 (ref 10–22)
BUN: 18 mg/dL (ref 8–27)
CHLORIDE: 101 mmol/L (ref 97–108)
CO2: 26 mmol/L (ref 18–29)
CREATININE: 1.3 mg/dL — AB (ref 0.76–1.27)
Calcium: 9.7 mg/dL (ref 8.6–10.2)
GFR calc Af Amer: 68 mL/min/{1.73_m2} (ref >59)
GFR calc non Af Amer: 59 mL/min/{1.73_m2} — ABNORMAL LOW (ref >59)
GLOBULIN, TOTAL: 2.8 g/dL (ref 1.5–4.5)
GLUCOSE: 102 mg/dL — AB (ref 65–99)
Potassium: 4.2 mmol/L (ref 3.5–5.2)
Sodium: 142 mmol/L (ref 134–144)
TOTAL PROTEIN: 7 g/dL (ref 6.0–8.5)
Total Bilirubin: <0.2 mg/dL (ref 0.0–1.2)

## 2014-01-03 LAB — HEMOGLOBIN A1C
ESTIMATED AVERAGE GLUCOSE: 154 mg/dL
HEMOGLOBIN A1C: 7 % — AB (ref 4.8–5.6)

## 2014-01-03 LAB — TSH: TSH: 0.225 u[IU]/mL — ABNORMAL LOW (ref 0.450–4.500)

## 2014-01-06 ENCOUNTER — Encounter: Payer: Self-pay | Admitting: Internal Medicine

## 2014-01-06 ENCOUNTER — Ambulatory Visit (INDEPENDENT_AMBULATORY_CARE_PROVIDER_SITE_OTHER): Payer: BLUE CROSS/BLUE SHIELD | Admitting: Internal Medicine

## 2014-01-06 VITALS — BP 108/64 | HR 72 | Temp 98.8°F | Wt 238.0 lb

## 2014-01-06 DIAGNOSIS — E669 Obesity, unspecified: Secondary | ICD-10-CM

## 2014-01-06 DIAGNOSIS — C73 Malignant neoplasm of thyroid gland: Secondary | ICD-10-CM

## 2014-01-06 DIAGNOSIS — E785 Hyperlipidemia, unspecified: Secondary | ICD-10-CM

## 2014-01-06 DIAGNOSIS — B353 Tinea pedis: Secondary | ICD-10-CM

## 2014-01-06 DIAGNOSIS — E119 Type 2 diabetes mellitus without complications: Secondary | ICD-10-CM

## 2014-01-06 DIAGNOSIS — I10 Essential (primary) hypertension: Secondary | ICD-10-CM

## 2014-01-18 ENCOUNTER — Other Ambulatory Visit: Payer: Self-pay | Admitting: Internal Medicine

## 2014-01-27 ENCOUNTER — Telehealth: Payer: Self-pay

## 2014-01-27 NOTE — Telephone Encounter (Signed)
Left message on voicemail for patient to return call when available , reason for call: I need reason as to why patient is using compound cream to provide diagnosis on form.

## 2014-01-28 NOTE — Telephone Encounter (Signed)
Patient returned call and indicated he is no longer interested in compound cream

## 2014-02-09 DIAGNOSIS — B353 Tinea pedis: Secondary | ICD-10-CM | POA: Insufficient documentation

## 2014-02-09 MED ORDER — TERBINAFINE HCL 1 % EX CREA: TOPICAL_CREAM | CUTANEOUS | Status: DC

## 2014-02-09 NOTE — Progress Notes (Signed)
Patient ID: Mitchell Gibbs, male   DOB: 05/23/1950, 64 y.o.   MRN: [id number]    Facility  [facility]    Place of Service:   OFFICE   No Known Allergies  Chief Complaint  Patient presents with  . Medical Management of Chronic Issues    blood pressure, blood sugar, cholestol   . left foot    noticed Sunday evening when he took shoe off sock was wet, area between big and second toe, can't see any sore.     HPI:  Tinea pedis of left foot: open lesions related to tinea pedis. Weeping.  Essential hypertension - controlled  Type 2 diabetes mellitus without complication - control is better  Hyperlipidemia - controlled  Obesity; exercising more. Weight is down 5#.  Malignant neoplasm of thyroid gland - stable    Medications: Patient's Medications  New Prescriptions   No medications on file  Previous Medications   ALBUTEROL (PROVENTIL HFA;VENTOLIN HFA) 108 (90 BASE) MCG/ACT INHALER    Inhale 2 puffs into the lungs every 12 (twelve) hours. For 5 days to help with your breathing   B-D ULTRAFINE III SHORT PEN 31G X 8 MM MISC    USE WITH INSULIN PEN   FUROSEMIDE (LASIX) 20 MG TABLET    TAKE 1 TABLET BY MOUTH EVERY DAY AS NEEDED TO CONTROL EDEMA   GLUCOSE BLOOD (ONE TOUCH TEST STRIPS) TEST STRIP    Use to check blood sugar twice a day for diabetes Dx. 250.00   GUAIFENESIN (ROBITUSSIN) 100 MG/5ML LIQUID    Take 200 mg by mouth 3 (three) times daily as needed for cough.   INSULIN GLARGINE (LANTUS) 100 UNIT/ML SOPN    Inject 20 units once a day for diabetes 250.00   LEVOTHYROXINE (SYNTHROID, LEVOTHROID) 150 MCG TABLET    Take 1 tablet (150 mcg total) by mouth daily.   LINAGLIPTIN (TRADJENTA) 5 MG TABS TABLET    Take One tablet once daily to control diabetes   LOVASTATIN (MEVACOR) 40 MG TABLET    TAKE 1 TABLET DAILY TO CONTROL CHOLESTEROL   METFORMIN (GLUCOPHAGE) 1000 MG TABLET    TAKE 1 TABLET BY MOUTH BEFORE BREAKFAST AND ONE BEFORE SUPPER TO CONTROL BLOOD SUGAR   TADALAFIL (CIALIS) 20 MG  TABLET    Take 1/2 to 1 tablet prior to intercourse to help erectile dysfunction  Modified Medications   Modified Medication Previous Medication   LOSARTAN-HYDROCHLOROTHIAZIDE (HYZAAR) 100-25 MG PER TABLET losartan-hydrochlorothiazide (HYZAAR) 100-25 MG per tablet      TAKE 1 TABLET EVERY DAY TO CONTROL BLOOD PRESSURE    TAKE 1 TABLET EVERY DAY TO CONTROL BLOOD PRESSURE  Discontinued Medications   METFORMIN (GLUCOPHAGE) 1000 MG TABLET    Take 1 tablet (1,000 mg total) by mouth daily with breakfast.   ZOSTER VACCINE LIVE, PF, (ZOSTAVAX) 31540 UNT/0.65ML INJECTION    Inject 19,400 Units into the skin once.     Review of Systems  Constitutional: Negative.   HENT: Negative.   Eyes: Negative.   Respiratory: Negative.   Cardiovascular: Positive for leg swelling (mainly the left leg). Negative for chest pain and palpitations.  Gastrointestinal: Negative for abdominal pain, abdominal distention and anal bleeding.  Endocrine: Negative for cold intolerance, heat intolerance, polydipsia, polyphagia and polyuria.       Hx thyroid malignancy and of DM.  Genitourinary: Negative.   Musculoskeletal:       Swollen left leg  Skin: Positive for rash.  Allergic/Immunologic: Negative.   Neurological: Negative.  Hematological: Negative.   Psychiatric/Behavioral: Negative.     Filed Vitals:   01/06/14 1741  BP: 108/64  Pulse: 72  Temp: 98.8 F (37.1 C)  TempSrc: Oral  Weight: 238 lb (107.956 kg)   Body mass index is 30.54 kg/(m^2).  Physical Exam  Constitutional: He is oriented to person, place, and time. He appears well-developed. No distress.  overweight  HENT:  Right Ear: External ear normal.  Left Ear: External ear normal.  Nose: Nose normal.  Eyes: Conjunctivae and EOM are normal. Pupils are equal, round, and reactive to light.  Neck: Normal range of motion. Neck supple. No JVD present. No tracheal deviation present. No thyromegaly present.  Cardiovascular: Normal rate, regular  rhythm, normal heart sounds and intact distal pulses.  Exam reveals no gallop and no friction rub.   No murmur heard. Pulmonary/Chest: No respiratory distress. He has no wheezes. He has no rales. He exhibits no tenderness.  Abdominal: He exhibits no distension and no mass. There is no tenderness.  Musculoskeletal: He exhibits edema (1-2 + left leg).  Left leg trace edema.  Lymphadenopathy:    He has no cervical adenopathy.  Neurological: He is alert and oriented to person, place, and time. He has normal reflexes. No cranial nerve deficit.  Skin: Rash (weeping tinea pedis of the left foot) noted. No erythema. No pallor.  Psychiatric: He has a normal mood and affect. His behavior is normal. Judgment and thought content normal.     Labs reviewed: Appointment on 01/02/2014  Component Date Value Ref Range Status  . TSH 01/02/2014 0.225* 0.450 - 4.500 uIU/mL Final  . Glucose 01/02/2014 102* 65 - 99 mg/dL Final  . BUN 01/02/2014 18  8 - 27 mg/dL Final  . Creatinine, Ser 01/02/2014 1.30* 0.76 - 1.27 mg/dL Final  . GFR calc non Af Amer 01/02/2014 59* >59 mL/min/1.73 Final  . GFR calc Af Amer 01/02/2014 68  >59 mL/min/1.73 Final  . BUN/Creatinine Ratio 01/02/2014 14  10 - 22 Final  . Sodium 01/02/2014 142  134 - 144 mmol/L Final  . Potassium 01/02/2014 4.2  3.5 - 5.2 mmol/L Final  . Chloride 01/02/2014 101  97 - 108 mmol/L Final  . CO2 01/02/2014 26  18 - 29 mmol/L Final  . Calcium 01/02/2014 9.7  8.6 - 10.2 mg/dL Final  . Total Protein 01/02/2014 7.0  6.0 - 8.5 g/dL Final  . Albumin 01/02/2014 4.2  3.6 - 4.8 g/dL Final  . Globulin, Total 01/02/2014 2.8  1.5 - 4.5 g/dL Final  . Albumin/Globulin Ratio 01/02/2014 1.5  1.1 - 2.5 Final  . Total Bilirubin 01/02/2014 <0.2  0.0 - 1.2 mg/dL Final  . Alkaline Phosphatase 01/02/2014 75  39 - 117 IU/L Final  . AST 01/02/2014 23  0 - 40 IU/L Final  . ALT 01/02/2014 23  0 - 44 IU/L Final  . Hgb A1c MFr Bld 01/02/2014 7.0* 4.8 - 5.6 % Final   Comment:           Pre-diabetes: 5.7 - 6.4          Diabetes: >6.4          Glycemic control for adults with diabetes: <7.0   . Est. average glucose Bld gHb Est-m* 01/02/2014 154   Final  Appointment on 10/14/2013  Component Date Value Ref Range Status  . Hgb A1c MFr Bld 10/14/2013 7.1* 4.8 - 5.6 % Final   Comment:          Increased risk for  diabetes: 5.7 - 6.4                                   Diabetes: >6.4                                   Glycemic control for adults with diabetes: <7.0  . Est. average glucose Bld gHb Est-m* 10/14/2013 157   Final  . Glucose 10/14/2013 106* 65 - 99 mg/dL Final  . BUN 10/14/2013 17  8 - 27 mg/dL Final  . Creatinine, Ser 10/14/2013 1.38* 0.76 - 1.27 mg/dL Final  . GFR calc non Af Amer 10/14/2013 55* >59 mL/min/1.73 Final  . GFR calc Af Amer 10/14/2013 63  >59 mL/min/1.73 Final  . BUN/Creatinine Ratio 10/14/2013 12  10 - 22 Final  . Sodium 10/14/2013 139  134 - 144 mmol/L Final  . Potassium 10/14/2013 4.1  3.5 - 5.2 mmol/L Final  . Chloride 10/14/2013 97  97 - 108 mmol/L Final  . CO2 10/14/2013 26  18 - 29 mmol/L Final  . Calcium 10/14/2013 9.8  8.6 - 10.2 mg/dL Final  . Cholesterol, Total 10/14/2013 154  100 - 199 mg/dL Final  . Triglycerides 10/14/2013 80  0 - 149 mg/dL Final  . HDL 10/14/2013 59  >39 mg/dL Final   Comment: According to ATP-III Guidelines, HDL-C >59 mg/dL is considered a                          negative risk factor for CHD.  Marland Kitchen VLDL Cholesterol Cal 10/14/2013 16  5 - 40 mg/dL Final  . LDL Calculated 10/14/2013 79  0 - 99 mg/dL Final  . Chol/HDL Ratio 10/14/2013 2.6  0.0 - 5.0 ratio units Final   Comment:                                   T. Chol/HDL Ratio                                                                      Men  Women                                                        1/2 Avg.Risk  3.4    3.3                                                            Avg.Risk  5.0    4.4  2X Avg.Risk  9.6    7.1                                                         3X Avg.Risk 23.4   11.0  . TSH 10/14/2013 0.154* 0.450 - 4.500 uIU/mL Final     Assessment/Plan  1. Tinea pedis of left foot - terbinafine (LAMISIL AT) 1 % cream; Apply daily to rash on foot  Dispense: 30 g; Refill: 0  2. Essential hypertension - Basic metabolic panel; Future  3. Type 2 diabetes mellitus without complication - Hemoglobin A1c; Future - Basic metabolic panel; Future - Microalbumin, urine; Future  4. Hyperlipidemia - Lipid panel; Future  5. Obesity -continue to exercise and watch diet  6. Malignant neoplasm of thyroid gland -prior surgical removal. No relapse. On suppression therapy. - TSH; Future

## 2014-02-27 LAB — HM DIABETES EYE EXAM

## 2014-03-03 ENCOUNTER — Encounter: Payer: Self-pay | Admitting: *Deleted

## 2014-03-31 ENCOUNTER — Other Ambulatory Visit: Payer: Self-pay | Admitting: Internal Medicine

## 2014-04-13 ENCOUNTER — Other Ambulatory Visit: Payer: Self-pay | Admitting: Internal Medicine

## 2014-04-19 IMAGING — CR DG CHEST 2V
2 series · 2 of 2 positions shown · non-contrast
Comparison: 01/18/2013

CLINICAL DATA: Followup pneumonia.

EXAM:
CHEST  2 VIEW

[w chest pa]
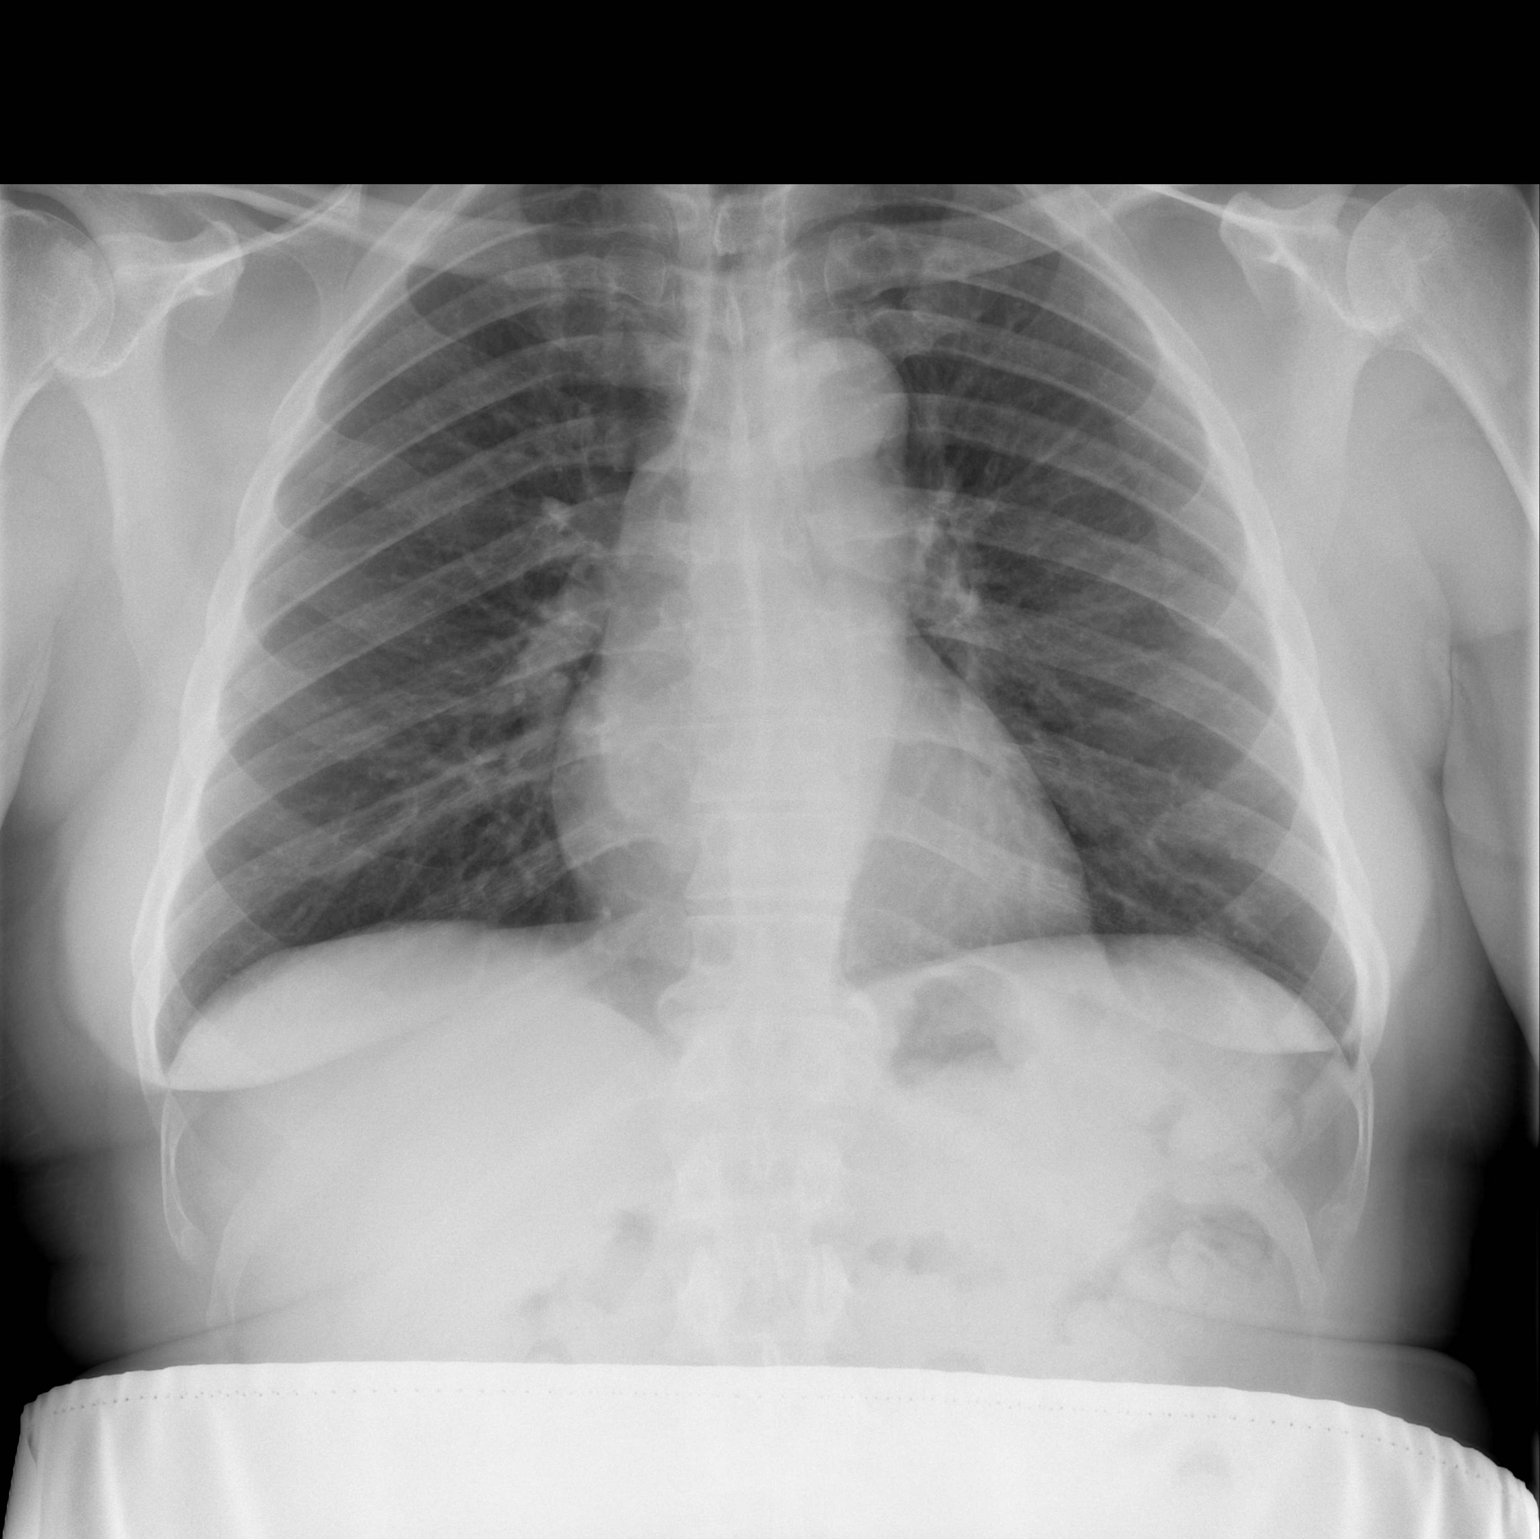

[w chest lat]
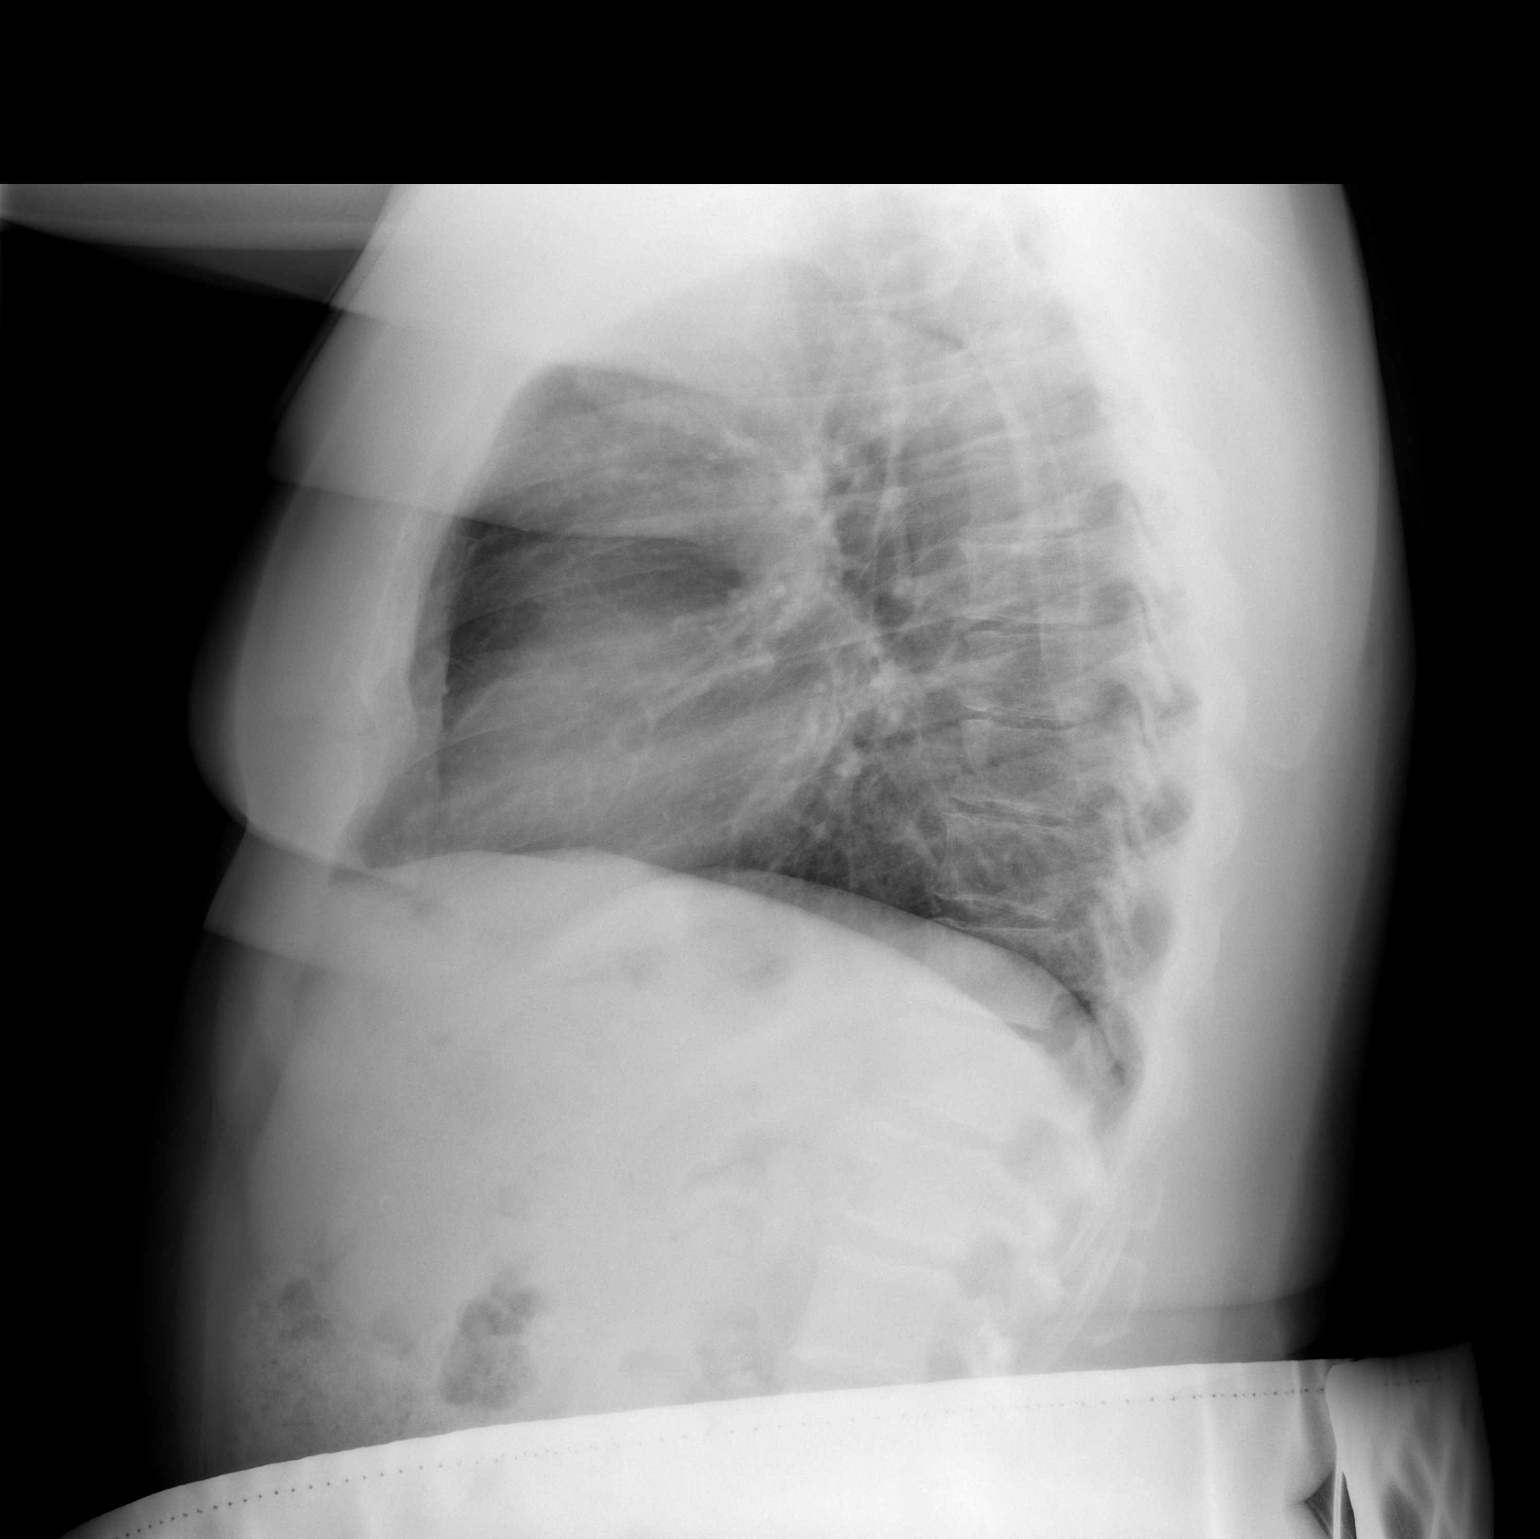

[2 of 2 positions shown; findings below may reference images not displayed]

FINDINGS: Interval complete clearing of left lower lobe infiltrate. Lungs are
now clear. No effusion.
IMPRESSION: No active cardiopulmonary disease.

## 2014-04-20 ENCOUNTER — Ambulatory Visit: Payer: BLUE CROSS/BLUE SHIELD | Admitting: Internal Medicine

## 2014-04-21 ENCOUNTER — Encounter: Payer: Self-pay | Admitting: Nurse Practitioner

## 2014-04-21 ENCOUNTER — Ambulatory Visit (INDEPENDENT_AMBULATORY_CARE_PROVIDER_SITE_OTHER): Payer: BLUE CROSS/BLUE SHIELD | Admitting: Nurse Practitioner

## 2014-04-21 VITALS — BP 120/84 | HR 66 | Temp 98.1°F | Resp 18 | Ht 75.0 in | Wt 244.4 lb

## 2014-04-21 DIAGNOSIS — S56911A Strain of unspecified muscles, fascia and tendons at forearm level, right arm, initial encounter: Secondary | ICD-10-CM

## 2014-04-21 DIAGNOSIS — S46911A Strain of unspecified muscle, fascia and tendon at shoulder and upper arm level, right arm, initial encounter: Secondary | ICD-10-CM

## 2014-04-21 NOTE — Patient Instructions (Addendum)
Will refer to ortho at this time No heavy lifting as this could cause more damage May use aleve 1 tablet twice daily

## 2014-04-21 NOTE — Progress Notes (Signed)
Patient ID: Mitchell Gibbs, male   DOB: [birth year], 64 y.o.   MRN: [id number]    PCP: Estill Dooms, MD  No Known Allergies  Chief Complaint  Patient presents with  . Acute Visit    Patient c/o has hurt right arm     HPI: Patient is a 64 y.o. male seen in the office today to follow up right arm pain. 2 weeks ago was picking up debris in his yard and picked up a log that was a larger than the rest. When he picked it up he heard a popping sound. Does not feel like anything is broken but his company nurse told him to have it checked out. Pain/discomfort around elbow and shoulder. Can move joints without pain or decrease ROM most of the time. some movements bring on pain though, when he moves his wrist he feels it closer to the elbow. Stiff in the morning, has to use left arm to do passive ROM then it is fine. Decreased strength to right arm. No swelling or bruising to arm  1 day took Aleve 1 time but that was it.  Review of Systems:  Review of Systems  Constitutional: Negative for chills, activity change, appetite change and fatigue.  Cardiovascular: Negative for chest pain.  Musculoskeletal: Positive for myalgias and arthralgias. Negative for back pain, joint swelling and neck pain.  Skin: Negative for color change and wound.    Past Medical History  Diagnosis Date  . Diabetes mellitus without complication   . Hypertension   . 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 Edema   . Trigger finger (acquired)   . Thyroid disease   . Malignant neoplasm of thyroid gland   . Supraventricular premature beats   . Carpal tunnel syndrome   . Memory loss   . Bell's palsy   . Internal hemorrhoids without mention of complication   . Gout, unspecified   . Obesity, unspecified   . Other psoriasis   . Unspecified sleep apnea   . Other malaise and fatigue   . Hyperlipidemia    Past Surgical History  Procedure Laterality Date  . Total thyroidectomy  2007    Dr. Georgina Quint   Social  History:   reports that he has never smoked. He has never used smokeless tobacco. He reports that he does not drink alcohol or use illicit drugs.  Family History  Problem Relation Age of Onset  . Hypertension Other   . Cancer - Prostate Father     Medications: Patient's Medications  New Prescriptions   No medications on file  Previous Medications   ALBUTEROL (PROVENTIL HFA;VENTOLIN HFA) 108 (90 BASE) MCG/ACT INHALER    Inhale 2 puffs into the lungs every 12 (twelve) hours. For 5 days to help with your breathing   B-D ULTRAFINE III SHORT PEN 31G X 8 MM MISC    USE WITH INSULIN PEN   CIALIS 20 MG TABLET    TAKE 1/2 TO 1 TABLET BY MOUTH PRIOR TO INTERCOURSE TO HELP WITH ERECTILE DYSFUNCTION   FUROSEMIDE (LASIX) 20 MG TABLET    TAKE 1 TABLET BY MOUTH EVERY DAY AS NEEDED TO CONTROL EDEMA   GUAIFENESIN (ROBITUSSIN) 100 MG/5ML LIQUID    Take 200 mg by mouth 3 (three) times daily as needed for cough.   INSULIN GLARGINE (LANTUS) 100 UNIT/ML SOPN    Inject 20 units once a day for diabetes 250.00   LEVOTHYROXINE (SYNTHROID, LEVOTHROID) 137 MCG TABLET  LINAGLIPTIN (TRADJENTA) 5 MG TABS TABLET    Take One tablet once daily to control diabetes   LOSARTAN-HYDROCHLOROTHIAZIDE (HYZAAR) 100-25 MG PER TABLET    TAKE 1 TABLET EVERY DAY TO CONTROL BLOOD PRESSURE   LOVASTATIN (MEVACOR) 40 MG TABLET    TAKE 1 TABLET DAILY TO CONTROL CHOLESTEROL   METFORMIN (GLUCOPHAGE) 1000 MG TABLET    TAKE 1 TABLET BY MOUTH BEFORE BREAKFAST AND ONE BEFORE SUPPER TO CONTROL BLOOD SUGAR   ONE TOUCH ULTRA TEST TEST STRIP    TEST 2 TIMES DAILY   TERBINAFINE (LAMISIL AT) 1 % CREAM    Apply daily to rash on foot  Modified Medications   No medications on file  Discontinued Medications   LEVOTHYROXINE (SYNTHROID, LEVOTHROID) 150 MCG TABLET    Take 1 tablet (150 mcg total) by mouth daily.     Physical Exam:  Filed Vitals:   04/21/14 1144  BP: 120/84  Pulse: 66  Temp: 98.1 F (36.7 C)  TempSrc: Oral  Resp: 18    Height: 6\' 3"  (1.905 m)  Weight: 244 lb 6.4 oz (110.859 kg)  SpO2: 97%    Physical Exam  Constitutional: He appears well-developed and well-nourished.  Cardiovascular: Normal rate, regular rhythm and normal heart sounds.   Pulmonary/Chest: Effort normal and breath sounds normal.  Musculoskeletal:       Right elbow: He exhibits normal range of motion, no swelling, no effusion, no deformity and no laceration. Tenderness found.  Point tenderness to Radial aspect of the distal antecubital area, also tender with movement of wrist;  decreased strength when pulling up.  Skin: Skin is warm and dry.    Labs reviewed: Basic Metabolic Panel:  Recent Labs  08/11/13 0840 10/14/13 0851 01/02/14 0907  NA 139 139 142  K 3.8 4.1 4.2  CL 99 97 101  CO2 23 26 26   GLUCOSE 106* 106* 102*  BUN 15 17 18   CREATININE 1.27 1.38* 1.30*  CALCIUM 9.3 9.8 9.7  TSH 0.186* 0.154* 0.225*   Liver Function Tests:  Recent Labs  06/06/13 1123 08/11/13 0840 01/02/14 0907  AST 21 25 23   ALT 14 19 23   ALKPHOS 72 64 75  BILITOT 0.3 0.3 <0.2  PROT 6.8 6.5 7.0   No results for input(s): LIPASE, AMYLASE in the last 8760 hours. No results for input(s): AMMONIA in the last 8760 hours. CBC: No results for input(s): WBC, NEUTROABS, HGB, HCT, MCV, PLT in the last 8760 hours. Lipid Panel:  Recent Labs  06/06/13 1123 10/14/13 0851  CHOL 138 154  HDL 57 59  LDLCALC 72 79  TRIG 43 80  CHOLHDL 2.4 2.6   TSH:  Recent Labs  08/11/13 0840 10/14/13 0851 01/02/14 0907  TSH 0.186* 0.154* 0.225*   A1C: Lab Results  Component Value Date   HGBA1C 7.0* 01/02/2014     Assessment/Plan  1. Elbow strain, right, initial encounter - will get Ambulatory referral to Orthopedics for further evaluation and treatment; possible ligament vs tendon injury -not to lift anything over 5 lb -may use aleve twice daily for 1 week to help with pain and inflammation.

## 2014-05-01 ENCOUNTER — Other Ambulatory Visit: Payer: BLUE CROSS/BLUE SHIELD

## 2014-05-01 DIAGNOSIS — E785 Hyperlipidemia, unspecified: Secondary | ICD-10-CM

## 2014-05-01 DIAGNOSIS — I10 Essential (primary) hypertension: Secondary | ICD-10-CM

## 2014-05-01 DIAGNOSIS — C73 Malignant neoplasm of thyroid gland: Secondary | ICD-10-CM

## 2014-05-01 DIAGNOSIS — E119 Type 2 diabetes mellitus without complications: Secondary | ICD-10-CM

## 2014-05-02 LAB — LIPID PANEL
CHOL/HDL RATIO: 2.7 ratio (ref 0.0–5.0)
CHOLESTEROL TOTAL: 171 mg/dL (ref 100–199)
HDL: 63 mg/dL (ref >39)
LDL Calculated: 96 mg/dL (ref 0–99)
TRIGLYCERIDES: 62 mg/dL (ref 0–149)
VLDL Cholesterol Cal: 12 mg/dL (ref 5–40)

## 2014-05-02 LAB — BASIC METABOLIC PANEL
BUN / CREAT RATIO: 15 (ref 10–22)
BUN: 21 mg/dL (ref 8–27)
CO2: 26 mmol/L (ref 18–29)
Calcium: 10 mg/dL (ref 8.6–10.2)
Chloride: 98 mmol/L (ref 97–108)
Creatinine, Ser: 1.38 mg/dL — ABNORMAL HIGH (ref 0.76–1.27)
GFR calc non Af Amer: 55 mL/min/{1.73_m2} — ABNORMAL LOW (ref >59)
GFR, EST AFRICAN AMERICAN: 63 mL/min/{1.73_m2} (ref >59)
Glucose: 98 mg/dL (ref 65–99)
Potassium: 4.1 mmol/L (ref 3.5–5.2)
Sodium: 140 mmol/L (ref 134–144)

## 2014-05-02 LAB — MICROALBUMIN, URINE: MICROALBUM., U, RANDOM: 5.2 ug/mL (ref 0.0–17.0)

## 2014-05-02 LAB — TSH: TSH: 0.521 u[IU]/mL (ref 0.450–4.500)

## 2014-05-02 LAB — HEMOGLOBIN A1C
Est. average glucose Bld gHb Est-mCnc: 160 mg/dL
Hgb A1c MFr Bld: 7.2 % — ABNORMAL HIGH (ref 4.8–5.6)

## 2014-05-05 ENCOUNTER — Ambulatory Visit: Payer: Self-pay | Admitting: Internal Medicine

## 2014-05-12 ENCOUNTER — Ambulatory Visit (INDEPENDENT_AMBULATORY_CARE_PROVIDER_SITE_OTHER): Payer: BLUE CROSS/BLUE SHIELD | Admitting: Internal Medicine

## 2014-05-12 ENCOUNTER — Encounter: Payer: Self-pay | Admitting: Internal Medicine

## 2014-05-12 VITALS — BP 138/60 | HR 67 | Temp 98.2°F | Resp 20 | Ht 75.0 in | Wt 240.4 lb

## 2014-05-12 DIAGNOSIS — E669 Obesity, unspecified: Secondary | ICD-10-CM | POA: Diagnosis not present

## 2014-05-12 DIAGNOSIS — E1122 Type 2 diabetes mellitus with diabetic chronic kidney disease: Secondary | ICD-10-CM

## 2014-05-12 DIAGNOSIS — R109 Unspecified abdominal pain: Secondary | ICD-10-CM | POA: Diagnosis not present

## 2014-05-12 DIAGNOSIS — C73 Malignant neoplasm of thyroid gland: Secondary | ICD-10-CM

## 2014-05-12 DIAGNOSIS — I10 Essential (primary) hypertension: Secondary | ICD-10-CM

## 2014-05-12 DIAGNOSIS — E785 Hyperlipidemia, unspecified: Secondary | ICD-10-CM | POA: Diagnosis not present

## 2014-05-12 DIAGNOSIS — N189 Chronic kidney disease, unspecified: Secondary | ICD-10-CM

## 2014-05-12 DIAGNOSIS — Z23 Encounter for immunization: Secondary | ICD-10-CM | POA: Diagnosis not present

## 2014-05-12 DIAGNOSIS — R609 Edema, unspecified: Secondary | ICD-10-CM | POA: Diagnosis not present

## 2014-05-12 NOTE — Progress Notes (Signed)
Patient ID: Mitchell Gibbs, male   DOB: 02-04-1950, 64 y.o.   MRN: [id number]    Facility  [facility]    Place of Service:   OFFICE    No Known Allergies  Chief Complaint  Patient presents with  . Medical Management of Chronic Issues    4 month follow-up, discuss labs ( copy printed )    HPI:  Type 2 diabetes mellitus with diabetic chronic kidney disease -controlled   Essential hypertension - controlled  Edema: unchanged in the left leg  Malignant neoplasm of thyroid gland - patient saw Dr. Chalmers Cater recently. She adjusted his levothyroxine down to 125 g from 137 g daily.  Hyperlipidemia -controlled  Obesity-lost 4 pounds since his last visit. Following diet closer. Exercising.  Flank pain- right flank area and sacroiliac area. No known trauma. No falls. Denies any problems with dysuria or increased frequency. Hurts to twist and move the right side. Worse when he stands. Pain has improved over the last several days. Seemed to start about for 5 days ago. He has used ibuprofen and it does seem to help.   Medications: Patient's Medications  New Prescriptions   No medications on file  Previous Medications   ALBUTEROL (PROVENTIL HFA;VENTOLIN HFA) 108 (90 BASE) MCG/ACT INHALER    Inhale 2 puffs into the lungs every 12 (twelve) hours. For 5 days to help with your breathing   B-D ULTRAFINE III SHORT PEN 31G X 8 MM MISC    USE WITH INSULIN PEN   CIALIS 20 MG TABLET    TAKE 1/2 TO 1 TABLET BY MOUTH PRIOR TO INTERCOURSE TO HELP WITH ERECTILE DYSFUNCTION   FUROSEMIDE (LASIX) 20 MG TABLET    TAKE 1 TABLET BY MOUTH EVERY DAY AS NEEDED TO CONTROL EDEMA   GUAIFENESIN (ROBITUSSIN) 100 MG/5ML LIQUID    Take 200 mg by mouth 3 (three) times daily as needed for cough.   INSULIN GLARGINE (LANTUS) 100 UNIT/ML SOPN    Inject 20 units once a day for diabetes 250.00   LEVOTHYROXINE (SYNTHROID, LEVOTHROID) 137 MCG TABLET       LINAGLIPTIN (TRADJENTA) 5 MG TABS TABLET    Take One tablet once daily to  control diabetes   LOSARTAN-HYDROCHLOROTHIAZIDE (HYZAAR) 100-25 MG PER TABLET    TAKE 1 TABLET EVERY DAY TO CONTROL BLOOD PRESSURE   LOVASTATIN (MEVACOR) 40 MG TABLET    TAKE 1 TABLET DAILY TO CONTROL CHOLESTEROL   METFORMIN (GLUCOPHAGE) 1000 MG TABLET    TAKE 1 TABLET BY MOUTH BEFORE BREAKFAST AND ONE BEFORE SUPPER TO CONTROL BLOOD SUGAR   ONE TOUCH ULTRA TEST TEST STRIP    TEST 2 TIMES DAILY   TERBINAFINE (LAMISIL AT) 1 % CREAM    Apply daily to rash on foot  Modified Medications   No medications on file  Discontinued Medications   No medications on file     Review of Systems  Constitutional: Negative.   HENT: Negative.   Eyes: Negative.   Respiratory: Negative.   Cardiovascular: Positive for leg swelling (mainly the left leg). Negative for chest pain and palpitations.  Gastrointestinal: Negative for abdominal pain, abdominal distention and anal bleeding.  Endocrine: Negative for cold intolerance, heat intolerance, polydipsia, polyphagia and polyuria.       Hx thyroid malignancy and of DM.  Genitourinary: Negative.   Musculoskeletal:       Swollen left leg  Skin: Positive for rash.  Allergic/Immunologic: Negative.   Neurological: Negative.   Hematological: Negative.   Psychiatric/Behavioral: Negative.  Filed Vitals:   05/12/14 1639  BP: 138/60  Pulse: 67  Temp: 98.2 F (36.8 C)  TempSrc: Oral  Resp: 20  Height: 6\' 3"  (1.905 m)  Weight: 240 lb 6.4 oz (109.045 kg)  SpO2: 96%   Body mass index is 30.05 kg/(m^2).  Physical Exam  Constitutional: He is oriented to person, place, and time. He appears well-developed. No distress.  overweight  HENT:  Right Ear: External ear normal.  Left Ear: External ear normal.  Nose: Nose normal.  Eyes: Conjunctivae and EOM are normal. Pupils are equal, round, and reactive to light.  Neck: Normal range of motion. Neck supple. No JVD present. No tracheal deviation present. No thyromegaly present.  Cardiovascular: Normal rate,  regular rhythm, normal heart sounds and intact distal pulses.  Exam reveals no gallop and no friction rub.   No murmur heard. Pulmonary/Chest: No respiratory distress. He has no wheezes. He has no rales. He exhibits no tenderness.  Abdominal: He exhibits no distension and no mass. There is no tenderness.  Musculoskeletal: He exhibits edema (1-2 + left leg).  Left leg trace edema. Right flank discomfort since about 05/08/2014. No pain on percussion of flank.  Lymphadenopathy:    He has no cervical adenopathy.  Neurological: He is alert and oriented to person, place, and time. He has normal reflexes. No cranial nerve deficit.  Skin: Rash (weeping tinea pedis of the left foot) noted. No erythema. No pallor.  Psychiatric: He has a normal mood and affect. His behavior is normal. Judgment and thought content normal.     Labs reviewed: Appointment on 05/01/2014  Component Date Value Ref Range Status  . Hgb A1c MFr Bld 05/01/2014 7.2* 4.8 - 5.6 % Final   Comment:          Pre-diabetes: 5.7 - 6.4          Diabetes: >6.4          Glycemic control for adults with diabetes: <7.0   . Est. average glucose Bld gHb Est-m* 05/01/2014 160   Final  . Glucose 05/01/2014 98  65 - 99 mg/dL Final  . BUN 05/01/2014 21  8 - 27 mg/dL Final  . Creatinine, Ser 05/01/2014 1.38* 0.76 - 1.27 mg/dL Final  . GFR calc non Af Amer 05/01/2014 55* >59 mL/min/1.73 Final  . GFR calc Af Amer 05/01/2014 63  >59 mL/min/1.73 Final  . BUN/Creatinine Ratio 05/01/2014 15  10 - 22 Final  . Sodium 05/01/2014 140  134 - 144 mmol/L Final  . Potassium 05/01/2014 4.1  3.5 - 5.2 mmol/L Final  . Chloride 05/01/2014 98  97 - 108 mmol/L Final  . CO2 05/01/2014 26  18 - 29 mmol/L Final  . Calcium 05/01/2014 10.0  8.6 - 10.2 mg/dL Final  . TSH 05/01/2014 0.521  0.450 - 4.500 uIU/mL Final  . Microalbum.,U,Random 05/01/2014 5.2  0.0 - 17.0 ug/mL Final   Comment: **Effective May 25, 2014 the reference interval for**   Microalbumin, Urine  will be changing to:                                         Not Estab.   . Cholesterol, Total 05/01/2014 171  100 - 199 mg/dL Final  . Triglycerides 05/01/2014 62  0 - 149 mg/dL Final  . HDL 05/01/2014 63  >39 mg/dL Final   Comment: According to ATP-III Guidelines, HDL-C >  59 mg/dL is considered a negative risk factor for CHD.   Marland Kitchen VLDL Cholesterol Cal 05/01/2014 12  5 - 40 mg/dL Final  . LDL Calculated 05/01/2014 96  0 - 99 mg/dL Final  . Chol/HDL Ratio 05/01/2014 2.7  0.0 - 5.0 ratio units Final   Comment:                                   T. Chol/HDL Ratio                                             Men  Women                               1/2 Avg.Risk  3.4    3.3                                   Avg.Risk  5.0    4.4                                2X Avg.Risk  9.6    7.1                                3X Avg.Risk 23.4   11.0   Abstract on 03/03/2014  Component Date Value Ref Range Status  . HM Diabetic Eye Exam 02/27/2014 No Retinopathy  No Retinopathy Final   Dr. Elveria Rising Exam [phone]     Assessment/Plan  1. Type 2 diabetes mellitus with diabetic chronic kidney disease - Hemoglobin A1c; Future - Basic metabolic panel; Future  2. Essential hypertension - Basic metabolic panel; Future  3. Edema unchanged  4. Malignant neoplasm of thyroid gland - TSH; Future  5. Hyperlipidemia - Lipid panel; Future  6. Obesity Encourage weight loss  7. Flank pain Uncertain etiology. Suspect musculoskeletal. Advised to simply wait for a while and use ibuprofen. If still having pain in 2 weeks, call us.  8. Need for vaccination with 13-polyvalent pneumococcal conjugate vaccine - Pneumococcal conjugate vaccine 13-valent IM

## 2014-05-16 ENCOUNTER — Other Ambulatory Visit: Payer: Self-pay | Admitting: Internal Medicine

## 2014-05-26 ENCOUNTER — Ambulatory Visit (INDEPENDENT_AMBULATORY_CARE_PROVIDER_SITE_OTHER): Payer: BLUE CROSS/BLUE SHIELD | Admitting: Sports Medicine

## 2014-05-26 ENCOUNTER — Encounter: Payer: Self-pay | Admitting: Sports Medicine

## 2014-05-26 VITALS — BP 120/70 | HR 96 | Temp 98.7°F | Resp 18 | Ht 74.0 in | Wt 236.0 lb

## 2014-05-26 DIAGNOSIS — S61211A Laceration without foreign body of left index finger without damage to nail, initial encounter: Secondary | ICD-10-CM | POA: Diagnosis not present

## 2014-05-26 DIAGNOSIS — M79642 Pain in left hand: Secondary | ICD-10-CM | POA: Diagnosis not present

## 2014-05-26 MED ORDER — CEPHALEXIN 500 MG PO CAPS
500.0000 mg | ORAL_CAPSULE | Freq: Two times a day (BID) | ORAL | Status: DC
Start: 2014-05-26 — End: 2014-06-09

## 2014-05-26 NOTE — Progress Notes (Addendum)
   Subjective:    Patient ID: Mitchell Gibbs, male    DOB: 06/24/1950, 64 y.o.   MRN: [id number]  HPI Mitchell Gibbs is a 64 year old right-hand-dominant male who presents with an acute left index finger injury. He was at home, cutting hedges when the hedge trimmer slipped and lacerated his distal left index finger. The injury occurred around 6 PM this evening. He reports mild pain and persistent bleeding from the wound. He denies noticing if any foreign body penetrated the wound.  His last tetanus he says was within the past 5 years, as he states that he is a diabetic and makes sure to be up-to-date on his tetanus vaccine. His other past medical problems include hypertension and surgically induced hypothyroidism. He denies any associated numbness, tingling, loss of sensation, or weakness in the left index finger. Pain is controlled and he has not taken any medications.  No Known Allergies  Past medical history, social history, medications, and allergies were reviewed and are up to date in the chart.  Review of Systems 7 point review of systems was performed and was otherwise negative unless noted in the history of present illness.     Objective:   Physical Exam BP 120/70 mmHg  Pulse 96  Temp(Src) 98.7 F (37.1 C) (Oral)  Resp 18  Ht 6\' 2"  (1.88 m)  Wt 236 lb (107.049 kg)  BMI 30.29 kg/m2  SpO2 98% General appearance: alert, cooperative and appears stated age Extremities: full ROM and strength at left MCP, PIP, and DIP to flexion and extension Pulses: 2+ and symmetric Skin: approximately 1.5 cm hemi-circumferential linear laceration at the distal left phalanx, not involving the nail and not probing to bone  Neuro: Sensation intact throughout     Assessment & Plan:  1. Left 2nd DIP laceration, ligaments intact, not probing to bone  -Tetanus UTD -Wound cleansed, anesthetized, and sutured with assistance of Renae Fickle, PAC. -Wound cares discussed, dressing changes -Rx Keflex 500mg  bid  x 7 days -Plan for follow-up in 10-14 days for suture removal -Follow-up sooner if noticing and signs of infection, including persistent pain, bleeding, spreading rash, numbness, weakness, or for any other concerns. Patient verbalized understanding and agreement.  Dr. Linnell Fulling, DO Sports Medicine Fellow I have participated in the care of this patient with the Sports Fellow, and agree with Diagnosis and Plan as documented. Mitchell Gibbs, M.D.

## 2014-05-26 NOTE — Progress Notes (Signed)
Procedure: Risk and benefits discussed and verbal consent obtained. The patient was anesthetized using 4 cc of 2% lidocaine, Digital block. The wound was scrubbed with soap and water. Sterile prep and drape. The wound was closed with 4-0 Prolene.  The wound was then cleaned with water and a bandage was applied. Patient instructed to come back for suture removal in 10-14 days.  Philis Fendt, MS, PA-C   7:36 PM, 05/26/2014

## 2014-05-26 NOTE — Patient Instructions (Signed)
WOUND CARE Please return in 10-14 days to have your stitches/staples removed or sooner if you have concerns.  Keep area clean and dry for 24 hours. Do not remove bandage, if applied.  After 24 hours, remove bandage and wash wound gently with mild soap and warm water. Reapply a new bandage after cleaning wound, if directed.  Continue daily cleansing with soap and water until stitches/staples are removed.  Do not apply any ointments or creams to the wound while stitches/staples are in place, as this may cause delayed healing.  Notify the office if you experience any of the following signs of infection: Swelling, redness, pus drainage, streaking, fever >101.0 F  Notify the office if you experience excessive bleeding that does not stop after 15-20 minutes of constant, firm pressure.  

## 2014-06-09 ENCOUNTER — Ambulatory Visit (INDEPENDENT_AMBULATORY_CARE_PROVIDER_SITE_OTHER): Payer: BLUE CROSS/BLUE SHIELD | Admitting: Family Medicine

## 2014-06-09 VITALS — BP 110/70 | HR 82 | Temp 99.1°F | Ht 74.0 in | Wt 235.5 lb

## 2014-06-09 DIAGNOSIS — S61219D Laceration without foreign body of unspecified finger without damage to nail, subsequent encounter: Secondary | ICD-10-CM

## 2014-06-09 NOTE — Progress Notes (Signed)
Patient is here for follow-up laceration of his left index finger. He's had no problems and he now can move the finger fine.  Objective: Well-healed laceration left index finger, radial side of the tip.  Sutures removed without difficulty  Assessment: Healed laceration  This chart was scribed in my presence and reviewed by me personally.    ICD-9-CM ICD-10-CM   1. Laceration of finger, subsequent encounter V58.89 S61.219D    883.0       Signed, Robyn Haber, MD

## 2014-07-01 ENCOUNTER — Ambulatory Visit (INDEPENDENT_AMBULATORY_CARE_PROVIDER_SITE_OTHER): Payer: BLUE CROSS/BLUE SHIELD | Admitting: Family Medicine

## 2014-07-01 VITALS — BP 118/70 | HR 71 | Temp 99.1°F | Resp 17 | Ht 73.0 in | Wt 235.0 lb

## 2014-07-01 DIAGNOSIS — L03012 Cellulitis of left finger: Secondary | ICD-10-CM

## 2014-07-01 MED ORDER — AMOXICILLIN-POT CLAVULANATE 875-125 MG PO TABS
1.0000 | ORAL_TABLET | Freq: Two times a day (BID) | ORAL | Status: DC
Start: 2014-07-01 — End: 2014-07-13

## 2014-07-01 NOTE — Patient Instructions (Signed)

## 2014-07-01 NOTE — Progress Notes (Signed)
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 He lacerated his left index finger at the distal volar phalanx about one month ago. He was given Keflex and the wound seemed to heal well.  Starting 5 days ago he started developing more swelling, redness, and tenderness in the distal phalanx. He saw the company nurse and she referred him here.   objective: Alert, articulate and cooperative gentleman BP 118/70 mmHg  Pulse 71  Temp(Src) 99.1 F (37.3 C) (Oral)  Resp 17  Ht 6\' 1"  (1.854 m)  Wt 235 lb (106.595 kg)  BMI 31.01 kg/m2  SpO2 98% Examination of the left index finger reveals marked erythema and mild swelling and tenderness in the distal volar phalanx of the left index finger. He has complete range of motion  This chart was scribed in my presence and reviewed by me personally.    ICD-9-CM ICD-10-CM   1. Cellulitis of finger of left hand 681.00 L03.012 amoxicillin-clavulanate (AUGMENTIN) 875-125 MG per tablet   Patient told to come back in 2 days if there is not significant improvement. He agrees with the plan.  Signed, Robyn Haber, MD

## 2014-07-12 ENCOUNTER — Telehealth: Payer: Self-pay

## 2014-07-12 NOTE — Telephone Encounter (Signed)
Pt was seen on 6/15 by Dr. Joseph Art and was prescribed amoxicillin-clavulanate (AUGMENTIN) 875-125 MG per tablet [[id number]] for his infected finger. He says his finger is still swollen and would like to know what to do? Please advise at [phone]

## 2014-07-13 ENCOUNTER — Ambulatory Visit (INDEPENDENT_AMBULATORY_CARE_PROVIDER_SITE_OTHER): Payer: BLUE CROSS/BLUE SHIELD

## 2014-07-13 ENCOUNTER — Ambulatory Visit (INDEPENDENT_AMBULATORY_CARE_PROVIDER_SITE_OTHER): Payer: BLUE CROSS/BLUE SHIELD | Admitting: Family Medicine

## 2014-07-13 ENCOUNTER — Telehealth: Payer: Self-pay

## 2014-07-13 VITALS — BP 111/72 | HR 98 | Temp 98.0°F | Resp 16 | Ht 74.0 in | Wt 238.0 lb

## 2014-07-13 DIAGNOSIS — S61219D Laceration without foreign body of unspecified finger without damage to nail, subsequent encounter: Secondary | ICD-10-CM

## 2014-07-13 DIAGNOSIS — S61211A Laceration without foreign body of left index finger without damage to nail, initial encounter: Secondary | ICD-10-CM | POA: Diagnosis not present

## 2014-07-13 DIAGNOSIS — L03012 Cellulitis of left finger: Secondary | ICD-10-CM

## 2014-07-13 NOTE — Telephone Encounter (Signed)
Left message to advise pt to come in to be rechecked.

## 2014-07-13 NOTE — Progress Notes (Signed)
   Subjective:    Patient ID: Mitchell Gibbs, male    DOB: 03-Oct-1950, 64 y.o.   MRN: [id number]  HPI Patient presents today for follow up of his finger injury/laceration.  He had 5 sutures placed 05/26/14 to repair a laceration he sustained while using a hedge trimmer. He was given Keflex 500 mg po BIDx 7 days. He returned 5/24 for suture removal and the wound looked good at that time.  He was seen 6/10 with 5 days of swelling, redness and tenderness and was given augmentin BID x 10 days without resolution of symptoms. He continues to have swelling, warmth, redness. No pain. Has had some loss of sensation since the laceration occurred. No drainage. He is feeling some frustration about the ongoing nature of this problem.   He sees Dr. Elder Negus regularly for PCP. He has diabetes and reports blood sugars running in 120s.     Review of Systems As above in HPI    Objective:   Physical Exam  Constitutional: He is oriented to person, place, and time. He appears well-developed and well-nourished.  HENT:  Head: Normocephalic and atraumatic.  Eyes: Conjunctivae are normal.  Cardiovascular: Normal rate.   Pulmonary/Chest: Effort normal.  Musculoskeletal:       Hands: Neurological: He is alert and oriented to person, place, and time.  Skin: Skin is warm and dry.  Psychiatric: He has a normal mood and affect. His behavior is normal. Judgment and thought content normal.  Vitals reviewed.   BP 111/72 mmHg  Pulse 98  Temp(Src) 98 F (36.7 C)  Resp 16  Ht 6\' 2"  (1.88 m)  Wt 238 lb (107.956 kg)  BMI 30.54 kg/m2  SpO2 99%  [facility] reading (PRIMARY) by  Dr. Lorelei Pont- left index finger Xray: No occult fracture     Assessment & Plan:  Discussed with Dr. Lorelei Pont who examined patient.  1. Laceration of finger, subsequent encounter - DG Finger Index Left; Future - Reassured patient. Does not look infected. Encouraged him to work on ROM and continue to monitor.  - offered referral to hand  specialist- patient declined at this time - RTC if worsening symptoms, otherwise he can followup with Dr. Nyoka Cowden as scheduled next month.   Clarene Reamer, FNP-BC  [facility]  07/13/2014 12:08 PM

## 2014-07-14 MED ORDER — AMOXICILLIN-POT CLAVULANATE 875-125 MG PO TABS: 1.0000 | ORAL_TABLET | Freq: Two times a day (BID) | ORAL | Status: DC

## 2014-07-14 NOTE — Progress Notes (Signed)
addnd 6/28- reviewed his x-ray report, called pt but had to [place] 2+V  COMPARISON: None.  FINDINGS: The bones of the index finger are adequately mineralized. There is no lytic or blastic lesion. There is no acute fracture nor dislocation. The joint spaces are preserved. The soft tissues exhibit no foreign bodies. A small amount of soft tissue gas is suspected over the radial aspect of the middle phalanx.  IMPRESSION: There is no acute bony abnormality. Specifically there are no findings to suggest a fracture nor osteomyelitis. There is subtle lucency on one view only in the soft tissues of the radial aspect of the middle portion of the index finger which may reflect soft tissue gas and may reflect underlying cellulitis.  Will give him another 10 days of augmentin.    Called again 6/29 to go over this info- he will let me know if his sx do not resolve

## 2014-07-28 ENCOUNTER — Ambulatory Visit (INDEPENDENT_AMBULATORY_CARE_PROVIDER_SITE_OTHER): Payer: BLUE CROSS/BLUE SHIELD | Admitting: Internal Medicine

## 2014-07-28 ENCOUNTER — Encounter: Payer: Self-pay | Admitting: Internal Medicine

## 2014-07-28 VITALS — BP 128/80 | HR 69 | Temp 98.1°F | Ht 74.0 in | Wt 233.0 lb

## 2014-07-28 DIAGNOSIS — N189 Chronic kidney disease, unspecified: Secondary | ICD-10-CM

## 2014-07-28 DIAGNOSIS — I10 Essential (primary) hypertension: Secondary | ICD-10-CM

## 2014-07-28 DIAGNOSIS — E1122 Type 2 diabetes mellitus with diabetic chronic kidney disease: Secondary | ICD-10-CM

## 2014-07-28 DIAGNOSIS — M79645 Pain in left finger(s): Secondary | ICD-10-CM | POA: Diagnosis not present

## 2014-07-28 MED ORDER — INSULIN GLARGINE 100 UNITS/ML SOLOSTAR PEN: PEN_INJECTOR | SUBCUTANEOUS | Status: DC

## 2014-07-28 NOTE — Progress Notes (Signed)
Patient ID: Mitchell Gibbs, male   DOB: 1950/02/19, 64 y.o.   MRN: [id number]    Facility  [facility]    Place of Service:   Office    No Known Allergies  Chief Complaint  Patient presents with  . Acute Visit    Swelling in finger- ongoing concern (left . Patient took 3 rounds of ABX     HPI:  Pain in finger of left hand: Patient was seen 05/26/2014 and [facility] for a laceration of the left index finger sustained when the hedge trimmer slipped and bit into his finger. 5 sutures were placed and antibiotics were ordered. He returned again to urgent care on 06/09/2014, 07/01/2014, and 07/13/2014. Cellulitis of the area of the laceration was diagnosed and antibiotics were again prescribed. He has received 2 courses of Keflex and one of Augmentin. His finger remains tender. Although initially hot and swollen, it is no longer hot. Tenderness is present at the area of suturing. There are hard areas of the tissue where the sutures penetrated the skin.  Type 2 diabetes mellitus with diabetic chronic kidney disease - controlled   Essential hypertension - controlled    Medications: Patient's Medications  New Prescriptions   No medications on file  Previous Medications   ALBUTEROL (PROVENTIL HFA;VENTOLIN HFA) 108 (90 BASE) MCG/ACT INHALER    Inhale 2 puffs into the lungs every 12 (twelve) hours. For 5 days to help with your breathing   B-D ULTRAFINE III SHORT PEN 31G X 8 MM MISC    USE WITH INSULIN PEN   CIALIS 20 MG TABLET    TAKE 1/2 TO 1 TABLET BY MOUTH PRIOR TO INTERCOURSE TO HELP WITH ERECTILE DYSFUNCTION   FUROSEMIDE (LASIX) 20 MG TABLET    TAKE 1 TABLET BY MOUTH EVERY DAY AS NEEDED TO CONTROL EDEMA   GUAIFENESIN (ROBITUSSIN) 100 MG/5ML LIQUID    Take 200 mg by mouth 3 (three) times daily as needed for cough.   INSULIN GLARGINE (LANTUS) 100 UNIT/ML SOPN    Inject 20 units once a day for diabetes 250.00   LEVOTHYROXINE (SYNTHROID, LEVOTHROID) 125 MCG TABLET    Take 125 mcg by  mouth daily.   LOSARTAN-HYDROCHLOROTHIAZIDE (HYZAAR) 100-25 MG PER TABLET    TAKE 1 TABLET EVERY DAY TO CONTROL BLOOD PRESSURE   LOVASTATIN (MEVACOR) 40 MG TABLET    TAKE 1 TABLET DAILY TO CONTROL CHOLESTEROL   METFORMIN (GLUCOPHAGE) 1000 MG TABLET    TAKE 1 TABLET BY MOUTH BEFORE BREAKFAST AND ONE BEFORE SUPPER TO CONTROL BLOOD SUGAR   ONE TOUCH ULTRA TEST TEST STRIP    TEST 2 TIMES DAILY   TERBINAFINE (LAMISIL AT) 1 % CREAM    Apply daily to rash on foot   TRADJENTA 5 MG TABS TABLET    TAKE 1 TABLET BY MOUTH EVERY DAY TO CONTROL DIABETES  Modified Medications   No medications on file  Discontinued Medications   AMOXICILLIN-CLAVULANATE (AUGMENTIN) 875-125 MG PER TABLET    Take 1 tablet by mouth 2 (two) times daily.     Review of Systems  Constitutional: Negative.   HENT: Negative.   Eyes: Negative.   Respiratory: Negative.   Cardiovascular: Positive for leg swelling (mainly the left leg). Negative for chest pain and palpitations.  Gastrointestinal: Negative for abdominal pain, abdominal distention and anal bleeding.  Endocrine: Negative for cold intolerance, heat intolerance, polydipsia, polyphagia and polyuria.       Hx thyroid malignancy and of DM.  Genitourinary: Negative.  Musculoskeletal:       Swollen left leg  Skin: Positive for rash.       Residual tenderness at the left index finger at site of previous laceration and where the sutures penetrated the skin. Sutures have been removed, but there are hard areas at the entry and exit wounds where the sutures penetrated the skin.  Allergic/Immunologic: Negative.   Neurological: Negative.   Hematological: Negative.   Psychiatric/Behavioral: Negative.     Filed Vitals:   07/28/14 1524  BP: 128/80  Pulse: 69  Temp: 98.1 F (36.7 C)  TempSrc: Oral  Height: 6\' 2"  (1.88 m)  Weight: 233 lb (105.688 kg)  SpO2: 97%   Body mass index is 29.9 kg/(m^2).  Physical Exam  Constitutional: He is oriented to person, place, and time.  He appears well-developed. No distress.  overweight  HENT:  Right Ear: External ear normal.  Left Ear: External ear normal.  Nose: Nose normal.  Eyes: Conjunctivae and EOM are normal. Pupils are equal, round, and reactive to light.  Neck: Normal range of motion. Neck supple. No JVD present. No tracheal deviation present. No thyromegaly present.  Cardiovascular: Normal rate, regular rhythm, normal heart sounds and intact distal pulses.  Exam reveals no gallop and no friction rub.   No murmur heard. Pulmonary/Chest: No respiratory distress. He has no wheezes. He has no rales. He exhibits no tenderness.  Abdominal: He exhibits no distension and no mass. There is no tenderness.  Musculoskeletal: He exhibits edema (1-2 + left leg).  Left leg trace edema. Right flank discomfort since about 05/08/2014. No pain on percussion of flank.  Lymphadenopathy:    He has no cervical adenopathy.  Neurological: He is alert and oriented to person, place, and time. He has normal reflexes. No cranial nerve deficit.  Skin: Rash ( tinea pedis of the left foot) noted. No erythema. No pallor.  Tenderness and scarring at the left index finger as result of a laceration from a head tremor and subsequent suturing. This seems to have left the area with some scar tissue is deep and follows the track of the sutures. There are no indications of residual infection. I see no pus pockets or inflammation.  Psychiatric: He has a normal mood and affect. His behavior is normal. Judgment and thought content normal.     Labs reviewed: Appointment on 05/01/2014  Component Date Value Ref Range Status  . Hgb A1c MFr Bld 05/01/2014 7.2* 4.8 - 5.6 % Final   Comment:          Pre-diabetes: 5.7 - 6.4          Diabetes: >6.4          Glycemic control for adults with diabetes: <7.0   . Est. average glucose Bld gHb Est-m* 05/01/2014 160   Final  . Glucose 05/01/2014 98  65 - 99 mg/dL Final  . BUN 05/01/2014 21  8 - 27 mg/dL Final  .  Creatinine, Ser 05/01/2014 1.38* 0.76 - 1.27 mg/dL Final  . GFR calc non Af Amer 05/01/2014 55* >59 mL/min/1.73 Final  . GFR calc Af Amer 05/01/2014 63  >59 mL/min/1.73 Final  . BUN/Creatinine Ratio 05/01/2014 15  10 - 22 Final  . Sodium 05/01/2014 140  134 - 144 mmol/L Final  . Potassium 05/01/2014 4.1  3.5 - 5.2 mmol/L Final  . Chloride 05/01/2014 98  97 - 108 mmol/L Final  . CO2 05/01/2014 26  18 - 29 mmol/L Final  . Calcium 05/01/2014 10.0  8.6 - 10.2 mg/dL Final  . TSH 05/01/2014 0.521  0.450 - 4.500 uIU/mL Final  . Microalbum.,U,Random 05/01/2014 5.2  0.0 - 17.0 ug/mL Final   Comment: **Effective May 25, 2014 the reference interval for**   Microalbumin, Urine will be changing to:                                         Not Estab.   . Cholesterol, Total 05/01/2014 171  100 - 199 mg/dL Final  . Triglycerides 05/01/2014 62  0 - 149 mg/dL Final  . HDL 05/01/2014 63  >39 mg/dL Final   Comment: According to ATP-III Guidelines, HDL-C >59 mg/dL is considered a negative risk factor for CHD.   Marland Kitchen VLDL Cholesterol Cal 05/01/2014 12  5 - 40 mg/dL Final  . LDL Calculated 05/01/2014 96  0 - 99 mg/dL Final  . Chol/HDL Ratio 05/01/2014 2.7  0.0 - 5.0 ratio units Final   Comment:                                   T. Chol/HDL Ratio                                             Men  Women                               1/2 Avg.Risk  3.4    3.3                                   Avg.Risk  5.0    4.4                                2X Avg.Risk  9.6    7.1                                3X Avg.Risk 23.4   11.0      Assessment/Plan  1. Pain in finger of left hand Residual discomfort related to the hedge trimmer injury with laceration and subsequent suturing. Scar tissue seems to have formed in this area and it remains uncomfortable. There are no signs of infection. I recommended patient try Myoflex or Aspercreme 4 times daily to the finger to see if it would help the discomfort.  2. Type 2 diabetes  mellitus with diabetic chronic kidney disease - insulin glargine (LANTUS) 100 unit/mL SOPN; Inject 20 units once a day for diabetes 250.00  Dispense: 15 mL; Refill: 3  3. Essential hypertension  controlled

## 2014-08-05 ENCOUNTER — Other Ambulatory Visit: Payer: Self-pay | Admitting: Internal Medicine

## 2014-08-10 ENCOUNTER — Other Ambulatory Visit: Payer: Self-pay | Admitting: Internal Medicine

## 2014-09-07 ENCOUNTER — Other Ambulatory Visit: Payer: BLUE CROSS/BLUE SHIELD

## 2014-09-07 DIAGNOSIS — I10 Essential (primary) hypertension: Secondary | ICD-10-CM

## 2014-09-07 DIAGNOSIS — E1122 Type 2 diabetes mellitus with diabetic chronic kidney disease: Secondary | ICD-10-CM

## 2014-09-07 DIAGNOSIS — C73 Malignant neoplasm of thyroid gland: Secondary | ICD-10-CM

## 2014-09-07 DIAGNOSIS — E785 Hyperlipidemia, unspecified: Secondary | ICD-10-CM

## 2014-09-08 ENCOUNTER — Ambulatory Visit (INDEPENDENT_AMBULATORY_CARE_PROVIDER_SITE_OTHER): Payer: BLUE CROSS/BLUE SHIELD | Admitting: Internal Medicine

## 2014-09-08 ENCOUNTER — Encounter: Payer: Self-pay | Admitting: Internal Medicine

## 2014-09-08 VITALS — BP 130/72 | HR 67 | Temp 97.8°F | Resp 18 | Ht 74.0 in | Wt 233.2 lb

## 2014-09-08 DIAGNOSIS — I10 Essential (primary) hypertension: Secondary | ICD-10-CM

## 2014-09-08 DIAGNOSIS — E1122 Type 2 diabetes mellitus with diabetic chronic kidney disease: Secondary | ICD-10-CM

## 2014-09-08 DIAGNOSIS — C73 Malignant neoplasm of thyroid gland: Secondary | ICD-10-CM | POA: Diagnosis not present

## 2014-09-08 DIAGNOSIS — R609 Edema, unspecified: Secondary | ICD-10-CM | POA: Diagnosis not present

## 2014-09-08 DIAGNOSIS — E785 Hyperlipidemia, unspecified: Secondary | ICD-10-CM | POA: Diagnosis not present

## 2014-09-08 DIAGNOSIS — N189 Chronic kidney disease, unspecified: Secondary | ICD-10-CM

## 2014-09-08 DIAGNOSIS — E669 Obesity, unspecified: Secondary | ICD-10-CM

## 2014-09-08 LAB — BASIC METABOLIC PANEL
BUN/Creatinine Ratio: 12 (ref 10–22)
BUN: 17 mg/dL (ref 8–27)
CO2: 26 mmol/L (ref 18–29)
CREATININE: 1.38 mg/dL — AB (ref 0.76–1.27)
Calcium: 9.4 mg/dL (ref 8.6–10.2)
Chloride: 99 mmol/L (ref 97–108)
GFR, EST AFRICAN AMERICAN: 63 mL/min/{1.73_m2} (ref >59)
GFR, EST NON AFRICAN AMERICAN: 54 mL/min/{1.73_m2} — AB (ref >59)
Glucose: 94 mg/dL (ref 65–99)
POTASSIUM: 4 mmol/L (ref 3.5–5.2)
SODIUM: 140 mmol/L (ref 134–144)

## 2014-09-08 LAB — LIPID PANEL
CHOL/HDL RATIO: 2.3 ratio (ref 0.0–5.0)
Cholesterol, Total: 145 mg/dL (ref 100–199)
HDL: 64 mg/dL (ref >39)
LDL CALC: 69 mg/dL (ref 0–99)
TRIGLYCERIDES: 60 mg/dL (ref 0–149)
VLDL Cholesterol Cal: 12 mg/dL (ref 5–40)

## 2014-09-08 LAB — HEMOGLOBIN A1C
Est. average glucose Bld gHb Est-mCnc: 151 mg/dL
HEMOGLOBIN A1C: 6.9 % — AB (ref 4.8–5.6)

## 2014-09-08 LAB — TSH: TSH: 0.132 u[IU]/mL — AB (ref 0.450–4.500)

## 2014-09-08 MED ORDER — FUROSEMIDE 20 MG PO TABS: ORAL_TABLET | ORAL | Status: DC

## 2014-09-08 MED ORDER — INSULIN GLARGINE 100 UNITS/ML SOLOSTAR PEN: PEN_INJECTOR | SUBCUTANEOUS | Status: DC

## 2014-09-08 NOTE — Progress Notes (Signed)
Patient ID: Mitchell Gibbs, male   DOB: 04/12/1950, 64 y.o.   MRN: [id number]    Facility  [place]    Place of Service:   OFFICE    No Known Allergies  Chief Complaint  Patient presents with  . Medical Management of Chronic Issues    4 month follow-up    HPI:  Type 2 diabetes mellitus with diabetic chronic kidney disease - stable  Essential hypertension - controlled  Obesity - weight is coming down  Hyperlipidemia -controlled  Edema - controlled on furosemide  Malignant neoplasm of thyroid gland -TSH is low, but he is on expressive therapy for his thyroid cancer    Medications: Patient's Medications  New Prescriptions   No medications on file  Previous Medications   ALBUTEROL (PROVENTIL HFA;VENTOLIN HFA) 108 (90 BASE) MCG/ACT INHALER    Inhale 2 puffs into the lungs every 12 (twelve) hours. For 5 days to help with your breathing   B-D ULTRAFINE III SHORT PEN 31G X 8 MM MISC    USE WITH INSULIN PEN   CIALIS 20 MG TABLET    TAKE 1/2 TO 1 TABLET BY MOUTH PRIOR TO INTERCOURSE TO HELP WITH ERECTILE DYSFUNCTION   FUROSEMIDE (LASIX) 20 MG TABLET    TAKE 1 TABLET BY MOUTH EVERY DAY AS NEEDED TO CONTROL EDEMA   GUAIFENESIN (ROBITUSSIN) 100 MG/5ML LIQUID    Take 200 mg by mouth 3 (three) times daily as needed for cough.   INSULIN GLARGINE (LANTUS) 100 UNIT/ML SOPN    Inject 20 units once a day for diabetes 250.00   LEVOTHYROXINE (SYNTHROID, LEVOTHROID) 125 MCG TABLET    Take 125 mcg by mouth daily.   LOSARTAN-HYDROCHLOROTHIAZIDE (HYZAAR) 100-25 MG PER TABLET    TAKE 1 TABLET EVERY DAY TO CONTROL BLOOD PRESSURE   LOVASTATIN (MEVACOR) 40 MG TABLET    TAKE 1 TABLET DAILY TO CONTROL CHOLESTEROL   METFORMIN (GLUCOPHAGE) 1000 MG TABLET    TAKE 1 TABLET BY MOUTH BEFORE BREAKFAST AND ONE BEFORE SUPPER TO CONTROL BLOOD SUGAR   ONE TOUCH ULTRA TEST TEST STRIP    TEST 2 TIMES DAILY   TERBINAFINE (LAMISIL AT) 1 % CREAM    Apply daily to rash on foot   TRADJENTA 5 MG TABS TABLET    TAKE 1  TABLET BY MOUTH EVERY DAY TO CONTROL DIABETES  Modified Medications   No medications on file  Discontinued Medications   No medications on file     Review of Systems  Constitutional: Negative.   HENT: Negative.   Eyes: Negative.   Respiratory: Negative.   Cardiovascular: Positive for leg swelling (mainly the left leg). Negative for chest pain and palpitations.  Gastrointestinal: Negative for abdominal pain, abdominal distention and anal bleeding.  Endocrine: Negative for cold intolerance, heat intolerance, polydipsia, polyphagia and polyuria.       Hx thyroid malignancy and of DM.  Genitourinary: Negative.   Musculoskeletal:       Swollen left leg  Skin: Positive for rash.       Residual tenderness at the left index finger at site of previous laceration and where the sutures penetrated the skin. Sutures have been removed, but there are hard areas at the entry and exit wounds where the sutures penetrated the skin.  Allergic/Immunologic: Negative.   Neurological: Negative.   Hematological: Negative.   Psychiatric/Behavioral: Negative.     Filed Vitals:   09/08/14 1651  BP: 130/72  Pulse: 67  Temp: 97.8 F (36.6 C)  TempSrc:  Oral  Resp: 18  Height: 6\' 2"  (1.88 m)  Weight: 233 lb 3.2 oz (105.779 kg)  SpO2: 98%   Body mass index is 29.93 kg/(m^2).  Physical Exam  Constitutional: He is oriented to person, place, and time. He appears well-developed. No distress.  overweight  HENT:  Right Ear: External ear normal.  Left Ear: External ear normal.  Nose: Nose normal.  Eyes: Conjunctivae and EOM are normal. Pupils are equal, round, and reactive to light.  Neck: Normal range of motion. Neck supple. No JVD present. No tracheal deviation present. No thyromegaly present.  Cardiovascular: Normal rate, regular rhythm, normal heart sounds and intact distal pulses.  Exam reveals no gallop and no friction rub.   No murmur heard. Pulmonary/Chest: No respiratory distress. He has no  wheezes. He has no rales. He exhibits no tenderness.  Abdominal: He exhibits no distension and no mass. There is no tenderness.  Musculoskeletal: He exhibits edema (1-2 + left leg).  Left leg trace edema. Right flank discomfort since about 05/08/2014. No pain on percussion of flank.  Lymphadenopathy:    He has no cervical adenopathy.  Neurological: He is alert and oriented to person, place, and time. He has normal reflexes. No cranial nerve deficit.  Skin: Rash ( tinea pedis of the left foot) noted. No erythema. No pallor.  Tenderness and scarring at the left index finger as result of a laceration from a head tremor and subsequent suturing. This seems to have left the area with some scar tissue is deep and follows the track of the sutures. There are no indications of residual infection. I see no pus pockets or inflammation.  Psychiatric: He has a normal mood and affect. His behavior is normal. Judgment and thought content normal.     Labs reviewed: Appointment on 09/07/2014  Component Date Value Ref Range Status  . Hgb A1c MFr Bld 09/07/2014 6.9* 4.8 - 5.6 % Final   Comment:          Pre-diabetes: 5.7 - 6.4          Diabetes: >6.4          Glycemic control for adults with diabetes: <7.0   . Est. average glucose Bld gHb Est-m* 09/07/2014 151   Final  . Glucose 09/07/2014 94  65 - 99 mg/dL Final  . BUN 09/07/2014 17  8 - 27 mg/dL Final  . Creatinine, Ser 09/07/2014 1.38* 0.76 - 1.27 mg/dL Final  . GFR calc non Af Amer 09/07/2014 54* >59 mL/min/1.73 Final  . GFR calc Af Amer 09/07/2014 63  >59 mL/min/1.73 Final  . BUN/Creatinine Ratio 09/07/2014 12  10 - 22 Final  . Sodium 09/07/2014 140  134 - 144 mmol/L Final  . Potassium 09/07/2014 4.0  3.5 - 5.2 mmol/L Final  . Chloride 09/07/2014 99  97 - 108 mmol/L Final  . CO2 09/07/2014 26  18 - 29 mmol/L Final  . Calcium 09/07/2014 9.4  8.6 - 10.2 mg/dL Final  . Cholesterol, Total 09/07/2014 145  100 - 199 mg/dL Final  . Triglycerides  09/07/2014 60  0 - 149 mg/dL Final  . HDL 09/07/2014 64  >39 mg/dL Final   Comment: According to ATP-III Guidelines, HDL-C >59 mg/dL is considered a negative risk factor for CHD.   Marland Kitchen VLDL Cholesterol Cal 09/07/2014 12  5 - 40 mg/dL Final  . LDL Calculated 09/07/2014 69  0 - 99 mg/dL Final  . Chol/HDL Ratio 09/07/2014 2.3  0.0 - 5.0 ratio units Final  Comment:                                   T. Chol/HDL Ratio                                             Men  Women                               1/2 Avg.Risk  3.4    3.3                                   Avg.Risk  5.0    4.4                                2X Avg.Risk  9.6    7.1                                3X Avg.Risk 23.4   11.0   . TSH 09/07/2014 0.132* 0.450 - 4.500 uIU/mL Final     Assessment/Plan  1. Type 2 diabetes mellitus with diabetic chronic kidney disease - insulin glargine (LANTUS) 100 unit/mL SOPN; Inject 20 units once a day for diabetes 250.00  Dispense: 10 pen; Refill: 3 - Hemoglobin A1c; Future - Basic metabolic panel; Future  2. Essential hypertension - Basic metabolic panel; Future  3. Obesity Continue to work on weight loss  4. Hyperlipidemia - Lipid panel; Future  5. Edema - furosemide (LASIX) 20 MG tablet; TAKE 1 TABLET BY MOUTH EVERY DAY AS NEEDED TO CONTROL EDEMA  Dispense: 90 tablet; Refill: 4  6. Malignant neoplasm of thyroid gland Continue chronic suppressive therapy. No adjustment in dosage of levothyroxine. - TSH; Future

## 2014-10-06 ENCOUNTER — Encounter: Payer: Self-pay | Admitting: Internal Medicine

## 2014-10-06 ENCOUNTER — Ambulatory Visit (INDEPENDENT_AMBULATORY_CARE_PROVIDER_SITE_OTHER): Payer: BLUE CROSS/BLUE SHIELD | Admitting: Internal Medicine

## 2014-10-06 VITALS — BP 110/82 | HR 71 | Temp 98.3°F | Resp 18 | Ht 74.0 in | Wt 228.6 lb

## 2014-10-06 DIAGNOSIS — E1122 Type 2 diabetes mellitus with diabetic chronic kidney disease: Secondary | ICD-10-CM | POA: Diagnosis not present

## 2014-10-06 DIAGNOSIS — H8110 Benign paroxysmal vertigo, unspecified ear: Secondary | ICD-10-CM | POA: Diagnosis not present

## 2014-10-06 DIAGNOSIS — H6120 Impacted cerumen, unspecified ear: Secondary | ICD-10-CM | POA: Insufficient documentation

## 2014-10-06 DIAGNOSIS — I10 Essential (primary) hypertension: Secondary | ICD-10-CM | POA: Diagnosis not present

## 2014-10-06 DIAGNOSIS — E785 Hyperlipidemia, unspecified: Secondary | ICD-10-CM

## 2014-10-06 DIAGNOSIS — C73 Malignant neoplasm of thyroid gland: Secondary | ICD-10-CM

## 2014-10-06 DIAGNOSIS — R413 Other amnesia: Secondary | ICD-10-CM | POA: Diagnosis not present

## 2014-10-06 DIAGNOSIS — N189 Chronic kidney disease, unspecified: Secondary | ICD-10-CM | POA: Diagnosis not present

## 2014-10-06 DIAGNOSIS — H6123 Impacted cerumen, bilateral: Secondary | ICD-10-CM | POA: Diagnosis not present

## 2014-10-06 NOTE — Progress Notes (Signed)
Patient ID: Mitchell Gibbs, male   DOB: September 24, 1950, 64 y.o.   MRN: [id number]    Facility  [facility]    Place of Service:   OFFICE    No Known Allergies  Chief Complaint  Patient presents with  . Acute Visit    Dizziness / Patient instructed to make sooner appt. if symptoms increased    HPI:  Spells of dizziness that last 1-2 minutes. More frequent when he bends over and straightens up. Worse if he gets hot. No headache. Vision may have been distorted. No palpitations. No other neurologic deficits.  Medications: Patient's Medications  New Prescriptions   No medications on file  Previous Medications   ALBUTEROL (PROVENTIL HFA;VENTOLIN HFA) 108 (90 BASE) MCG/ACT INHALER    Inhale 2 puffs into the lungs every 12 (twelve) hours. For 5 days to help with your breathing   B-D ULTRAFINE III SHORT PEN 31G X 8 MM MISC    USE WITH INSULIN PEN   CIALIS 20 MG TABLET    TAKE 1/2 TO 1 TABLET BY MOUTH PRIOR TO INTERCOURSE TO HELP WITH ERECTILE DYSFUNCTION   FUROSEMIDE (LASIX) 20 MG TABLET    TAKE 1 TABLET BY MOUTH EVERY DAY AS NEEDED TO CONTROL EDEMA   GUAIFENESIN (ROBITUSSIN) 100 MG/5ML LIQUID    Take 200 mg by mouth 3 (three) times daily as needed for cough.   INSULIN GLARGINE (LANTUS) 100 UNIT/ML SOPN    Inject 20 units once a day for diabetes 250.00   LEVOTHYROXINE (SYNTHROID, LEVOTHROID) 125 MCG TABLET    Take 125 mcg by mouth daily.   LOSARTAN-HYDROCHLOROTHIAZIDE (HYZAAR) 100-25 MG PER TABLET    TAKE 1 TABLET EVERY DAY TO CONTROL BLOOD PRESSURE   LOVASTATIN (MEVACOR) 40 MG TABLET    TAKE 1 TABLET DAILY TO CONTROL CHOLESTEROL   METFORMIN (GLUCOPHAGE) 1000 MG TABLET    TAKE 1 TABLET BY MOUTH BEFORE BREAKFAST AND ONE BEFORE SUPPER TO CONTROL BLOOD SUGAR   ONE TOUCH ULTRA TEST TEST STRIP    TEST 2 TIMES DAILY   TERBINAFINE (LAMISIL AT) 1 % CREAM    Apply daily to rash on foot   TRADJENTA 5 MG TABS TABLET    TAKE 1 TABLET BY MOUTH EVERY DAY TO CONTROL DIABETES  Modified Medications   No medications  on file  Discontinued Medications   No medications on file     Review of Systems  Constitutional: Negative.   HENT: Negative.   Eyes: Negative.   Respiratory: Negative.   Cardiovascular: Positive for leg swelling (mainly the left leg). Negative for chest pain and palpitations.  Gastrointestinal: Negative for abdominal pain, abdominal distention and anal bleeding.  Endocrine: Negative for cold intolerance, heat intolerance, polydipsia, polyphagia and polyuria.       Hx thyroid malignancy and of DM.  Genitourinary: Negative.   Musculoskeletal:       Swollen left leg  Skin: Positive for rash.       Residual tenderness at the left index finger at site of previous laceration and where the sutures penetrated the skin. Sutures have been removed, but there are hard areas at the entry and exit wounds where the sutures penetrated the skin.  Allergic/Immunologic: Negative.   Neurological: Negative.   Hematological: Negative.   Psychiatric/Behavioral: Negative.     Filed Vitals:   10/06/14 1449  BP: 110/82  Pulse: 71  Temp: 98.3 F (36.8 C)  TempSrc: Oral  Resp: 18  Height: $Remove'6\' 2"'sHEqrdw$  (1.88 m)  Weight: 228 lb  9.6 oz (103.692 kg)  SpO2: 98%   Body mass index is 29.34 kg/(m^2).  Physical Exam  Constitutional: He is oriented to person, place, and time. He appears well-developed. No distress.  overweight  HENT:  Right Ear: External ear normal.  Left Ear: External ear normal.  Nose: Nose normal.  Bilateral cerumen occlusion  Eyes: Conjunctivae and EOM are normal. Pupils are equal, round, and reactive to light.  Neck: Normal range of motion. Neck supple. No JVD present. No tracheal deviation present. No thyromegaly present.  Cardiovascular: Normal rate, regular rhythm, normal heart sounds and intact distal pulses.  Exam reveals no gallop and no friction rub.   No murmur heard. Pulmonary/Chest: No respiratory distress. He has no wheezes. He has no rales. He exhibits no tenderness.    Abdominal: He exhibits no distension and no mass. There is no tenderness.  Musculoskeletal: He exhibits edema (1-2 + left leg).  Left leg trace edema.  Lymphadenopathy:    He has no cervical adenopathy.  Neurological: He is alert and oriented to person, place, and time. He has normal reflexes. No cranial nerve deficit.  Skin: Rash ( tinea pedis of the left foot) noted. No erythema. No pallor.  Psychiatric: He has a normal mood and affect. His behavior is normal. Judgment and thought content normal.     Labs reviewed: Lab Summary Latest Ref Rng 09/07/2014 05/01/2014 01/02/2014 10/14/2013  Hemoglobin 13.0-17.0 g/dL (None) (None) (None) (None)  Hematocrit 39.0-52.0 % (None) (None) (None) (None)  White count - (None) (None) (None) (None)  Platelet count - (None) (None) (None) (None)  Sodium 134 - 144 mmol/L 140 140 142 139  Potassium 3.5 - 5.2 mmol/L 4.0 4.1 4.2 4.1  Calcium 8.6 - 10.2 mg/dL 9.4 10.0 9.7 9.8  Phosphorus - (None) (None) (None) (None)  Creatinine 0.76 - 1.27 mg/dL 1.38(H) 1.38(H) 1.30(H) 1.38(H)  AST 0 - 40 IU/L (None) (None) 23 (None)  Alk Phos 39 - 117 IU/L (None) (None) 75 (None)  Bilirubin 0.0 - 1.2 mg/dL (None) (None) <0.2 (None)  Glucose 65 - 99 mg/dL 94 98 102(H) 106(H)  Cholesterol - (None) (None) (None) (None)  HDL cholesterol >39 mg/dL 64 63 (None) 59  Triglycerides 0 - 149 mg/dL 60 62 (None) 80  LDL Direct - (None) (None) (None) (None)  LDL Calc 0 - 99 mg/dL 69 96 (None) 79  Total protein - (None) (None) (None) (None)  Albumin 3.6 - 4.8 g/dL (None) (None) 4.2 (None)   Lab Results  Component Value Date   TSH 0.132* 09/07/2014   Lab Results  Component Value Date   BUN 17 09/07/2014   Lab Results  Component Value Date   HGBA1C 6.9* 09/07/2014       Assessment/Plan  1. BPPV (benign paroxysmal positional vertigo), unspecified laterality - educational preprints. Does not think he needs referral to neuro or further evaluation at this time.  2.  Cerumen occlusion removed during this visit.  3. Essential hypertension controlled  4. Type 2 diabetes mellitus with diabetic chronic kidney disease - Hemoglobin A1c; Future - Basic metabolic panel; Future  5. Hyperlipidemia - Lipid panel; Future  6. Memory loss MMSE next visit  7. Malignant neoplasm of thyroid gland - TSH; Future

## 2014-10-15 ENCOUNTER — Other Ambulatory Visit: Payer: Self-pay | Admitting: Internal Medicine

## 2014-12-15 ENCOUNTER — Telehealth: Payer: Self-pay | Admitting: *Deleted

## 2014-12-15 NOTE — Telephone Encounter (Signed)
Received fax from [employer] stating as of 01/17/15 Lantus Solostar will no longer be covered on patient's drug benefit plan. This means that they will pay the full price if they continue to use this mediation. May consult [email or web address] for additional medication options. Covered medication is:  Engineer, agricultural (will be available after 12/31/14, Levemir, Tyler Aas  Letter given to Dr. Nyoka Cowden to review.

## 2014-12-17 NOTE — Telephone Encounter (Signed)
Dr. Nyoka Cowden gave letter back and wanted me to call patient regarding switch to Levemir.  LMOM for patient to return call.

## 2014-12-25 ENCOUNTER — Other Ambulatory Visit: Payer: BLUE CROSS/BLUE SHIELD

## 2014-12-25 DIAGNOSIS — E785 Hyperlipidemia, unspecified: Secondary | ICD-10-CM

## 2014-12-25 DIAGNOSIS — C73 Malignant neoplasm of thyroid gland: Secondary | ICD-10-CM

## 2014-12-25 DIAGNOSIS — E1122 Type 2 diabetes mellitus with diabetic chronic kidney disease: Secondary | ICD-10-CM

## 2014-12-25 DIAGNOSIS — I10 Essential (primary) hypertension: Secondary | ICD-10-CM

## 2014-12-26 LAB — LIPID PANEL
CHOLESTEROL TOTAL: 176 mg/dL (ref 100–199)
Chol/HDL Ratio: 2.5 ratio units (ref 0.0–5.0)
HDL: 70 mg/dL (ref >39)
LDL Calculated: 95 mg/dL (ref 0–99)
Triglycerides: 55 mg/dL (ref 0–149)
VLDL Cholesterol Cal: 11 mg/dL (ref 5–40)

## 2014-12-26 LAB — BASIC METABOLIC PANEL
BUN/Creatinine Ratio: 12 (ref 10–22)
BUN: 17 mg/dL (ref 8–27)
CALCIUM: 9.8 mg/dL (ref 8.6–10.2)
CO2: 28 mmol/L (ref 18–29)
CREATININE: 1.44 mg/dL — AB (ref 0.76–1.27)
Chloride: 97 mmol/L (ref 97–106)
GFR calc Af Amer: 60 mL/min/{1.73_m2} (ref >59)
GFR, EST NON AFRICAN AMERICAN: 52 mL/min/{1.73_m2} — AB (ref >59)
Glucose: 75 mg/dL (ref 65–99)
Potassium: 3.7 mmol/L (ref 3.5–5.2)
Sodium: 140 mmol/L (ref 136–144)

## 2014-12-26 LAB — HEMOGLOBIN A1C
Est. average glucose Bld gHb Est-mCnc: 148 mg/dL
Hgb A1c MFr Bld: 6.8 % — ABNORMAL HIGH (ref 4.8–5.6)

## 2014-12-26 LAB — TSH: TSH: 2.78 u[IU]/mL (ref 0.450–4.500)

## 2014-12-29 ENCOUNTER — Ambulatory Visit (INDEPENDENT_AMBULATORY_CARE_PROVIDER_SITE_OTHER): Payer: BLUE CROSS/BLUE SHIELD | Admitting: Internal Medicine

## 2014-12-29 ENCOUNTER — Encounter: Payer: Self-pay | Admitting: Internal Medicine

## 2014-12-29 VITALS — BP 150/92 | HR 70 | Temp 97.8°F | Resp 18 | Ht 74.0 in | Wt 231.6 lb

## 2014-12-29 DIAGNOSIS — R609 Edema, unspecified: Secondary | ICD-10-CM | POA: Diagnosis not present

## 2014-12-29 DIAGNOSIS — Z794 Long term (current) use of insulin: Secondary | ICD-10-CM

## 2014-12-29 DIAGNOSIS — I10 Essential (primary) hypertension: Secondary | ICD-10-CM | POA: Diagnosis not present

## 2014-12-29 DIAGNOSIS — E785 Hyperlipidemia, unspecified: Secondary | ICD-10-CM

## 2014-12-29 DIAGNOSIS — C73 Malignant neoplasm of thyroid gland: Secondary | ICD-10-CM | POA: Diagnosis not present

## 2014-12-29 DIAGNOSIS — E669 Obesity, unspecified: Secondary | ICD-10-CM | POA: Diagnosis not present

## 2014-12-29 DIAGNOSIS — E1122 Type 2 diabetes mellitus with diabetic chronic kidney disease: Secondary | ICD-10-CM | POA: Diagnosis not present

## 2014-12-29 DIAGNOSIS — N182 Chronic kidney disease, stage 2 (mild): Secondary | ICD-10-CM

## 2014-12-29 MED ORDER — INSULIN PEN NEEDLE 31G X 6 MM MISC
Status: DC
Start: 2014-12-29 — End: 2016-02-11

## 2014-12-29 NOTE — Progress Notes (Signed)
Patient ID: Mitchell Gibbs, male   DOB: [birth year], 64 y.o.   MRN: [id number]    Facility  [facility]    Place of Service:   OFFICE    No Known Allergies  Chief Complaint  Patient presents with  . Medical Management of Chronic Issues    4 month follow-up for DM, Hyperlipidemia, Hypertension  . OTHER    Labs printed    HPI:  Type 2 diabetes mellitus with stage 2 chronic kidney disease, with long-term current use of insulin ([place]) - well controlled  Essential hypertension - mild elevation. Continue to observe until next visit. Patient denies headaches, palpitations, or chest pain.  Malignant neoplasm of thyroid gland (HCC) - taking 125 g levothyroxine. TSH in the normal range. He is supposed to be on chronic suppressive therapy due to previous history of malignant neoplasm.  Edema, unspecified type - resolved on furosemide  Obesity - no improvement. Has not worked out in over a month. He says he'll start again in January. He has just been too busy to go to the gym.  Hyperlipidemia - controlled    Medications: Patient's Medications  New Prescriptions   No medications on file  Previous Medications   ALBUTEROL (PROVENTIL HFA;VENTOLIN HFA) 108 (90 BASE) MCG/ACT INHALER    Inhale 2 puffs into the lungs every 12 (twelve) hours. For 5 days to help with your breathing   B-D ULTRAFINE III SHORT PEN 31G X 8 MM MISC    USE WITH INSULIN PEN   CIALIS 20 MG TABLET    TAKE 1/2 TO 1 TABLET BY MOUTH PRIOR TO INTERCOURSE TO HELP WITH ERECTILE DYSFUNCTION   FUROSEMIDE (LASIX) 20 MG TABLET    TAKE 1 TABLET BY MOUTH EVERY DAY AS NEEDED TO CONTROL EDEMA   GUAIFENESIN (ROBITUSSIN) 100 MG/5ML LIQUID    Take 200 mg by mouth 3 (three) times daily as needed for cough.   INSULIN GLARGINE (LANTUS) 100 UNIT/ML SOPN    Inject 20 units once a day for diabetes 250.00   LEVOTHYROXINE (SYNTHROID, LEVOTHROID) 125 MCG TABLET    Take 125 mcg by mouth daily.   LOSARTAN-HYDROCHLOROTHIAZIDE (HYZAAR) 100-25 MG PER TABLET     TAKE 1 TABLET EVERY DAY TO CONTROL BLOOD PRESSURE   LOVASTATIN (MEVACOR) 40 MG TABLET    TAKE 1 TABLET DAILY TO CONTROL CHOLESTEROL   METFORMIN (GLUCOPHAGE) 1000 MG TABLET    TAKE 1 TABLET BY MOUTH BEFORE BREAKFAST AND ONE BEFORE SUPPER TO CONTROL BLOOD SUGAR   ONE TOUCH ULTRA TEST TEST STRIP    TEST 2 TIMES DAILY   TERBINAFINE (LAMISIL AT) 1 % CREAM    Apply daily to rash on foot   TRADJENTA 5 MG TABS TABLET    TAKE 1 TABLET BY MOUTH EVERY DAY TO CONTROL DIABETES  Modified Medications   No medications on file  Discontinued Medications   No medications on file    Review of Systems  Constitutional: Negative.  Negative for fever, activity change, appetite change, fatigue and unexpected weight change.  HENT: Negative.  Negative for congestion, ear pain, hearing loss, rhinorrhea, sore throat, tinnitus, trouble swallowing and voice change.   Eyes: Negative.        Corrective lenses  Respiratory: Negative.  Negative for cough, choking, chest tightness, shortness of breath and wheezing.   Cardiovascular: Positive for leg swelling (mainly the left leg). Negative for chest pain and palpitations.  Gastrointestinal: Negative for nausea, abdominal pain, diarrhea, constipation, abdominal distention and anal bleeding.  Endocrine:  Negative for cold intolerance, heat intolerance, polydipsia, polyphagia and polyuria.       Hx thyroid malignancy and of DM.  Genitourinary: Negative.  Negative for dysuria, urgency, frequency and testicular pain.       Not incontinent  Musculoskeletal: Negative for myalgias, back pain, joint swelling, arthralgias, gait problem and neck pain.  Skin: Positive for rash. Negative for color change and pallor.       Residual tenderness at the left index finger at site of previous laceration and where the sutures penetrated the skin. Sutures have been removed, but there are hard areas at the entry and exit wounds where the sutures penetrated the skin.  Allergic/Immunologic:  Negative.   Neurological: Negative for dizziness, tremors, syncope, speech difficulty, weakness, numbness and headaches.  Hematological: Negative for adenopathy. Does not bruise/bleed easily.  Psychiatric/Behavioral: Negative for hallucinations, behavioral problems, confusion, sleep disturbance and decreased concentration. The patient is not nervous/anxious.     Filed Vitals:   12/29/14 1611  BP: 150/92  Pulse: 70  Temp: 97.8 F (36.6 C)  TempSrc: Oral  Resp: 18  Height: $Remove'6\' 2"'BLIzozn$  (1.88 m)  Weight: 231 lb 9.6 oz (105.053 kg)  SpO2: 98%   Body mass index is 29.72 kg/(m^2).  Physical Exam  Constitutional: He is oriented to person, place, and time. He appears well-developed. No distress.  overweight  HENT:  Right Ear: External ear normal.  Left Ear: External ear normal.  Nose: Nose normal.  Bilateral cerumen occlusion  Eyes: Conjunctivae and EOM are normal. Pupils are equal, round, and reactive to light.  Neck: Normal range of motion. Neck supple. No JVD present. No tracheal deviation present. No thyromegaly present.  Cardiovascular: Normal rate, regular rhythm, normal heart sounds and intact distal pulses.  Exam reveals no gallop and no friction rub.   No murmur heard. Pulmonary/Chest: No respiratory distress. He has no wheezes. He has no rales. He exhibits no tenderness.  Abdominal: He exhibits no distension and no mass. There is no tenderness.  Musculoskeletal: He exhibits no edema.  Left leg trace edema.  Lymphadenopathy:    He has no cervical adenopathy.  Neurological: He is alert and oriented to person, place, and time. He has normal reflexes. No cranial nerve deficit.  Skin: Rash ( tinea pedis of the left foot) noted. No erythema. No pallor.  Psychiatric: He has a normal mood and affect. His behavior is normal. Judgment and thought content normal.    Labs reviewed: Lab Summary Latest Ref Rng 12/25/2014 09/07/2014 05/01/2014 01/02/2014  Hemoglobin 13.0-17.0 g/dL (None) (None)  (None) (None)  Hematocrit 39.0-52.0 % (None) (None) (None) (None)  White count - (None) (None) (None) (None)  Platelet count - (None) (None) (None) (None)  Sodium 136 - 144 mmol/L 140 140 140 142  Potassium 3.5 - 5.2 mmol/L 3.7 4.0 4.1 4.2  Calcium 8.6 - 10.2 mg/dL 9.8 9.4 10.0 9.7  Phosphorus - (None) (None) (None) (None)  Creatinine 0.76 - 1.27 mg/dL 1.44(H) 1.38(H) 1.38(H) 1.30(H)  AST 0 - 40 IU/L (None) (None) (None) 23  Alk Phos 39 - 117 IU/L (None) (None) (None) 75  Bilirubin 0.0 - 1.2 mg/dL (None) (None) (None) <0.2  Glucose 65 - 99 mg/dL 75 94 98 102(H)  Cholesterol - (None) (None) (None) (None)  HDL cholesterol >39 mg/dL 70 64 63 (None)  Triglycerides 0 - 149 mg/dL 55 60 62 (None)  LDL Direct - (None) (None) (None) (None)  LDL Calc 0 - 99 mg/dL 95 69 96 (None)  Total protein - (  None) (None) (None) (None)  Albumin 3.6 - 4.8 g/dL (None) (None) (None) 4.2   Lab Results  Component Value Date   TSH 2.780 12/25/2014   Lab Results  Component Value Date   BUN 17 12/25/2014   Lab Results  Component Value Date   HGBA1C 6.8* 12/25/2014    Assessment/Plan  1. Type 2 diabetes mellitus with stage 2 chronic kidney disease, with long-term current use of insulin (HCC) - Hemoglobin A1c; Future - Comprehensive metabolic panel; Future - Microalbumin, urine; Future - Insulin Pen Needle 31G X 6 MM MISC; Use for insulin injection  Dispense: 100 each; Refill: 3  2. Essential hypertension - Comprehensive metabolic panel; Future  3. Malignant neoplasm of thyroid gland (HCC) - TSH; Future  4. Edema, unspecified type Resolved on furosemide  5. Obesity Encouraged to resume working out at the gym   6. Hyperlipidemia - Lipid panel

## 2015-02-04 ENCOUNTER — Other Ambulatory Visit: Payer: Self-pay | Admitting: Internal Medicine

## 2015-03-14 ENCOUNTER — Other Ambulatory Visit: Payer: Self-pay | Admitting: Internal Medicine

## 2015-05-03 ENCOUNTER — Other Ambulatory Visit: Payer: BLUE CROSS/BLUE SHIELD

## 2015-05-03 DIAGNOSIS — C73 Malignant neoplasm of thyroid gland: Secondary | ICD-10-CM

## 2015-05-03 DIAGNOSIS — E1122 Type 2 diabetes mellitus with diabetic chronic kidney disease: Secondary | ICD-10-CM

## 2015-05-03 DIAGNOSIS — I10 Essential (primary) hypertension: Secondary | ICD-10-CM | POA: Diagnosis not present

## 2015-05-03 DIAGNOSIS — Z794 Long term (current) use of insulin: Secondary | ICD-10-CM | POA: Diagnosis not present

## 2015-05-03 DIAGNOSIS — N182 Chronic kidney disease, stage 2 (mild): Secondary | ICD-10-CM | POA: Diagnosis not present

## 2015-05-04 LAB — COMPREHENSIVE METABOLIC PANEL
A/G RATIO: 1.5 (ref 1.2–2.2)
ALT: 20 IU/L (ref 0–44)
AST: 26 IU/L (ref 0–40)
Albumin: 4.3 g/dL (ref 3.6–4.8)
Alkaline Phosphatase: 58 IU/L (ref 39–117)
BILIRUBIN TOTAL: 0.4 mg/dL (ref 0.0–1.2)
BUN/Creatinine Ratio: 14 (ref 10–24)
BUN: 18 mg/dL (ref 8–27)
CALCIUM: 9.8 mg/dL (ref 8.6–10.2)
CHLORIDE: 98 mmol/L (ref 96–106)
CO2: 28 mmol/L (ref 18–29)
Creatinine, Ser: 1.27 mg/dL (ref 0.76–1.27)
GFR, EST AFRICAN AMERICAN: 70 mL/min/{1.73_m2} (ref >59)
GFR, EST NON AFRICAN AMERICAN: 60 mL/min/{1.73_m2} (ref >59)
GLOBULIN, TOTAL: 2.8 g/dL (ref 1.5–4.5)
Glucose: 102 mg/dL — ABNORMAL HIGH (ref 65–99)
POTASSIUM: 4 mmol/L (ref 3.5–5.2)
SODIUM: 142 mmol/L (ref 134–144)
TOTAL PROTEIN: 7.1 g/dL (ref 6.0–8.5)

## 2015-05-04 LAB — HEMOGLOBIN A1C
ESTIMATED AVERAGE GLUCOSE: 180 mg/dL
HEMOGLOBIN A1C: 7.9 % — AB (ref 4.8–5.6)

## 2015-05-04 LAB — MICROALBUMIN, URINE: Microalbumin, Urine: <3 ug/mL

## 2015-05-04 LAB — TSH: TSH: 1.03 u[IU]/mL (ref 0.450–4.500)

## 2015-05-05 ENCOUNTER — Encounter: Payer: Self-pay | Admitting: Internal Medicine

## 2015-05-05 ENCOUNTER — Ambulatory Visit (INDEPENDENT_AMBULATORY_CARE_PROVIDER_SITE_OTHER): Payer: BLUE CROSS/BLUE SHIELD | Admitting: Internal Medicine

## 2015-05-05 VITALS — BP 116/84 | HR 63 | Temp 97.8°F | Ht 75.0 in | Wt 237.0 lb

## 2015-05-05 DIAGNOSIS — Z794 Long term (current) use of insulin: Secondary | ICD-10-CM

## 2015-05-05 DIAGNOSIS — E669 Obesity, unspecified: Secondary | ICD-10-CM

## 2015-05-05 DIAGNOSIS — E1122 Type 2 diabetes mellitus with diabetic chronic kidney disease: Secondary | ICD-10-CM | POA: Diagnosis not present

## 2015-05-05 DIAGNOSIS — C73 Malignant neoplasm of thyroid gland: Secondary | ICD-10-CM

## 2015-05-05 DIAGNOSIS — E785 Hyperlipidemia, unspecified: Secondary | ICD-10-CM | POA: Diagnosis not present

## 2015-05-05 DIAGNOSIS — I10 Essential (primary) hypertension: Secondary | ICD-10-CM | POA: Diagnosis not present

## 2015-05-05 DIAGNOSIS — N181 Chronic kidney disease, stage 1: Secondary | ICD-10-CM | POA: Diagnosis not present

## 2015-05-05 DIAGNOSIS — R609 Edema, unspecified: Secondary | ICD-10-CM | POA: Diagnosis not present

## 2015-05-05 MED ORDER — ONETOUCH VERIO FLEX SYSTEM W/DEVICE KIT: 1.0000 | PACK | Freq: Every day | Status: DC

## 2015-05-05 MED ORDER — INSULIN GLARGINE 100 UNITS/ML SOLOSTAR PEN: PEN_INJECTOR | SUBCUTANEOUS | Status: DC

## 2015-05-05 NOTE — Progress Notes (Signed)
Patient ID: Mitchell Gibbs, male   DOB: [birth year], 65 y.o.   MRN: [id number]    Facility  [facility]    Place of Service:   OFFICE    No Known Allergies  Chief Complaint  Patient presents with  . Medical Management of Chronic Issues    4 month OV blood sugar, blood pressure, edema, cholesterol, review labs    HPI:  Type 2 diabetes mellitus with stage 1 chronic kidney disease, with long-term current use of insulin (HCC) - Tolerating insulin. He is on several other medications. He discontinued glipizide per previous recommendations. He remains on metformin and Tradjenta. He is interested in reducing medications. Recent labs showed a nearly normal fasting glucose, but the hemoglobin A1c was increased to 7.9.  Edema, unspecified type - chronic edema of the legs seems to have increased recently. Denies change in diet.    Hyperlipidemia - controlled   Essential hypertension - controlled                 Malignant neoplasm of thyroid gland (HCCfollowed by - recent TSH normal  Obese  - patient is attempting to follow a diet better except for some chocolate that he found at work. He has been eating these regularly and thinks it may have something to do with the rise in the hemoglobin A1c. He has gained 6 pounds since his last visit, but the edema of the legs is more than in the past.    Medications: Patient's Medications  New Prescriptions   No medications on file  Previous Medications   ALBUTEROL (PROVENTIL HFA;VENTOLIN HFA) 108 (90 BASE) MCG/ACT INHALER    Inhale 2 puffs into the lungs every 12 (twelve) hours. For 5 days to help with your breathing   CIALIS 20 MG TABLET    TAKE 1/2 TO 1 TABLET BY MOUTH PRIOR TO INTERCOURSE TO HELP WITH ERECTILE DYSFUNCTION   FUROSEMIDE (LASIX) 20 MG TABLET    TAKE 1 TABLET BY MOUTH EVERY DAY AS NEEDED TO CONTROL EDEMA   GLIPIZIDE (GLUCOTROL) 10 MG TABLET    TAKE 1 TABLET BY MOUTH EVERY MORNING AND BEFORE SUPPER TO CONTROL DIABETES   GUAIFENESIN (ROBITUSSIN)  100 MG/5ML LIQUID    Take 200 mg by mouth 3 (three) times daily as needed for cough.   INSULIN GLARGINE (LANTUS) 100 UNIT/ML SOPN    Inject 20 units once a day for diabetes 250.00   INSULIN PEN NEEDLE 31G X 6 MM MISC    Use for insulin injection   LEVOTHYROXINE (SYNTHROID, LEVOTHROID) 125 MCG TABLET    Take 125 mcg by mouth daily.   LOSARTAN-HYDROCHLOROTHIAZIDE (HYZAAR) 100-25 MG TABLET    TAKE 1 TABLET EVERY DAY TO CONTROL BLOOD PRESSURE   LOVASTATIN (MEVACOR) 40 MG TABLET    TAKE 1 TABLET DAILY TO CONTROL CHOLESTEROL   METFORMIN (GLUCOPHAGE) 1000 MG TABLET    TAKE 1 TABLET BY MOUTH BEFORE BREAKFAST AND ONE BEFORE SUPPER TO CONTROL BLOOD SUGAR   ONE TOUCH ULTRA TEST TEST STRIP    TEST 2 TIMES DAILY   TERBINAFINE (LAMISIL AT) 1 % CREAM    Apply daily to rash on foot   TRADJENTA 5 MG TABS TABLET    TAKE 1 TABLET BY MOUTH EVERY DAY TO CONTROL DIABETES  Modified Medications   No medications on file  Discontinued Medications   No medications on file    Review of Systems  Constitutional: Negative.  Negative for fever, activity change, appetite change, fatigue and unexpected weight  change.  HENT: Negative.  Negative for congestion, ear pain, hearing loss, rhinorrhea, sore throat, tinnitus, trouble swallowing and voice change.   Eyes: Negative.        Corrective lenses  Respiratory: Negative.  Negative for cough, choking, chest tightness, shortness of breath and wheezing.   Cardiovascular: Positive for leg swelling (mainly the left leg). Negative for chest pain and palpitations.  Gastrointestinal: Negative for nausea, abdominal pain, diarrhea, constipation, abdominal distention and anal bleeding.  Endocrine: Negative for cold intolerance, heat intolerance, polydipsia, polyphagia and polyuria.       Hx thyroid malignancy and of DM.  Genitourinary: Negative.  Negative for dysuria, urgency, frequency and testicular pain.       Not incontinent  Musculoskeletal: Negative for myalgias, back pain,  joint swelling, arthralgias, gait problem and neck pain.  Skin: Positive for rash. Negative for color change and pallor.       Residual tenderness at the left index finger at site of previous laceration and where the sutures penetrated the skin. Sutures have been removed, but there are hard areas at the entry and exit wounds where the sutures penetrated the skin.  Allergic/Immunologic: Negative.   Neurological: Negative for dizziness, tremors, syncope, speech difficulty, weakness, numbness and headaches.  Hematological: Negative for adenopathy. Does not bruise/bleed easily.  Psychiatric/Behavioral: Negative for hallucinations, behavioral problems, confusion, sleep disturbance and decreased concentration. The patient is not nervous/anxious.     Filed Vitals:   05/05/15 1658  BP: 116/84  Pulse: 63  Temp: 97.8 F (36.6 C)  TempSrc: Oral  Height: '6\' 3"'$  (1.905 m)  Weight: 237 lb (107.502 kg)  SpO2: 97%   Body mass index is 29.62 kg/(m^2). Filed Weights   05/05/15 1658  Weight: 237 lb (107.502 kg)     Physical Exam  Constitutional: He is oriented to person, place, and time. He appears well-developed. No distress.  overweight  HENT:  Right Ear: External ear normal.  Left Ear: External ear normal.  Nose: Nose normal.  Bilateral cerumen occlusion  Eyes: Conjunctivae and EOM are normal. Pupils are equal, round, and reactive to light.  Neck: Normal range of motion. Neck supple. No JVD present. No tracheal deviation present. No thyromegaly present.  Cardiovascular: Normal rate, regular rhythm, normal heart sounds and intact distal pulses.  Exam reveals no gallop and no friction rub.   No murmur heard. Pulmonary/Chest: No respiratory distress. He has no wheezes. He has no rales. He exhibits no tenderness.  Abdominal: He exhibits no distension and no mass. There is no tenderness.  Musculoskeletal: He exhibits edema (Bilateral lower legs and feet).  Left leg trace edema.  Lymphadenopathy:     He has no cervical adenopathy.  Neurological: He is alert and oriented to person, place, and time. He has normal reflexes. No cranial nerve deficit.  Skin: Rash ( tinea pedis of the left foot) noted. No erythema. No pallor.  Psychiatric: He has a normal mood and affect. His behavior is normal. Judgment and thought content normal.    Labs reviewed: Lab Summary Latest Ref Rng 05/03/2015 12/25/2014 09/07/2014 05/01/2014  Hemoglobin 13.0-17.0 g/dL (None) (None) (None) (None)  Hematocrit 39.0-52.0 % (None) (None) (None) (None)  White count - (None) (None) (None) (None)  Platelet count - (None) (None) (None) (None)  Sodium 134 - 144 mmol/L 142 140 140 140  Potassium 3.5 - 5.2 mmol/L 4.0 3.7 4.0 4.1  Calcium 8.6 - 10.2 mg/dL 9.8 9.8 9.4 10.0  Phosphorus - (None) (None) (None) (None)  Creatinine 0.76 -  1.27 mg/dL 1.27 1.44(H) 1.38(H) 1.38(H)  AST 0 - 40 IU/L 26 (None) (None) (None)  Alk Phos 39 - 117 IU/L 58 (None) (None) (None)  Bilirubin 0.0 - 1.2 mg/dL 0.4 (None) (None) (None)  Glucose 65 - 99 mg/dL 102(H) 75 94 98  Cholesterol - (None) (None) (None) (None)  HDL cholesterol >39 mg/dL (None) 70 64 63  Triglycerides 0 - 149 mg/dL (None) 55 60 62  LDL Direct - (None) (None) (None) (None)  LDL Calc 0 - 99 mg/dL (None) 95 69 96  Total protein - (None) (None) (None) (None)  Albumin 3.6 - 4.8 g/dL 4.3 (None) (None) (None)   Lab Results  Component Value Date   TSH 1.030 05/03/2015   TSH 2.780 12/25/2014   TSH 0.132* 09/07/2014   Lab Results  Component Value Date   BUN 18 05/03/2015   BUN 17 12/25/2014   BUN 17 09/07/2014   Lab Results  Component Value Date   HGBA1C 7.9* 05/03/2015   HGBA1C 6.8* 12/25/2014   HGBA1C 6.9* 09/07/2014    Assessment/Plan  1. Type 2 diabetes mellitus with stage 1 chronic kidney disease, with long-term current use of insulin (HCC) -Discontinue Tradjenta and increase Lantus  - Blood Glucose Monitoring Suppl (ONETOUCH VERIO FLEX SYSTEM) w/Device KIT; 1  kit by Does not apply route daily.  Dispense: 1 kit; Refill: 0 - insulin glargine (LANTUS) 100 unit/mL SOPN; Inject 25 units once a day for diabetes  Dispense: 10 pen; Refill: 3 - Hemoglobin A1c; Future - Basic metabolic panel; Future  2. Edema, unspecified type Slight increased bilaterally. Continue current dose of furosemide  3. Hyperlipidemia Controlled  4. Essential hypertension Controlled  5. Malignant neoplasm of thyroid gland (HCC) stable - TSH; Future  6. Obesity Continue to strive to improve diet

## 2015-05-19 ENCOUNTER — Telehealth: Payer: Self-pay

## 2015-05-19 NOTE — Telephone Encounter (Signed)
Left message, we checked our old records, there wasn't anything records to show that he has had a colonoscopy. If he can find anything with date or doctors name on it, please let us know, other wise we will need to make an appointment for colonoscopy.

## 2015-06-01 ENCOUNTER — Other Ambulatory Visit: Payer: Self-pay | Admitting: Internal Medicine

## 2015-06-10 ENCOUNTER — Other Ambulatory Visit: Payer: Self-pay | Admitting: Internal Medicine

## 2015-06-18 NOTE — Telephone Encounter (Signed)
Left another message for patient [phone], if he has any information on when he had his last colonoscopy, bring it with him on his appt with Dr. Nyoka Cowden 07/28/15, or he could call back.

## 2015-07-05 ENCOUNTER — Telehealth: Payer: Self-pay | Admitting: Internal Medicine

## 2015-07-05 NOTE — Telephone Encounter (Signed)
Patient Returned Mitchell Gibbs call in regards to Colonoscopy - Patient has NOT had a colonoscopy done before

## 2015-07-07 ENCOUNTER — Other Ambulatory Visit: Payer: Self-pay | Admitting: *Deleted

## 2015-07-07 MED ORDER — METFORMIN HCL 1000 MG PO TABS: ORAL_TABLET | ORAL | Status: DC

## 2015-07-07 NOTE — Telephone Encounter (Signed)
Patient requested #[phone] due to insurance

## 2015-07-22 ENCOUNTER — Other Ambulatory Visit: Payer: Self-pay | Admitting: Internal Medicine

## 2015-07-22 DIAGNOSIS — C73 Malignant neoplasm of thyroid gland: Secondary | ICD-10-CM

## 2015-07-22 DIAGNOSIS — N182 Chronic kidney disease, stage 2 (mild): Secondary | ICD-10-CM

## 2015-07-22 DIAGNOSIS — Z794 Long term (current) use of insulin: Secondary | ICD-10-CM

## 2015-07-22 DIAGNOSIS — N181 Chronic kidney disease, stage 1: Secondary | ICD-10-CM

## 2015-07-22 DIAGNOSIS — E1122 Type 2 diabetes mellitus with diabetic chronic kidney disease: Secondary | ICD-10-CM

## 2015-07-26 ENCOUNTER — Other Ambulatory Visit: Payer: BLUE CROSS/BLUE SHIELD

## 2015-07-26 DIAGNOSIS — E1122 Type 2 diabetes mellitus with diabetic chronic kidney disease: Secondary | ICD-10-CM

## 2015-07-26 DIAGNOSIS — N181 Chronic kidney disease, stage 1: Secondary | ICD-10-CM

## 2015-07-26 DIAGNOSIS — N182 Chronic kidney disease, stage 2 (mild): Principal | ICD-10-CM

## 2015-07-26 DIAGNOSIS — C73 Malignant neoplasm of thyroid gland: Secondary | ICD-10-CM | POA: Diagnosis not present

## 2015-07-26 DIAGNOSIS — Z794 Long term (current) use of insulin: Secondary | ICD-10-CM

## 2015-07-26 LAB — BASIC METABOLIC PANEL
BUN: 16 mg/dL (ref 7–25)
CALCIUM: 9.4 mg/dL (ref 8.6–10.3)
CO2: 29 mmol/L (ref 20–31)
CREATININE: 1.28 mg/dL — AB (ref 0.70–1.25)
Chloride: 100 mmol/L (ref 98–110)
Glucose, Bld: 88 mg/dL (ref 65–99)
Potassium: 4 mmol/L (ref 3.5–5.3)
SODIUM: 138 mmol/L (ref 135–146)

## 2015-07-26 LAB — TSH: TSH: 1.12 m[IU]/L (ref 0.40–4.50)

## 2015-07-26 LAB — HEMOGLOBIN A1C
HEMOGLOBIN A1C: 7.2 % — AB (ref <5.7)
MEAN PLASMA GLUCOSE: 160 mg/dL

## 2015-07-28 ENCOUNTER — Ambulatory Visit (INDEPENDENT_AMBULATORY_CARE_PROVIDER_SITE_OTHER): Payer: BLUE CROSS/BLUE SHIELD | Admitting: Internal Medicine

## 2015-07-28 ENCOUNTER — Encounter: Payer: Self-pay | Admitting: Internal Medicine

## 2015-07-28 VITALS — BP 124/82 | HR 79 | Temp 98.3°F | Ht 75.0 in | Wt 239.0 lb

## 2015-07-28 DIAGNOSIS — N182 Chronic kidney disease, stage 2 (mild): Secondary | ICD-10-CM | POA: Diagnosis not present

## 2015-07-28 DIAGNOSIS — E669 Obesity, unspecified: Secondary | ICD-10-CM | POA: Diagnosis not present

## 2015-07-28 DIAGNOSIS — Z0000 Encounter for general adult medical examination without abnormal findings: Secondary | ICD-10-CM | POA: Diagnosis not present

## 2015-07-28 DIAGNOSIS — C73 Malignant neoplasm of thyroid gland: Secondary | ICD-10-CM | POA: Diagnosis not present

## 2015-07-28 DIAGNOSIS — H6123 Impacted cerumen, bilateral: Secondary | ICD-10-CM

## 2015-07-28 DIAGNOSIS — R609 Edema, unspecified: Secondary | ICD-10-CM | POA: Diagnosis not present

## 2015-07-28 DIAGNOSIS — E785 Hyperlipidemia, unspecified: Secondary | ICD-10-CM

## 2015-07-28 DIAGNOSIS — Z794 Long term (current) use of insulin: Secondary | ICD-10-CM | POA: Diagnosis not present

## 2015-07-28 DIAGNOSIS — E1122 Type 2 diabetes mellitus with diabetic chronic kidney disease: Secondary | ICD-10-CM | POA: Diagnosis not present

## 2015-07-28 DIAGNOSIS — I10 Essential (primary) hypertension: Secondary | ICD-10-CM | POA: Diagnosis not present

## 2015-07-28 DIAGNOSIS — H6120 Impacted cerumen, unspecified ear: Secondary | ICD-10-CM | POA: Insufficient documentation

## 2015-07-28 MED ORDER — METFORMIN HCL 1000 MG PO TABS: ORAL_TABLET | ORAL | Status: DC

## 2015-07-28 NOTE — Progress Notes (Signed)
Patient ID: Mitchell Gibbs, male   DOB: 1950/07/28, 65 y.o.   MRN: [id number]    Facility  [facility]    Place of Service:   OFFICE    No Known Allergies  Chief Complaint  Patient presents with  . Medical Management of Chronic Issues    3 month medication management blood sugar, blood pressure, edema, cholesterol. Review labs.     HPI:  Type 2 diabetes mellitus with stage 2 chronic kidney disease, with long-term current use of insulin (HCC) - better control  Edema, unspecified type - left leg. Unchanged.  Hyperlipidemia - controlled  Essential hypertension - controlled  Obesity - unchanged  Malignant neoplasm of thyroid gland (HCC) - stable. On supplement for suppression.    Medications: Patient's Medications  New Prescriptions   No medications on file  Previous Medications   ALBUTEROL (PROVENTIL HFA;VENTOLIN HFA) 108 (90 BASE) MCG/ACT INHALER    Inhale 2 puffs into the lungs every 12 (twelve) hours. For 5 days to help with your breathing   BLOOD GLUCOSE MONITORING SUPPL (ONETOUCH VERIO FLEX SYSTEM) W/DEVICE KIT    1 kit by Does not apply route daily.   CIALIS 20 MG TABLET    TAKE 1/2 TO 1 TABLET BY MOUTH PRIOR TO INTERCOURSE TO HELP WITH ERECTILE DYSFUNCTION   FUROSEMIDE (LASIX) 20 MG TABLET    TAKE 1 TABLET BY MOUTH EVERY DAY AS NEEDED TO CONTROL EDEMA   GUAIFENESIN (ROBITUSSIN) 100 MG/5ML LIQUID    Take 200 mg by mouth 3 (three) times daily as needed for cough.   INSULIN GLARGINE (LANTUS) 100 UNIT/ML SOPN    Inject 25 units once a day for diabetes   INSULIN PEN NEEDLE 31G X 6 MM MISC    Use for insulin injection   LEVOTHYROXINE (SYNTHROID, LEVOTHROID) 125 MCG TABLET    Take 125 mcg by mouth daily.   LOSARTAN-HYDROCHLOROTHIAZIDE (HYZAAR) 100-25 MG TABLET    TAKE 1 TABLET EVERY DAY TO CONTROL BLOOD PRESSURE   LOVASTATIN (MEVACOR) 40 MG TABLET    TAKE 1 TABLET DAILY TO CONTROL CHOLESTEROL   METFORMIN (GLUCOPHAGE) 1000 MG TABLET    Take one tablet by mouth before breakfast and  one before supper to control blood sugar   ONE TOUCH ULTRA TEST TEST STRIP    TEST 2 TIMES DAILY   TERBINAFINE (LAMISIL AT) 1 % CREAM    Apply daily to rash on foot  Modified Medications   No medications on file  Discontinued Medications   No medications on file    Review of Systems  Constitutional: Negative.  Negative for fever, activity change, appetite change, fatigue and unexpected weight change.  HENT: Negative.  Negative for congestion, ear pain, hearing loss, rhinorrhea, sore throat, tinnitus, trouble swallowing and voice change.   Eyes: Negative.        Corrective lenses  Respiratory: Negative.  Negative for cough, choking, chest tightness, shortness of breath and wheezing.   Cardiovascular: Positive for leg swelling (mainly the left leg). Negative for chest pain and palpitations.  Gastrointestinal: Negative for nausea, abdominal pain, diarrhea, constipation, abdominal distention and anal bleeding.  Endocrine: Negative for cold intolerance, heat intolerance, polydipsia, polyphagia and polyuria.       Hx thyroid malignancy and of DM.  Genitourinary: Negative.  Negative for dysuria, urgency, frequency and testicular pain.       Not incontinent  Musculoskeletal: Negative for myalgias, back pain, joint swelling, arthralgias, gait problem and neck pain.  Skin: Negative for color change, pallor  and rash.       Residual tenderness at the left index finger at site of previous laceration.  Allergic/Immunologic: Negative.   Neurological: Negative for dizziness, tremors, syncope, speech difficulty, weakness, numbness and headaches.  Hematological: Negative for adenopathy. Does not bruise/bleed easily.  Psychiatric/Behavioral: Negative for hallucinations, behavioral problems, confusion, sleep disturbance and decreased concentration. The patient is not nervous/anxious.     Filed Vitals:   07/28/15 1619  BP: 124/82  Pulse: 79  Temp: 98.3 F (36.8 C)  TempSrc: Oral  Height: '6\' 3"'$  (1.905  m)  Weight: 239 lb (108.41 kg)  SpO2: 98%   Body mass index is 29.87 kg/(m^2). Wt Readings from Last 3 Encounters:  07/28/15 239 lb (108.41 kg)  05/05/15 237 lb (107.502 kg)  12/29/14 231 lb 9.6 oz (105.053 kg)      Physical Exam  Constitutional: He is oriented to person, place, and time. He appears well-developed. No distress.  overweight  HENT:  Right Ear: External ear normal.  Left Ear: External ear normal.  Nose: Nose normal.  Bilateral cerumen occlusion  Eyes: Conjunctivae and EOM are normal. Pupils are equal, round, and reactive to light.  Neck: Normal range of motion. Neck supple. No JVD present. No tracheal deviation present. No thyromegaly present.  Cardiovascular: Normal rate, regular rhythm, normal heart sounds and intact distal pulses.  Exam reveals no gallop and no friction rub.   No murmur heard. Pulmonary/Chest: No respiratory distress. He has no wheezes. He has no rales. He exhibits no tenderness.  Abdominal: He exhibits no distension and no mass. There is no tenderness.  Musculoskeletal: He exhibits edema (Left leg trace edema.).  Lymphadenopathy:    He has no cervical adenopathy.  Neurological: He is alert and oriented to person, place, and time. He has normal reflexes. No cranial nerve deficit.  Skin: No rash noted. No erythema. No pallor.  Psychiatric: He has a normal mood and affect. His behavior is normal. Judgment and thought content normal.    Labs reviewed: Lab Summary Latest Ref Rng 07/26/2015 05/03/2015 12/25/2014 09/07/2014  Hemoglobin 13.0-17.0 g/dL (None) (None) (None) (None)  Hematocrit 39.0-52.0 % (None) (None) (None) (None)  White count - (None) (None) (None) (None)  Platelet count - (None) (None) (None) (None)  Sodium 135 - 146 mmol/L 138 142 140 140  Potassium 3.5 - 5.3 mmol/L 4.0 4.0 3.7 4.0  Calcium 8.6 - 10.3 mg/dL 9.4 9.8 9.8 9.4  Phosphorus - (None) (None) (None) (None)  Creatinine 0.70 - 1.25 mg/dL 1.28(H) 1.27 1.44(H) 1.38(H)  AST 0  - 40 IU/L (None) 26 (None) (None)  Alk Phos 39 - 117 IU/L (None) 58 (None) (None)  Bilirubin 0.0 - 1.2 mg/dL (None) 0.4 (None) (None)  Glucose 65 - 99 mg/dL 88 102(H) 75 94  Cholesterol - (None) (None) (None) (None)  HDL cholesterol >39 mg/dL (None) (None) 70 64  Triglycerides 0 - 149 mg/dL (None) (None) 55 60  LDL Direct - (None) (None) (None) (None)  LDL Calc 0 - 99 mg/dL (None) (None) 95 69  Total protein - (None) (None) (None) (None)  Albumin 3.6 - 4.8 g/dL (None) 4.3 (None) (None)   Lab Results  Component Value Date   TSH 1.12 07/26/2015   TSH 1.030 05/03/2015   TSH 2.780 12/25/2014   Lab Results  Component Value Date   BUN 16 07/26/2015   BUN 18 05/03/2015   BUN 17 12/25/2014   Lab Results  Component Value Date   HGBA1C 7.2* 07/26/2015   HGBA1C  7.9* 05/03/2015   HGBA1C 6.8* 12/25/2014    Assessment/Plan  1. Type 2 diabetes mellitus with stage 2 chronic kidney disease, with long-term current use of insulin ([place]) -continue current insulin dose. Change to Levemir  - metFORMIN (GLUCOPHAGE) 1000 MG tablet; Take one tablet by mouth before breakfast and one before supper to control blood sugar  Dispense: 180 tablet; Refill: 3 - Hemoglobin A1c; Future  2. Edema, unspecified type nchanged  3. Hyperlipidemia - Lipid panel; Future  4. Essential hypertension controlled  5. Obesity Emphasized dietary control  6. Malignant neoplasm of thyroid gland (HCC) - TSH; Future  7. Cerumen impaction Lavaged and removed

## 2015-07-28 NOTE — Addendum Note (Signed)
Addended by: Estill Dooms on: 07/28/2015 05:00 PM   Modules accepted: Orders

## 2015-07-29 ENCOUNTER — Encounter: Payer: Self-pay | Admitting: Gastroenterology

## 2015-08-05 ENCOUNTER — Encounter: Payer: Self-pay | Admitting: Internal Medicine

## 2015-08-06 ENCOUNTER — Other Ambulatory Visit: Payer: Self-pay

## 2015-08-06 DIAGNOSIS — N182 Chronic kidney disease, stage 2 (mild): Principal | ICD-10-CM

## 2015-08-06 DIAGNOSIS — E1122 Type 2 diabetes mellitus with diabetic chronic kidney disease: Secondary | ICD-10-CM

## 2015-08-06 DIAGNOSIS — Z794 Long term (current) use of insulin: Principal | ICD-10-CM

## 2015-08-06 MED ORDER — METFORMIN HCL 1000 MG PO TABS: ORAL_TABLET | ORAL | Status: DC

## 2015-09-23 ENCOUNTER — Ambulatory Visit (AMBULATORY_SURGERY_CENTER): Payer: Self-pay | Admitting: *Deleted

## 2015-09-23 VITALS — Ht 74.0 in | Wt 240.0 lb

## 2015-09-23 DIAGNOSIS — Z1211 Encounter for screening for malignant neoplasm of colon: Secondary | ICD-10-CM

## 2015-09-23 MED ORDER — NA SULFATE-K SULFATE-MG SULF 17.5-3.13-1.6 GM/177ML PO SOLN: ORAL | 0 refills | Status: DC

## 2015-09-23 NOTE — Progress Notes (Signed)
No egg or soy allergy  No anesthesia or intubation problems per pt  No diet medications taken  No home oxygen used   

## 2015-10-04 ENCOUNTER — Encounter: Payer: Self-pay | Admitting: Gastroenterology

## 2015-10-05 ENCOUNTER — Other Ambulatory Visit: Payer: Self-pay | Admitting: *Deleted

## 2015-10-05 ENCOUNTER — Other Ambulatory Visit: Payer: Self-pay | Admitting: Internal Medicine

## 2015-10-05 DIAGNOSIS — Z794 Long term (current) use of insulin: Principal | ICD-10-CM

## 2015-10-05 DIAGNOSIS — E1122 Type 2 diabetes mellitus with diabetic chronic kidney disease: Secondary | ICD-10-CM

## 2015-10-05 DIAGNOSIS — N181 Chronic kidney disease, stage 1: Principal | ICD-10-CM

## 2015-10-05 MED ORDER — INSULIN GLARGINE 100 UNIT/ML SOLOSTAR PEN: PEN_INJECTOR | SUBCUTANEOUS | 1 refills | Status: DC

## 2015-10-05 NOTE — Telephone Encounter (Signed)
Patient requested 

## 2015-10-07 ENCOUNTER — Encounter: Payer: Self-pay | Admitting: Gastroenterology

## 2015-10-07 ENCOUNTER — Ambulatory Visit (AMBULATORY_SURGERY_CENTER): Payer: BLUE CROSS/BLUE SHIELD | Admitting: Gastroenterology

## 2015-10-07 VITALS — BP 128/85 | HR 60 | Temp 96.8°F | Resp 16 | Ht 74.0 in | Wt 240.0 lb

## 2015-10-07 DIAGNOSIS — D128 Benign neoplasm of rectum: Secondary | ICD-10-CM

## 2015-10-07 DIAGNOSIS — D129 Benign neoplasm of anus and anal canal: Secondary | ICD-10-CM

## 2015-10-07 DIAGNOSIS — K621 Rectal polyp: Secondary | ICD-10-CM

## 2015-10-07 DIAGNOSIS — Z1211 Encounter for screening for malignant neoplasm of colon: Secondary | ICD-10-CM

## 2015-10-07 HISTORY — PX: COLONOSCOPY: SHX174

## 2015-10-07 MED ORDER — SODIUM CHLORIDE 0.9 % IV SOLN: 500.0000 mL | INTRAVENOUS | Status: DC

## 2015-10-07 NOTE — Progress Notes (Signed)
A/ox3 pleased with MAC, report to Penny RN 

## 2015-10-07 NOTE — Progress Notes (Signed)
Called to room to assist during endoscopic procedure.  Patient ID and intended procedure confirmed with present staff. Received instructions for my participation in the procedure from the performing physician.  

## 2015-10-07 NOTE — Op Note (Signed)
[place] Patient Name: Mitchell Gibbs Procedure Date: 10/07/2015 8:01 AM MRN: [id number] Endoscopist: Mallie Mussel L. Loletha Carrow , MD Age: 65 Referring MD:  Date of Birth: 07-18-1950 Gender: Male Account #: [id number] Procedure:                Colonoscopy Indications:              Screening for colorectal malignant neoplasm, This                            is the patient's first colonoscopy Medicines:                Monitored Anesthesia Care Procedure:                Pre-Anesthesia Assessment:                           - Prior to the procedure, a History and Physical                            was performed, and patient medications and                            allergies were reviewed. The patient's tolerance of                            previous anesthesia was also reviewed. The risks                            and benefits of the procedure and the sedation                            options and risks were discussed with the patient.                            All questions were answered, and informed consent                            was obtained. Prior Anticoagulants: The patient has                            taken no previous anticoagulant or antiplatelet                            agents. ASA Grade Assessment: II - A patient with                            mild systemic disease. After reviewing the risks                            and benefits, the patient was deemed in                            satisfactory condition to undergo the procedure.  After obtaining informed consent, the colonoscope                            was passed under direct vision. Throughout the                            procedure, the patient's blood pressure, pulse, and                            oxygen saturations were monitored continuously. The                            Model CF-HQ190L [id number]) scope was introduced                            through the anus and  advanced to the the cecum,                            identified by appendiceal orifice and ileocecal                            valve. The colonoscopy was performed without                            difficulty. The patient tolerated the procedure                            well. The quality of the bowel preparation was                            excellent. The ileocecal valve, appendiceal                            orifice, and rectum were photographed. The quality                            of the bowel preparation was evaluated using the                            BBPS [facility] Bowel Preparation Scale) with scores                            of: Right Colon = 3, Transverse Colon = 3 and Left                            Colon = 3 (entire mucosa seen well with no residual                            staining, small fragments of stool or opaque                            liquid). The total BBPS score equals 9. The bowel  preparation used was SUPREP. Scope In: 8:07:59 AM Scope Out: 8:22:40 AM Scope Withdrawal Time: 0 hours 10 minutes 26 seconds  Total Procedure Duration: 0 hours 14 minutes 41 seconds  Findings:                 The perianal exam findings include large,                            non-thrombosed external hemorrhoids.                           A 2 mm polyp was found in the rectum. The polyp was                            sessile. The polyp was removed with a piecemeal                            technique using a cold biopsy forceps. Resection                            and retrieval were complete.                           Internal hemorrhoids were found during                            retroflexion. The hemorrhoids were large and Grade                            II (internal hemorrhoids that prolapse but reduce                            spontaneously).                           The exam was otherwise without abnormality. Complications:            No  immediate complications. Estimated Blood Loss:     Estimated blood loss: none. Impression:               - Non-thrombosed external hemorrhoids found on                            perianal exam.                           - One 2 mm polyp in the rectum, removed piecemeal                            using a cold biopsy forceps. Resected and retrieved.                           - Internal hemorrhoids.                           - The examination was otherwise normal. Recommendation:           -  Patient has a contact number available for                            emergencies. The signs and symptoms of potential                            delayed complications were discussed with the                            patient. Return to normal activities tomorrow.                            Written discharge instructions were provided to the                            patient.                           - Resume previous diet.                           - Continue present medications.                           - Await pathology results.                           - Repeat colonoscopy is recommended for                            surveillance. The colonoscopy date will be                            determined after pathology results from today's                            exam become available for review. Henry L. Loletha Carrow, MD 10/07/2015 8:26:57 AM This report has been signed electronically.

## 2015-10-07 NOTE — Patient Instructions (Signed)
YOU HAD AN ENDOSCOPIC PROCEDURE TODAY AT THE [facility]:   Refer to the procedure report that was given to you for any specific questions about what was found during the examination.  If the procedure report does not answer your questions, please call your gastroenterologist to clarify.  If you requested that your care partner not be given the details of your procedure findings, then the procedure report has been included in a sealed envelope for you to review at your convenience later.  YOU SHOULD EXPECT: Some feelings of bloating in the abdomen. Passage of more gas than usual.  Walking can help get rid of the air that was put into your GI tract during the procedure and reduce the bloating. If you had a lower endoscopy (such as a colonoscopy or flexible sigmoidoscopy) you may notice spotting of blood in your stool or on the toilet paper. If you underwent a bowel prep for your procedure, you may not have a normal bowel movement for a few days.  Please Note:  You might notice some irritation and congestion in your nose or some drainage.  This is from the oxygen used during your procedure.  There is no need for concern and it should clear up in a day or so.  SYMPTOMS TO REPORT IMMEDIATELY:   Following lower endoscopy (colonoscopy or flexible sigmoidoscopy):  Excessive amounts of blood in the stool  Significant tenderness or worsening of abdominal pains  Swelling of the abdomen that is new, acute  Fever of 100F or higher    For urgent or emergent issues, a gastroenterologist can be reached at any hour by calling [phone].   DIET:  We do recommend a small meal at first, but then you may proceed to your regular diet.  Drink plenty of fluids but you should avoid alcoholic beverages for 24 hours.  ACTIVITY:  You should plan to take it easy for the rest of today and you should NOT DRIVE or use heavy machinery until tomorrow (because of the sedation medicines used during the test).     FOLLOW UP: Our staff will call the number listed on your records the next business day following your procedure to check on you and address any questions or concerns that you may have regarding the information given to you following your procedure. If we do not reach you, we will leave a message.  However, if you are feeling well and you are not experiencing any problems, there is no need to return our call.  We will assume that you have returned to your regular daily activities without incident.  If any biopsies were taken you will be contacted by phone or by letter within the next 1-3 weeks.  Please call us at [phone] if you have not heard about the biopsies in 3 weeks.    SIGNATURES/CONFIDENTIALITY: You and/or your care partner have signed paperwork which will be entered into your electronic medical record.  These signatures attest to the fact that that the information above on your After Visit Summary has been reviewed and is understood.  Full responsibility of the confidentiality of this discharge information lies with you and/or your care-partner.    Information on polyps & hemorrhoids  given to you today 

## 2015-10-08 ENCOUNTER — Telehealth: Payer: Self-pay | Admitting: *Deleted

## 2015-10-08 ENCOUNTER — Telehealth: Payer: Self-pay

## 2015-10-08 MED ORDER — INSULIN DETEMIR 100 UNIT/ML FLEXPEN: PEN_INJECTOR | SUBCUTANEOUS | 1 refills | Status: DC

## 2015-10-08 NOTE — Telephone Encounter (Signed)
  Follow up Call-  Call back number 10/07/2015  Post procedure Call Back phone  # [phone]  Permission to leave phone message Yes  Some recent data might be hidden    Patient was called for a second time after his procedure on 10/07/2015. No answer at either number that was given for follow up phone call. A message was left on the answering machine.

## 2015-10-08 NOTE — Telephone Encounter (Signed)
Patient notified and medication faxed to pharmacy.

## 2015-10-08 NOTE — Telephone Encounter (Signed)
Patient called and stated that his Lantus Insulin is no longer covered by his insurance. Patient wants it changed to [place] that is covered. Please advise with direction.

## 2015-10-08 NOTE — Telephone Encounter (Signed)
ok to change to levemir #qs 30 days; inject 20 units subcut qhs  With 4RF

## 2015-10-08 NOTE — Telephone Encounter (Signed)
  Follow up Call-  Call back number 10/07/2015  Post procedure Call Back phone  # [phone]  Permission to leave phone message Yes  Some recent data might be hidden    Patient was called for follow up after his procedure on 10/07/2015. No answer at the number given for follow up phone call. A message was left on his answering machine.

## 2015-10-08 NOTE — Telephone Encounter (Signed)
  Follow up Call-  Call back number 10/07/2015  Post procedure Call Back phone  # [phone]  Permission to leave phone message Yes  Some recent data might be hidden     Patient questions:  Do you have a fever, pain , or abdominal swelling? No. Pain Score  0 *  Have you tolerated food without any problems? Yes.    Have you been able to return to your normal activities? Yes.    Do you have any questions about your discharge instructions: Diet   No. Medications  No. Follow up visit  No.  Do you have questions or concerns about your Care? No.  Actions: * If pain score is 4 or above: No action needed, pain <4.

## 2015-10-10 ENCOUNTER — Other Ambulatory Visit: Payer: Self-pay | Admitting: Internal Medicine

## 2015-10-10 DIAGNOSIS — R609 Edema, unspecified: Secondary | ICD-10-CM

## 2015-10-15 ENCOUNTER — Encounter: Payer: Self-pay | Admitting: Gastroenterology

## 2015-11-24 ENCOUNTER — Other Ambulatory Visit: Payer: Self-pay | Admitting: Internal Medicine

## 2015-11-26 ENCOUNTER — Other Ambulatory Visit: Payer: BLUE CROSS/BLUE SHIELD

## 2015-11-26 DIAGNOSIS — E1122 Type 2 diabetes mellitus with diabetic chronic kidney disease: Secondary | ICD-10-CM | POA: Diagnosis not present

## 2015-11-26 DIAGNOSIS — E785 Hyperlipidemia, unspecified: Secondary | ICD-10-CM

## 2015-11-26 DIAGNOSIS — C73 Malignant neoplasm of thyroid gland: Secondary | ICD-10-CM

## 2015-11-26 DIAGNOSIS — N182 Chronic kidney disease, stage 2 (mild): Principal | ICD-10-CM

## 2015-11-26 DIAGNOSIS — Z794 Long term (current) use of insulin: Principal | ICD-10-CM

## 2015-11-26 LAB — LIPID PANEL
CHOL/HDL RATIO: 2.9 ratio (ref <5.0)
CHOLESTEROL: 170 mg/dL (ref <200)
HDL: 59 mg/dL (ref >40)
LDL Cholesterol: 97 mg/dL (ref <100)
TRIGLYCERIDES: 69 mg/dL (ref <150)
VLDL: 14 mg/dL (ref <30)

## 2015-11-26 LAB — TSH: TSH: 3.05 mIU/L (ref 0.40–4.50)

## 2015-11-27 LAB — HEMOGLOBIN A1C
Hgb A1c MFr Bld: 8.4 % — ABNORMAL HIGH (ref <5.7)
Mean Plasma Glucose: 194 mg/dL

## 2015-11-29 ENCOUNTER — Other Ambulatory Visit: Payer: BLUE CROSS/BLUE SHIELD

## 2015-12-01 ENCOUNTER — Encounter: Payer: Self-pay | Admitting: Internal Medicine

## 2015-12-01 ENCOUNTER — Other Ambulatory Visit: Payer: Self-pay | Admitting: Internal Medicine

## 2015-12-01 ENCOUNTER — Ambulatory Visit (INDEPENDENT_AMBULATORY_CARE_PROVIDER_SITE_OTHER): Payer: BLUE CROSS/BLUE SHIELD | Admitting: Internal Medicine

## 2015-12-01 VITALS — BP 114/82 | HR 63 | Temp 98.0°F | Ht 74.0 in | Wt 240.0 lb

## 2015-12-01 DIAGNOSIS — R109 Unspecified abdominal pain: Secondary | ICD-10-CM

## 2015-12-01 DIAGNOSIS — N182 Chronic kidney disease, stage 2 (mild): Secondary | ICD-10-CM | POA: Diagnosis not present

## 2015-12-01 DIAGNOSIS — R609 Edema, unspecified: Secondary | ICD-10-CM | POA: Diagnosis not present

## 2015-12-01 DIAGNOSIS — C73 Malignant neoplasm of thyroid gland: Secondary | ICD-10-CM

## 2015-12-01 DIAGNOSIS — E785 Hyperlipidemia, unspecified: Secondary | ICD-10-CM | POA: Diagnosis not present

## 2015-12-01 DIAGNOSIS — Z794 Long term (current) use of insulin: Secondary | ICD-10-CM | POA: Diagnosis not present

## 2015-12-01 DIAGNOSIS — I10 Essential (primary) hypertension: Secondary | ICD-10-CM | POA: Diagnosis not present

## 2015-12-01 DIAGNOSIS — E1122 Type 2 diabetes mellitus with diabetic chronic kidney disease: Secondary | ICD-10-CM | POA: Diagnosis not present

## 2015-12-01 HISTORY — DX: Chronic kidney disease, stage 2 (mild): N18.2

## 2015-12-01 HISTORY — DX: Type 2 diabetes mellitus with diabetic chronic kidney disease: E11.22

## 2015-12-01 NOTE — Progress Notes (Signed)
Facility  [facility]    Place of Service:   OFFICE    No Known Allergies  Chief Complaint  Patient presents with  . Medical Management of Chronic Issues    4 month medication management blood sugar, blood pressure, edema, cholesterol, CKD review labs    HPI:  Type 2 diabetes mellitus with stage 2 chronic kidney disease, with long-term current use of insulin (HCC)  - A1c is much higher. Noncompliant with diet. Not gaining weight, but eating ice cream nearly every night. Eating more carbs because his grandson is home and his wife is feeding him more mac&cheese, bread, spaghetti, etc.  Essential hypertension - controlled  Edema, unspecified type - mild in the left leg  Hyperlipidemia, unspecified hyperlipidemia type - controlled  Malignant neoplasm of thyroid gland ([place]) - in remission  CKD stage 2 due to type 2 diabetes mellitus ([place]) - stable  Flank pain - started abruptly yesterday. Had been working on a ladder cleaning leaves out of the gutter. Pain is moderate. No fever, nausea, change in stools, or changed in urination. Denies dysuria or hematuria.     Medications: Patient's Medications  New Prescriptions   No medications on file  Previous Medications   ASPIRIN EC 81 MG TABLET    Take 81 mg by mouth daily.   BLOOD GLUCOSE MONITORING SUPPL (ONETOUCH VERIO FLEX SYSTEM) W/DEVICE KIT    1 kit by Does not apply route daily.   CIALIS 20 MG TABLET    TAKE 1/2 TO 1 TABLET BY MOUTH PRIOR TO INTERCOURSE TO HELP WITH ERECTILE DYSFUNCTION   FUROSEMIDE (LASIX) 20 MG TABLET    TAKE 1 TABLET BY MOUTH EVERY DAY AS NEEDED TO CONTROL EDEMA   GUAIFENESIN (ROBITUSSIN) 100 MG/5ML LIQUID    Take 200 mg by mouth 3 (three) times daily as needed for cough.   INSULIN DETEMIR (LEVEMIR) 100 UNIT/ML PEN    Inject 20 units subcutaneous once daily   INSULIN PEN NEEDLE 31G X 6 MM MISC    Use for insulin injection   LEVOTHYROXINE (SYNTHROID, LEVOTHROID) 125 MCG TABLET    Take 125 mcg by mouth daily.   LOSARTAN-HYDROCHLOROTHIAZIDE (HYZAAR) 100-25 MG TABLET    TAKE 1 TABLET BY MOUTH EVERY DAY TO CONTROL BLOOD PRSSURE   LOVASTATIN (MEVACOR) 40 MG TABLET    TAKE 1 TABLET DAILY TO CONTROL CHOLESTEROL   METFORMIN (GLUCOPHAGE) 1000 MG TABLET    Take one tablet by mouth before breakfast and one before supper to control blood sugar   ONE TOUCH ULTRA TEST TEST STRIP    TEST 2 TIMES DAILY   TERBINAFINE (LAMISIL AT) 1 % CREAM    Apply daily to rash on foot  Modified Medications   No medications on file  Discontinued Medications   No medications on file    Review of Systems  Constitutional: Negative.  Negative for activity change, appetite change, fatigue, fever and unexpected weight change.  HENT: Negative.  Negative for congestion, ear pain, hearing loss, rhinorrhea, sore throat, tinnitus, trouble swallowing and voice change.   Eyes: Negative.        Corrective lenses  Respiratory: Negative.  Negative for cough, choking, chest tightness, shortness of breath and wheezing.   Cardiovascular: Positive for leg swelling (mainly the left leg). Negative for chest pain and palpitations.  Gastrointestinal: Negative for abdominal distention, abdominal pain, anal bleeding, constipation, diarrhea and nausea.  Endocrine: Negative for cold intolerance, heat intolerance, polydipsia, polyphagia and polyuria.  Hx thyroid malignancy and of DM.  Genitourinary: Negative.  Negative for dysuria, frequency, testicular pain and urgency.       Not incontinent  Musculoskeletal: Negative for arthralgias, back pain, gait problem, joint swelling, myalgias and neck pain.       Pain in the right flank.  Skin: Negative for color change, pallor and rash.       Residual tenderness at the left index finger at site of previous laceration.  Allergic/Immunologic: Negative.   Neurological: Negative for dizziness, tremors, syncope, speech difficulty, weakness, numbness and headaches.  Hematological: Negative for adenopathy. Does  not bruise/bleed easily.  Psychiatric/Behavioral: Negative for behavioral problems, confusion, decreased concentration, hallucinations and sleep disturbance. The patient is not nervous/anxious.     Vitals:   12/01/15 1540  BP: 114/82  Pulse: 63  Temp: 98 F (36.7 C)  TempSrc: Oral  SpO2: 98%  Weight: 240 lb (108.9 kg)  Height: _0  (1.88 m)   Body mass index is 30.81 kg/m. Wt Readings from Last 3 Encounters:  12/01/15 240 lb (108.9 kg)  10/07/15 240 lb (108.9 kg)  09/23/15 240 lb (108.9 kg)      Physical Exam  Constitutional: He is oriented to person, place, and time. He appears well-developed. No distress.  overweight  HENT:  Right Ear: External ear normal.  Left Ear: External ear normal.  Nose: Nose normal.  Bilateral cerumen occlusion  Eyes: Conjunctivae and EOM are normal. Pupils are equal, round, and reactive to light.  Neck: Normal range of motion. Neck supple. No JVD present. No tracheal deviation present. No thyromegaly present.  Cardiovascular: Normal rate, regular rhythm, normal heart sounds and intact distal pulses.  Exam reveals no gallop and no friction rub.   No murmur heard. DP and PT pulses normal bilaterally  Pulmonary/Chest: No respiratory distress. He has no wheezes. He has no rales. He exhibits no tenderness.  Abdominal: He exhibits no distension and no mass. There is no tenderness.  Musculoskeletal: He exhibits edema (Left leg trace edema.).  No (pain elicited with percussion on the spine or oveer the kidneys  Lymphadenopathy:    He has no cervical adenopathy.  Neurological: He is alert and oriented to person, place, and time. He has normal reflexes. No cranial nerve deficit.  Sensation to vibration and monofilament bilaterally  Skin: No rash noted. No erythema. No pallor.  Psychiatric: He has a normal mood and affect. His behavior is normal. Judgment and thought content normal.    Labs reviewed: Lab Summary Latest Ref Rng & Units 11/26/2015  07/26/2015 05/03/2015 12/25/2014 09/07/2014  Hemoglobin 13.0-17.0 g/dL (None) (None) (None) (None) (None)  Hematocrit 39.0-52.0 % (None) (None) (None) (None) (None)  White count - (None) (None) (None) (None) (None)  Platelet count - (None) (None) (None) (None) (None)  Sodium 135 - 146 mmol/L (None) 138 142 140 140  Potassium 3.5 - 5.3 mmol/L (None) 4.0 4.0 3.7 4.0  Calcium 8.6 - 10.3 mg/dL (None) 9.4 9.8 9.8 9.4  Phosphorus - (None) (None) (None) (None) (None)  Creatinine 0.70 - 1.25 mg/dL (None) 1.28(H) 1.27 1.44(H) 1.38(H)  AST 0 - 40 IU/L (None) (None) 26 (None) (None)  Alk Phos 39 - 117 IU/L (None) (None) 58 (None) (None)  Bilirubin 0.0 - 1.2 mg/dL (None) (None) 0.4 (None) (None)  Glucose 65 - 99 mg/dL (None) 88 102(H) 75 94  Cholesterol <200 mg/dL 170 (None) (None) (None) (None)  HDL cholesterol >40 mg/dL 59 (None) (None) 70 64  Triglycerides <150 mg/dL 69 (None) (None)  55 60  LDL Direct - (None) (None) (None) (None) (None)  LDL Calc <100 mg/dL 97 (None) (None) 95 69  Total protein - (None) (None) (None) (None) (None)  Albumin 3.6 - 4.8 g/dL (None) (None) 4.3 (None) (None)  Some recent data might be hidden   Lab Results  Component Value Date   TSH 3.05 11/26/2015   TSH 1.12 07/26/2015   TSH 1.030 05/03/2015   Lab Results  Component Value Date   BUN 16 07/26/2015   BUN 18 05/03/2015   BUN 17 12/25/2014   Lab Results  Component Value Date   HGBA1C 8.4 (H) 11/26/2015   HGBA1C 7.2 (H) 07/26/2015   HGBA1C 7.9 (H) 05/03/2015    Assessment/Plan  1. Type 2 diabetes mellitus with stage 2 chronic kidney disease, with long-term current use of insulin ([place]) Strongly encouraged dietary compliance - Comprehensive metabolic panel; Future - Hemoglobin A1c; Future - Microalbumin, urine; Future  2. Essential hypertension - Comprehensive metabolic panel; Future  3. Edema, unspecified type unchanged  4. Hyperlipidemia, unspecified hyperlipidemia type - Lipid panel; Future  5.  Malignant neoplasm of thyroid gland (HCC) In remission - TSH; Future  6. CKD stage 2 due to type 2 diabetes mellitus ([place]) stable  7. Flank pain Likely musculoskeletal pains related to gutter cleaning.

## 2016-02-11 ENCOUNTER — Other Ambulatory Visit: Payer: Self-pay | Admitting: Internal Medicine

## 2016-02-11 DIAGNOSIS — Z794 Long term (current) use of insulin: Principal | ICD-10-CM

## 2016-02-11 DIAGNOSIS — N182 Chronic kidney disease, stage 2 (mild): Principal | ICD-10-CM

## 2016-02-11 DIAGNOSIS — E1122 Type 2 diabetes mellitus with diabetic chronic kidney disease: Secondary | ICD-10-CM

## 2016-02-11 MED ORDER — INSULIN PEN NEEDLE 31G X 8 MM MISC: 6 refills | Status: DC

## 2016-02-20 ENCOUNTER — Other Ambulatory Visit: Payer: Self-pay | Admitting: Internal Medicine

## 2016-02-20 DIAGNOSIS — N182 Chronic kidney disease, stage 2 (mild): Principal | ICD-10-CM

## 2016-02-20 DIAGNOSIS — Z794 Long term (current) use of insulin: Principal | ICD-10-CM

## 2016-02-20 DIAGNOSIS — E1122 Type 2 diabetes mellitus with diabetic chronic kidney disease: Secondary | ICD-10-CM

## 2016-02-23 LAB — HM DIABETES EYE EXAM

## 2016-02-24 ENCOUNTER — Encounter: Payer: Self-pay | Admitting: Internal Medicine

## 2016-03-10 ENCOUNTER — Other Ambulatory Visit: Payer: Self-pay | Admitting: Internal Medicine

## 2016-03-21 DIAGNOSIS — C73 Malignant neoplasm of thyroid gland: Secondary | ICD-10-CM | POA: Diagnosis not present

## 2016-03-21 DIAGNOSIS — E890 Postprocedural hypothyroidism: Secondary | ICD-10-CM | POA: Diagnosis not present

## 2016-03-28 ENCOUNTER — Other Ambulatory Visit: Payer: BLUE CROSS/BLUE SHIELD

## 2016-03-28 DIAGNOSIS — E785 Hyperlipidemia, unspecified: Secondary | ICD-10-CM

## 2016-03-28 DIAGNOSIS — E1122 Type 2 diabetes mellitus with diabetic chronic kidney disease: Secondary | ICD-10-CM | POA: Diagnosis not present

## 2016-03-28 DIAGNOSIS — E890 Postprocedural hypothyroidism: Secondary | ICD-10-CM | POA: Diagnosis not present

## 2016-03-28 DIAGNOSIS — C73 Malignant neoplasm of thyroid gland: Secondary | ICD-10-CM | POA: Diagnosis not present

## 2016-03-28 DIAGNOSIS — N182 Chronic kidney disease, stage 2 (mild): Secondary | ICD-10-CM

## 2016-03-28 DIAGNOSIS — I10 Essential (primary) hypertension: Secondary | ICD-10-CM | POA: Diagnosis not present

## 2016-03-28 DIAGNOSIS — Z794 Long term (current) use of insulin: Secondary | ICD-10-CM

## 2016-03-28 LAB — LIPID PANEL
CHOLESTEROL: 165 mg/dL (ref <200)
HDL: 58 mg/dL (ref >40)
LDL CALC: 89 mg/dL (ref <100)
TRIGLYCERIDES: 89 mg/dL (ref <150)
Total CHOL/HDL Ratio: 2.8 Ratio (ref <5.0)
VLDL: 18 mg/dL (ref <30)

## 2016-03-28 LAB — COMPREHENSIVE METABOLIC PANEL
ALBUMIN: 4 g/dL (ref 3.6–5.1)
ALT: 18 U/L (ref 9–46)
AST: 22 U/L (ref 10–35)
Alkaline Phosphatase: 64 U/L (ref 40–115)
BILIRUBIN TOTAL: 0.3 mg/dL (ref 0.2–1.2)
BUN: 23 mg/dL (ref 7–25)
CALCIUM: 9.5 mg/dL (ref 8.6–10.3)
CO2: 27 mmol/L (ref 20–31)
Chloride: 99 mmol/L (ref 98–110)
Creat: 1.43 mg/dL — ABNORMAL HIGH (ref 0.70–1.25)
Glucose, Bld: 155 mg/dL — ABNORMAL HIGH (ref 65–99)
Potassium: 4 mmol/L (ref 3.5–5.3)
Sodium: 138 mmol/L (ref 135–146)
TOTAL PROTEIN: 7.2 g/dL (ref 6.1–8.1)

## 2016-03-28 LAB — TSH: TSH: 0.91 mIU/L (ref 0.40–4.50)

## 2016-03-29 LAB — HEMOGLOBIN A1C
Hgb A1c MFr Bld: 8.3 % — ABNORMAL HIGH (ref <5.7)
Mean Plasma Glucose: 192 mg/dL

## 2016-03-29 LAB — MICROALBUMIN, URINE: Microalb, Ur: 0.8 mg/dL

## 2016-03-30 ENCOUNTER — Other Ambulatory Visit: Payer: BLUE CROSS/BLUE SHIELD

## 2016-04-04 ENCOUNTER — Ambulatory Visit (INDEPENDENT_AMBULATORY_CARE_PROVIDER_SITE_OTHER): Payer: BLUE CROSS/BLUE SHIELD | Admitting: Internal Medicine

## 2016-04-04 ENCOUNTER — Encounter: Payer: Self-pay | Admitting: Internal Medicine

## 2016-04-04 VITALS — BP 112/70 | HR 70 | Temp 98.2°F | Resp 18 | Ht 75.0 in | Wt 241.3 lb

## 2016-04-04 DIAGNOSIS — Z6830 Body mass index (BMI) 30.0-30.9, adult: Secondary | ICD-10-CM

## 2016-04-04 DIAGNOSIS — E6609 Other obesity due to excess calories: Secondary | ICD-10-CM

## 2016-04-04 DIAGNOSIS — I10 Essential (primary) hypertension: Secondary | ICD-10-CM | POA: Diagnosis not present

## 2016-04-04 DIAGNOSIS — E1122 Type 2 diabetes mellitus with diabetic chronic kidney disease: Secondary | ICD-10-CM

## 2016-04-04 DIAGNOSIS — E785 Hyperlipidemia, unspecified: Secondary | ICD-10-CM | POA: Diagnosis not present

## 2016-04-04 DIAGNOSIS — R609 Edema, unspecified: Secondary | ICD-10-CM

## 2016-04-04 DIAGNOSIS — N182 Chronic kidney disease, stage 2 (mild): Secondary | ICD-10-CM

## 2016-04-04 DIAGNOSIS — Z794 Long term (current) use of insulin: Secondary | ICD-10-CM | POA: Diagnosis not present

## 2016-04-04 DIAGNOSIS — C73 Malignant neoplasm of thyroid gland: Secondary | ICD-10-CM

## 2016-04-04 DIAGNOSIS — E66811 Obesity, class 1: Secondary | ICD-10-CM

## 2016-04-04 MED ORDER — INSULIN DETEMIR 100 UNIT/ML FLEXPEN: PEN_INJECTOR | SUBCUTANEOUS | 1 refills | Status: DC

## 2016-04-04 NOTE — Progress Notes (Signed)
Facility  [facility]    Place of Service:   OFFICE    No Known Allergies  Chief Complaint  Patient presents with  . Medical Management of Chronic Issues    4 month follow-up on BS,BP, Cholesterol, Edema, thyroid, CKD. Review labs.   . Medication Refill    No refills needed at this time    HPI:  Type 2 diabetes mellitus with stage 2 chronic kidney disease, with long-term current use of insulin (HCC) - still not at best control. A1c 8.3. Has not been checking glucose at home. No symptoms of hypoglycemia.  Essential hypertension - controlled  Hyperlipidemia, unspecified hyperlipidemia type - controlled  Edema, unspecified type - resolved. Not using furosemide  Malignant neoplasm of thyroid gland (HCC) - no relapse. Normal TSH. Has been released from Dr. Chalmers Cater  Class 1 obesity due to excess calories without serious comorbidity with body mass index (BMI) of 30.0 to 30.9 in adult - continue to modufy diet and lose weight.    Medications: Patient's Medications  New Prescriptions   No medications on file  Previous Medications   ASPIRIN EC 81 MG TABLET    Take 81 mg by mouth daily.   BLOOD GLUCOSE MONITORING SUPPL (ONETOUCH VERIO FLEX SYSTEM) W/DEVICE KIT    1 kit by Does not apply route daily.   CIALIS 20 MG TABLET    TAKE 1/2 TO 1 TABLET BY MOUTH PRIOR TO INTERCOURSE TO HELP WITH ERECTILE DYSFUNCTION   FUROSEMIDE (LASIX) 20 MG TABLET    TAKE 1 TABLET BY MOUTH EVERY DAY AS NEEDED TO CONTROL EDEMA   GUAIFENESIN (ROBITUSSIN) 100 MG/5ML LIQUID    Take 200 mg by mouth 3 (three) times daily as needed for cough.   INSULIN DETEMIR (LEVEMIR) 100 UNIT/ML PEN    Inject 20 units subcutaneous once daily   INSULIN PEN NEEDLE 31G X 8 MM MISC    Use for insulin injection.   LEVOTHYROXINE (SYNTHROID, LEVOTHROID) 125 MCG TABLET    Take 125 mcg by mouth daily.   LOSARTAN-HYDROCHLOROTHIAZIDE (HYZAAR) 100-25 MG TABLET    TAKE 1 TABLET BY MOUTH EVERY DAY TO CONTROL BLOOD PRSSURE   LOVASTATIN (MEVACOR)  40 MG TABLET    TAKE 1 TABLET DAILY TO CONTROL CHOLESTEROL   METFORMIN (GLUCOPHAGE) 1000 MG TABLET    TAKE ONE TABLET BY MOUTH BEFORE BREAKFAST AND ONE BEFORE SUPPER TO CONTROL BLOOD SUGAR   ONE TOUCH ULTRA TEST TEST STRIP    TEST 2 TIMES DAILY   TERBINAFINE (LAMISIL AT) 1 % CREAM    Apply daily to rash on foot  Modified Medications   No medications on file  Discontinued Medications   No medications on file    Review of Systems  Constitutional: Negative.  Negative for activity change, appetite change, fatigue, fever and unexpected weight change.  HENT: Negative.  Negative for congestion, ear pain, hearing loss, rhinorrhea, sore throat, tinnitus, trouble swallowing and voice change.   Eyes: Negative.        Corrective lenses  Respiratory: Negative.  Negative for cough, choking, chest tightness, shortness of breath and wheezing.   Cardiovascular: Negative for chest pain, palpitations and leg swelling.  Gastrointestinal: Negative for abdominal distention, abdominal pain, anal bleeding, constipation, diarrhea and nausea.  Endocrine: Negative for cold intolerance, heat intolerance, polydipsia, polyphagia and polyuria.       Hx thyroid malignancy and of DM.  Genitourinary: Negative.  Negative for dysuria, frequency, testicular pain and urgency.  Not incontinent  Musculoskeletal: Negative for arthralgias, back pain, gait problem, joint swelling, myalgias and neck pain.  Skin: Negative for color change, pallor and rash.       Residual tenderness at the left index finger at site of previous laceration.  Allergic/Immunologic: Negative.   Neurological: Negative for dizziness, tremors, syncope, speech difficulty, weakness, numbness and headaches.  Hematological: Negative for adenopathy. Does not bruise/bleed easily.  Psychiatric/Behavioral: Negative for behavioral problems, confusion, decreased concentration, hallucinations and sleep disturbance. The patient is not nervous/anxious.      Vitals:   04/04/16 1541  BP: 112/70  Pulse: 70  Resp: 18  Temp: 98.2 F (36.8 C)  TempSrc: Oral  SpO2: 98%  Weight: 241 lb 4.8 oz (109.5 kg)  Height: '6\' 3"'$  (1.905 m)   Body mass index is 30.16 kg/m. Wt Readings from Last 3 Encounters:  04/04/16 241 lb 4.8 oz (109.5 kg)  12/01/15 240 lb (108.9 kg)  10/07/15 240 lb (108.9 kg)      Physical Exam  Constitutional: He is oriented to person, place, and time. He appears well-developed. No distress.  overweight  HENT:  Right Ear: External ear normal.  Left Ear: External ear normal.  Nose: Nose normal.  Bilateral cerumen occlusion  Eyes: Conjunctivae and EOM are normal. Pupils are equal, round, and reactive to light.  Neck: Normal range of motion. Neck supple. No JVD present. No tracheal deviation present. No thyromegaly present.  Cardiovascular: Normal rate, regular rhythm, normal heart sounds and intact distal pulses.  Exam reveals no gallop and no friction rub.   No murmur heard. DP and PT pulses normal bilaterally  Pulmonary/Chest: No respiratory distress. He has no wheezes. He has no rales. He exhibits no tenderness.  Abdominal: He exhibits no distension and no mass. There is no tenderness.  Musculoskeletal: He exhibits no edema.  No (pain elicited with percussion on the spine or oveer the kidneys  Lymphadenopathy:    He has no cervical adenopathy.  Neurological: He is alert and oriented to person, place, and time. He has normal reflexes. No cranial nerve deficit.  Sensation to vibration and monofilament bilaterally  Skin: No rash noted. No erythema. No pallor.  Psychiatric: He has a normal mood and affect. His behavior is normal. Judgment and thought content normal.    Labs reviewed: Lab Summary Latest Ref Rng & Units 03/28/2016 11/26/2015 07/26/2015 05/03/2015  Hemoglobin 13.0-17.0 g/dL (None) (None) (None) (None)  Hematocrit 39.0-52.0 % (None) (None) (None) (None)  White count - (None) (None) (None) (None)   Platelet count - (None) (None) (None) (None)  Sodium 135 - 146 mmol/L 138 (None) 138 142  Potassium 3.5 - 5.3 mmol/L 4.0 (None) 4.0 4.0  Calcium 8.6 - 10.3 mg/dL 9.5 (None) 9.4 9.8  Phosphorus - (None) (None) (None) (None)  Creatinine 0.70 - 1.25 mg/dL 1.43(H) (None) 1.28(H) 1.27  AST 10 - 35 U/L 22 (None) (None) 26  Alk Phos 40 - 115 U/L 64 (None) (None) 58  Bilirubin 0.2 - 1.2 mg/dL 0.3 (None) (None) 0.4  Glucose 65 - 99 mg/dL 155(H) (None) 88 102(H)  Cholesterol <200 mg/dL 165 170 (None) (None)  HDL cholesterol >40 mg/dL 58 59 (None) (None)  Triglycerides <150 mg/dL 89 69 (None) (None)  LDL Direct - (None) (None) (None) (None)  LDL Calc <100 mg/dL 89 97 (None) (None)  Total protein 6.1 - 8.1 g/dL 7.2 (None) (None) (None)  Albumin 3.6 - 5.1 g/dL 4.0 (None) (None) 4.3  Some recent data might be hidden  Lab Results  Component Value Date   TSH 0.91 03/28/2016   TSH 3.05 11/26/2015   TSH 1.12 07/26/2015   Lab Results  Component Value Date   BUN 23 03/28/2016   BUN 16 07/26/2015   BUN 18 05/03/2015   Lab Results  Component Value Date   HGBA1C 8.3 (H) 03/28/2016   HGBA1C 8.4 (H) 11/26/2015   HGBA1C 7.2 (H) 07/26/2015    Assessment/Plan 1. Type 2 diabetes mellitus with stage 2 chronic kidney disease, with long-term current use of insulin (HCC) Increase Levemir to 25 units daily - Hemoglobin A1c; Future - Basic metabolic panel; Future  2. Essential hypertension The current medical regimen is effective;  continue present plan and medications. - Basic metabolic panel; Future  3. Hyperlipidemia, unspecified hyperlipidemia type The current medical regimen is effective;  continue present plan and medications.  4. Edema, unspecified type resolved  5. Malignant neoplasm of thyroid gland (HCC) - TSH; Future  6. Class 1 obesity due to excess calories without serious comorbidity with body mass index (BMI) of 30.0 to 30.9 in adult Continue weight loss attempts.

## 2016-04-06 DIAGNOSIS — H47233 Glaucomatous optic atrophy, bilateral: Secondary | ICD-10-CM | POA: Diagnosis not present

## 2016-04-26 ENCOUNTER — Other Ambulatory Visit: Payer: Self-pay | Admitting: Internal Medicine

## 2016-04-26 DIAGNOSIS — H25813 Combined forms of age-related cataract, bilateral: Secondary | ICD-10-CM | POA: Diagnosis not present

## 2016-06-05 DIAGNOSIS — H25811 Combined forms of age-related cataract, right eye: Secondary | ICD-10-CM | POA: Diagnosis not present

## 2016-06-05 HISTORY — PX: CATARACT EXTRACTION W/ INTRAOCULAR LENS IMPLANT: SHX1309

## 2016-06-20 ENCOUNTER — Ambulatory Visit: Payer: BLUE CROSS/BLUE SHIELD | Admitting: Internal Medicine

## 2016-06-21 ENCOUNTER — Ambulatory Visit (INDEPENDENT_AMBULATORY_CARE_PROVIDER_SITE_OTHER): Payer: BLUE CROSS/BLUE SHIELD | Admitting: Internal Medicine

## 2016-06-21 ENCOUNTER — Encounter: Payer: Self-pay | Admitting: Internal Medicine

## 2016-06-21 VITALS — BP 130/82 | HR 63 | Temp 98.3°F | Ht 75.0 in | Wt 236.0 lb

## 2016-06-21 DIAGNOSIS — I10 Essential (primary) hypertension: Secondary | ICD-10-CM | POA: Diagnosis not present

## 2016-06-21 DIAGNOSIS — C73 Malignant neoplasm of thyroid gland: Secondary | ICD-10-CM | POA: Diagnosis not present

## 2016-06-21 DIAGNOSIS — E785 Hyperlipidemia, unspecified: Secondary | ICD-10-CM

## 2016-06-21 DIAGNOSIS — Z6830 Body mass index (BMI) 30.0-30.9, adult: Secondary | ICD-10-CM

## 2016-06-21 DIAGNOSIS — N529 Male erectile dysfunction, unspecified: Secondary | ICD-10-CM | POA: Diagnosis not present

## 2016-06-21 DIAGNOSIS — E6609 Other obesity due to excess calories: Secondary | ICD-10-CM | POA: Diagnosis not present

## 2016-06-21 DIAGNOSIS — N182 Chronic kidney disease, stage 2 (mild): Secondary | ICD-10-CM

## 2016-06-21 DIAGNOSIS — R609 Edema, unspecified: Secondary | ICD-10-CM

## 2016-06-21 DIAGNOSIS — E1122 Type 2 diabetes mellitus with diabetic chronic kidney disease: Secondary | ICD-10-CM

## 2016-06-21 DIAGNOSIS — Z794 Long term (current) use of insulin: Secondary | ICD-10-CM | POA: Diagnosis not present

## 2016-06-21 MED ORDER — SILDENAFIL CITRATE 100 MG PO TABS: 100.0000 mg | ORAL_TABLET | Freq: Every day | ORAL | 5 refills | Status: DC | PRN

## 2016-06-21 NOTE — Progress Notes (Signed)
Facility  [facility]    Place of Service:   OFFICE    No Known Allergies  Chief Complaint  Patient presents with  . Medical Management of Chronic Issues     3 month medication management blood sugar, cholesterol, blood pressure    HPI:  Type 2 diabetes mellitus with stage 2 chronic kidney disease, with long-term current use of insulin (HCC)  - no recent lab  Edema, unspecified type - resolved  Hyperlipidemia, unspecified hyperlipidemia type - no recent lab  Essential hypertension - controlled  Malignant neoplasm of thyroid gland ([place]) - resolved. He was released by his endocrinologist a couple of months ago.  Class 1 obesity due to excess calories without serious comorbidity with body mass index (BMI) of 30.0 to 30.9 in adult - haas lost 5# since his last visit. Has cut back on portion size and is drinking Boost for breakfast.   CKD stage 2 due to type 2 diabetes mellitus ([place]) - no recent lab  Erectile dysfunction, unspecified erectile dysfunction type - has encountered problem getting his Cialis filled due to insurance coverage issues    Medications: Patient's Medications  New Prescriptions   No medications on file  Previous Medications   ASPIRIN EC 81 MG TABLET    Take 81 mg by mouth daily.   BLOOD GLUCOSE MONITORING SUPPL (ONETOUCH VERIO FLEX SYSTEM) W/DEVICE KIT    1 kit by Does not apply route daily.   CIALIS 20 MG TABLET    TAKE 1/2 TO 1 TABLET BY MOUTH PRIOR TO INTERCOURSE TO HELP WITH ERECTILE DYSFUNCTION   FUROSEMIDE (LASIX) 20 MG TABLET    TAKE 1 TABLET BY MOUTH EVERY DAY AS NEEDED TO CONTROL EDEMA   GUAIFENESIN (ROBITUSSIN) 100 MG/5ML LIQUID    Take 200 mg by mouth 3 (three) times daily as needed for cough.   INSULIN DETEMIR (LEVEMIR) 100 UNIT/ML PEN    Inject 25 units subcutaneous once daily   INSULIN PEN NEEDLE 31G X 8 MM MISC    Use for insulin injection.   LEVOTHYROXINE (SYNTHROID, LEVOTHROID) 125 MCG TABLET    Take 125 mcg by mouth daily.   LOSARTAN-HYDROCHLOROTHIAZIDE (HYZAAR) 100-25 MG TABLET    TAKE 1 TABLET BY MOUTH EVERY DAY TO CONTROL BLOOD PRSSURE   LOVASTATIN (MEVACOR) 40 MG TABLET    TAKE 1 TABLET DAILY TO CONTROL CHOLESTEROL   METFORMIN (GLUCOPHAGE) 1000 MG TABLET    TAKE ONE TABLET BY MOUTH BEFORE BREAKFAST AND ONE BEFORE SUPPER TO CONTROL BLOOD SUGAR   ONE TOUCH ULTRA TEST TEST STRIP    TEST 2 TIMES DAILY   PREDNISOLONE ACETATE (PRED FORTE) 1 % OPHTHALMIC SUSPENSION    One drop in right eye, taper down from 4 times daily to once daily   PROLENSA 0.07 % SOLN    One drop in right eye daily   TERBINAFINE (LAMISIL AT) 1 % CREAM    Apply daily to rash on foot  Modified Medications   No medications on file  Discontinued Medications   No medications on file    Review of Systems  Constitutional: Negative for activity change, appetite change, fatigue, fever and unexpected weight change.  HENT: Negative for congestion, ear pain, hearing loss, rhinorrhea, sore throat, tinnitus, trouble swallowing and voice change.   Eyes:       Corrective lenses  Respiratory: Negative for cough, choking, chest tightness, shortness of breath and wheezing.   Cardiovascular: Negative for chest pain, palpitations and leg swelling.  Gastrointestinal: Negative  for abdominal distention, abdominal pain, anal bleeding, constipation, diarrhea and nausea.  Endocrine: Negative for cold intolerance, heat intolerance, polydipsia, polyphagia and polyuria.       Hx thyroid malignancy and of DM.  Genitourinary: Negative for dysuria, frequency, testicular pain and urgency.       Not incontinent. Has erectile dysfunction with difficulty getting and maintaining erection. Has benefited from use of Cialis.  Musculoskeletal: Negative for arthralgias, back pain, gait problem, joint swelling, myalgias and neck pain.  Skin: Negative for color change, pallor and rash.       Residual tenderness at the left index finger at site of previous laceration.    Allergic/Immunologic: Negative.   Neurological: Negative for dizziness, tremors, syncope, speech difficulty, weakness, numbness and headaches.  Hematological: Negative for adenopathy. Does not bruise/bleed easily.  Psychiatric/Behavioral: Negative for behavioral problems, confusion, decreased concentration, hallucinations and sleep disturbance. The patient is not nervous/anxious.     Vitals:   06/21/16 1626  BP: 130/82  Pulse: 63  Temp: 98.3 F (36.8 C)  TempSrc: Oral  SpO2: 98%  Weight: 236 lb (107 kg)  Height: 6' 3" (1.905 m)   Body mass index is 29.5 kg/m. Wt Readings from Last 3 Encounters:  06/21/16 236 lb (107 kg)  04/04/16 241 lb 4.8 oz (109.5 kg)  12/01/15 240 lb (108.9 kg)      Physical Exam  Constitutional: He is oriented to person, place, and time. He appears well-developed. No distress.  overweight  HENT:  Right Ear: External ear normal.  Left Ear: External ear normal.  Nose: Nose normal.  Bilateral cerumen occlusion  Eyes: Conjunctivae and EOM are normal. Pupils are equal, round, and reactive to light.  Neck: Normal range of motion. Neck supple. No JVD present. No tracheal deviation present. No thyromegaly present.  Cardiovascular: Normal rate, regular rhythm, normal heart sounds and intact distal pulses.  Exam reveals no gallop and no friction rub.   No murmur heard. DP and PT pulses normal bilaterally  Pulmonary/Chest: No respiratory distress. He has no wheezes. He has no rales. He exhibits no tenderness.  Abdominal: He exhibits no distension and no mass. There is no tenderness.  Musculoskeletal: He exhibits no edema.  Lymphadenopathy:    He has no cervical adenopathy.  Neurological: He is alert and oriented to person, place, and time. He has normal reflexes. No cranial nerve deficit.  Sensation to vibration and monofilament bilaterally  Skin: No rash noted. No erythema. No pallor.  Psychiatric: He has a normal mood and affect. His behavior is normal.  Judgment and thought content normal.    Labs reviewed: Lab Summary Latest Ref Rng & Units 03/28/2016 11/26/2015 07/26/2015 05/03/2015  Hemoglobin 13.0-17.0  (None) (None) (None) (None)  Hematocrit 39.0-52.0  (None) (None) (None) (None)  White count - (None) (None) (None) (None)  Platelet count - (None) (None) (None) (None)  Sodium 135 - 146 mmol/L 138 (None) 138 142  Potassium 3.5 - 5.3 mmol/L 4.0 (None) 4.0 4.0  Calcium 8.6 - 10.3 mg/dL 9.5 (None) 9.4 9.8  Phosphorus - (None) (None) (None) (None)  Creatinine 0.70 - 1.25 mg/dL 1.43(H) (None) 1.28(H) 1.27  AST 10 - 35 U/L 22 (None) (None) 26  Alk Phos 40 - 115 U/L 64 (None) (None) 58  Bilirubin 0.2 - 1.2 mg/dL 0.3 (None) (None) 0.4  Glucose 65 - 99 mg/dL 155(H) (None) 88 102(H)  Cholesterol <200 mg/dL 165 170 (None) (None)  HDL cholesterol >40 mg/dL 58 59 (None) (None)  Triglycerides <150 mg/dL 89  69 (None) (None)  LDL Direct - (None) (None) (None) (None)  LDL Calc <100 mg/dL 89 97 (None) (None)  Total protein 6.1 - 8.1 g/dL 7.2 (None) (None) (None)  Albumin 3.6 - 5.1 g/dL 4.0 (None) (None) 4.3  Some recent data might be hidden   Lab Results  Component Value Date   TSH 0.91 03/28/2016   TSH 3.05 11/26/2015   TSH 1.12 07/26/2015   Lab Results  Component Value Date   BUN 23 03/28/2016   BUN 16 07/26/2015   BUN 18 05/03/2015   Lab Results  Component Value Date   HGBA1C 8.3 (H) 03/28/2016   HGBA1C 8.4 (H) 11/26/2015   HGBA1C 7.2 (H) 07/26/2015    Assessment/Plan  1. Type 2 diabetes mellitus with stage 2 chronic kidney disease, with long-term current use of insulin ([facility] [facility]) Return for lab  2. Edema, unspecified type resolved  3. Hyperlipidemia, unspecified hyperlipidemia type Return for lab  4. Essential hypertension The current medical regimen is effective;  continue present plan and medications.  5. Malignant neoplasm of thyroid gland ([place]) resolved  6. Class 1 obesity due to excess calories without serious  comorbidity with body mass index (BMI) of 30.0 to 30.9 in adult Continue weight loss. His goal is 220# by Oct 2018.  7. CKD stage 2 due to type 2 diabetes mellitus ([place]) Return for lab  8. Erectile dysfunction, unspecified erectile dysfunction type Coupon given - sildenafil (VIAGRA) 100 MG tablet; Take 1 tablet (100 mg total) by mouth daily as needed for erectile dysfunction.  Dispense: 10 tablet; Refill: 5

## 2016-06-28 ENCOUNTER — Other Ambulatory Visit: Payer: BLUE CROSS/BLUE SHIELD

## 2016-06-28 DIAGNOSIS — Z794 Long term (current) use of insulin: Principal | ICD-10-CM

## 2016-06-28 DIAGNOSIS — C73 Malignant neoplasm of thyroid gland: Secondary | ICD-10-CM | POA: Diagnosis not present

## 2016-06-28 DIAGNOSIS — I10 Essential (primary) hypertension: Secondary | ICD-10-CM | POA: Diagnosis not present

## 2016-06-28 DIAGNOSIS — E1122 Type 2 diabetes mellitus with diabetic chronic kidney disease: Secondary | ICD-10-CM

## 2016-06-28 DIAGNOSIS — N182 Chronic kidney disease, stage 2 (mild): Secondary | ICD-10-CM | POA: Diagnosis not present

## 2016-06-28 LAB — TSH: TSH: 2.66 mIU/L (ref 0.40–4.50)

## 2016-06-28 LAB — BASIC METABOLIC PANEL
BUN: 20 mg/dL (ref 7–25)
CALCIUM: 9.8 mg/dL (ref 8.6–10.3)
CO2: 30 mmol/L (ref 20–31)
Chloride: 100 mmol/L (ref 98–110)
Creat: 1.39 mg/dL — ABNORMAL HIGH (ref 0.70–1.25)
Glucose, Bld: 156 mg/dL — ABNORMAL HIGH (ref 65–99)
Potassium: 3.8 mmol/L (ref 3.5–5.3)
SODIUM: 140 mmol/L (ref 135–146)

## 2016-06-29 LAB — HEMOGLOBIN A1C
Hgb A1c MFr Bld: 8.6 % — ABNORMAL HIGH (ref <5.7)
Mean Plasma Glucose: 200 mg/dL

## 2016-06-30 ENCOUNTER — Telehealth: Payer: Self-pay

## 2016-06-30 NOTE — Telephone Encounter (Signed)
Left message on voicemail with details of lab results (ok per DPR)  Medication list updated, copy of labs mailed.  Patient instructed to call if question or concerns

## 2016-06-30 NOTE — Telephone Encounter (Signed)
-----   Message from Estill Dooms, MD sent at 06/29/2016  9:28 AM EDT ----- Glucose still running high. Increase insulin to 30 units daily.

## 2016-07-13 DIAGNOSIS — H5213 Myopia, bilateral: Secondary | ICD-10-CM | POA: Diagnosis not present

## 2016-09-07 ENCOUNTER — Other Ambulatory Visit: Payer: Self-pay | Admitting: Internal Medicine

## 2016-09-07 ENCOUNTER — Telehealth: Payer: Self-pay

## 2016-09-07 MED ORDER — INSULIN DETEMIR 100 UNIT/ML FLEXPEN
30.0000 [IU] | PEN_INJECTOR | Freq: Every day | SUBCUTANEOUS | 1 refills | Status: DC
Start: 2016-09-07 — End: 2016-10-30

## 2016-09-07 NOTE — Telephone Encounter (Signed)
[facility] called to inquire about Levemir RX, RX was sent with duplicate instructions.  I clarified instructions, medication list updated and submitted new RX to [facility] [address]

## 2016-09-14 DIAGNOSIS — H43811 Vitreous degeneration, right eye: Secondary | ICD-10-CM | POA: Diagnosis not present

## 2016-10-24 ENCOUNTER — Ambulatory Visit (INDEPENDENT_AMBULATORY_CARE_PROVIDER_SITE_OTHER): Payer: BLUE CROSS/BLUE SHIELD | Admitting: Internal Medicine

## 2016-10-24 ENCOUNTER — Encounter: Payer: Self-pay | Admitting: Internal Medicine

## 2016-10-24 VITALS — BP 134/78 | HR 67 | Temp 98.5°F | Ht 72.0 in | Wt 234.0 lb

## 2016-10-24 DIAGNOSIS — I10 Essential (primary) hypertension: Secondary | ICD-10-CM

## 2016-10-24 DIAGNOSIS — N182 Chronic kidney disease, stage 2 (mild): Secondary | ICD-10-CM

## 2016-10-24 DIAGNOSIS — Z6830 Body mass index (BMI) 30.0-30.9, adult: Secondary | ICD-10-CM

## 2016-10-24 DIAGNOSIS — Z79899 Other long term (current) drug therapy: Secondary | ICD-10-CM | POA: Diagnosis not present

## 2016-10-24 DIAGNOSIS — Z794 Long term (current) use of insulin: Secondary | ICD-10-CM | POA: Diagnosis not present

## 2016-10-24 DIAGNOSIS — E890 Postprocedural hypothyroidism: Secondary | ICD-10-CM

## 2016-10-24 DIAGNOSIS — Z85850 Personal history of malignant neoplasm of thyroid: Secondary | ICD-10-CM

## 2016-10-24 DIAGNOSIS — E1122 Type 2 diabetes mellitus with diabetic chronic kidney disease: Secondary | ICD-10-CM

## 2016-10-24 DIAGNOSIS — R609 Edema, unspecified: Secondary | ICD-10-CM | POA: Diagnosis not present

## 2016-10-24 DIAGNOSIS — E6609 Other obesity due to excess calories: Secondary | ICD-10-CM | POA: Diagnosis not present

## 2016-10-24 DIAGNOSIS — E782 Mixed hyperlipidemia: Secondary | ICD-10-CM

## 2016-10-24 MED ORDER — LEVOTHYROXINE SODIUM 125 MCG PO TABS: 125.0000 ug | ORAL_TABLET | Freq: Every day | ORAL | 2 refills | Status: DC

## 2016-10-24 MED ORDER — ZOSTER VAC RECOMB ADJUVANTED 50 MCG/0.5ML IM SUSR: 0.5000 mL | Freq: Once | INTRAMUSCULAR | 1 refills | Status: AC

## 2016-10-24 MED ORDER — METFORMIN HCL 1000 MG PO TABS: ORAL_TABLET | ORAL | 2 refills | Status: DC

## 2016-10-24 MED ORDER — LOVASTATIN 40 MG PO TABS: ORAL_TABLET | ORAL | 2 refills | Status: DC

## 2016-10-24 MED ORDER — FUROSEMIDE 20 MG PO TABS: ORAL_TABLET | ORAL | 2 refills | Status: DC

## 2016-10-24 NOTE — Progress Notes (Signed)
Patient ID: Mitchell Gibbs, male   DOB: 26-Feb-1950, 66 y.o.   MRN: [id number]    Location:  [facility] Place of Service: OFFICE  Chief Complaint  Patient presents with  . Medical Management of Chronic Issues    4 month follow-up, previously seen by Dr.Green   . Medication Refill    Refilled levothyroxine, furosemide, lovastatin and metformin, #90  . Immunizations    Flu vaccine administered at work   . Health Maintenance    Refused HIV and Hep C screening   . Medication Management    Discuss Synthroid- News alert     HPI:  66 yo male seen today for f/u. He is a former Dr Nyoka Cowden pt. He has hot intolerance. No palpitations, HA, dizziness, change in bowel habits, SOB. No falls. Appetite is excellent and he sleeps well.  DM -  A1c 8.6%. LDL 89. Random urine microalbumin 0.8. Takes metformin, levemir. No numbness/tingling in hands/feet  HTN - stable on lasix, ASA, losartan HCT  Hypothyroidism - hx thyroid cancer and s/p total thyroidectomy. Takes synthroid. TSH 2.66. He reports hot intolerance. No longer followed by endocrinology  Hyperlipidemia - stable on lovastatin. LDL 89  Chronic LLE edema - stable on prn lasix. Doppler studies in past neg DVT. He believes it is related to callus on plantar surface of foot that was trimmed by Dr Nyoka Cowden in the past and it helped his swelling.  Past Medical History:  Diagnosis Date  . Bell's palsy    2005  . Carpal tunnel syndrome   . Cataract   . CKD stage 2 due to type 2 diabetes mellitus ([place]) 12/01/2015  . Diabetes mellitus without complication ([place])   . Edema   . Gout, unspecified   . Hyperlipidemia   . Hypertension   . Internal hemorrhoids without mention of complication   . Malignant neoplasm of thyroid gland ([place])   . Memory loss   . 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 Obesity, unspecified   . Other malaise and fatigue   . Other psoriasis   . Sleep apnea    no CPAP used 09-23-15  . Supraventricular premature beats   . Thyroid  disease   . Trigger finger (acquired)   . Unspecified sleep apnea     Past Surgical History:  Procedure Laterality Date  . CATARACT EXTRACTION W/ INTRAOCULAR LENS IMPLANT Right 06/05/2016   Dr. Arlina Robes  . COLONOSCOPY  10/07/2015   one polyp, Dr. Loletha Carrow  . TOTAL THYROIDECTOMY  2007   Dr. Georgina Quint    Patient Care Team: [name], DO as PCP - General (Internal Medicine)  Social History   Social History  . Marital status: Married    Spouse name: N/A  . Number of children: N/A  . Years of education: N/A   Occupational History  . Not on file.   Social History Main Topics  . Smoking status: Never Smoker  . Smokeless tobacco: Never Used  . Alcohol use No  . Drug use: No  . Sexual activity: Not on file   Other Topics Concern  . Not on file   Social History Narrative  . No narrative on file     reports that he has never smoked. He has never used smokeless tobacco. He reports that he does not drink alcohol or use drugs.  Family History  Problem Relation Age of Onset  . Cancer - Prostate Father   . Stomach cancer Father   . Hypertension Other   .  Colon cancer Neg Hx   . Esophageal cancer Neg Hx   . Rectal cancer Neg Hx    Family Status  Relation Status  . Mother Alive  . Father Deceased  . Sister Alive  . Brother Deceased  . Daughter Alive  . Sister Alive  . Daughter Alive  . Daughter Alive  . Other (Not Specified)  . Neg Hx (Not Specified)     No Known Allergies  Medications: Patient's Medications  New Prescriptions   No medications on file  Previous Medications   ASPIRIN EC 81 MG TABLET    Take 81 mg by mouth daily.   BLOOD GLUCOSE MONITORING SUPPL (ONETOUCH VERIO FLEX SYSTEM) W/DEVICE KIT    1 kit by Does not apply route daily.   FUROSEMIDE (LASIX) 20 MG TABLET    TAKE 1 TABLET BY MOUTH EVERY DAY AS NEEDED TO CONTROL EDEMA   INSULIN DETEMIR (LEVEMIR FLEXTOUCH) 100 UNIT/ML PEN    Inject 30 Units into the skin daily.   INSULIN PEN NEEDLE 31G  X 8 MM MISC    Use for insulin injection.   LEVOTHYROXINE (SYNTHROID, LEVOTHROID) 125 MCG TABLET    Take 125 mcg by mouth daily.   LOSARTAN-HYDROCHLOROTHIAZIDE (HYZAAR) 100-25 MG TABLET    TAKE 1 TABLET BY MOUTH EVERY DAY TO CONTROL BLOOD PRSSURE   LOVASTATIN (MEVACOR) 40 MG TABLET    TAKE 1 TABLET DAILY TO CONTROL CHOLESTEROL   METFORMIN (GLUCOPHAGE) 1000 MG TABLET    TAKE ONE TABLET BY MOUTH BEFORE BREAKFAST AND ONE BEFORE SUPPER TO CONTROL BLOOD SUGAR   ONE TOUCH ULTRA TEST TEST STRIP    TEST 2 TIMES DAILY  Modified Medications   Modified Medication Previous Medication   ZOSTER VAC RECOMB ADJUVANTED (SHINGRIX) INJECTION Zoster Vac Recomb Adjuvanted (SHINGRIX) injection      Inject 0.5 mLs into the muscle once.    Inject 0.5 mLs into the muscle once.  Discontinued Medications   CIALIS 20 MG TABLET    TAKE 1/2 TO 1 TABLET BY MOUTH PRIOR TO INTERCOURSE TO HELP WITH ERECTILE DYSFUNCTION   GUAIFENESIN (ROBITUSSIN) 100 MG/5ML LIQUID    Take 200 mg by mouth 3 (three) times daily as needed for cough.   PREDNISOLONE ACETATE (PRED FORTE) 1 % OPHTHALMIC SUSPENSION    One drop in right eye, taper down from 4 times daily to once daily   PROLENSA 0.07 % SOLN    One drop in right eye daily   SILDENAFIL (VIAGRA) 100 MG TABLET    Take 1 tablet (100 mg total) by mouth daily as needed for erectile dysfunction.   TERBINAFINE (LAMISIL AT) 1 % CREAM    Apply daily to rash on foot    Review of Systems  Cardiovascular: Positive for leg swelling.  Endocrine: Positive for heat intolerance.  All other systems reviewed and are negative.   Vitals:   10/24/16 1545  BP: 134/78  Pulse: 67  Temp: 98.5 F (36.9 C)  TempSrc: Oral  SpO2: 97%  Weight: 234 lb (106.1 kg)  Height: 6' (1.829 m)   Body mass index is 31.74 kg/m.  Physical Exam  Constitutional: He is oriented to person, place, and time. He appears well-developed and well-nourished.  HENT:  Mouth/Throat: Oropharynx is clear and moist.  MMM; no oral  thrush  Eyes: Pupils are equal, round, and reactive to light. No scleral icterus.  Neck: Neck supple. Carotid bruit is not present. No thyromegaly present.  Cardiovascular: Normal rate, regular rhythm, normal heart sounds  and intact distal pulses.  Exam reveals no gallop and no friction rub.   No murmur heard. +1 pitting LLE edema; no RLE edema. No calf TTP  Pulmonary/Chest: Effort normal and breath sounds normal. He has no wheezes. He has no rales. He exhibits no tenderness.  Abdominal: Soft. Normal appearance and bowel sounds are normal. He exhibits no distension, no abdominal bruit, no pulsatile midline mass and no mass. There is no hepatomegaly. There is no tenderness. There is no rigidity, no rebound and no guarding. No hernia.  Musculoskeletal: He exhibits deformity (right forearm).  Lymphadenopathy:    He has no cervical adenopathy.  Neurological: He is alert and oriented to person, place, and time. He has normal reflexes.  Skin: Skin is warm and dry. No rash noted.  Psychiatric: He has a normal mood and affect. His behavior is normal. Judgment and thought content normal.     Labs reviewed: No visits with results within 3 Month(s) from this visit.  Latest known visit with results is:  Appointment on 06/28/2016  Component Date Value Ref Range Status  . Hgb A1c MFr Bld 06/28/2016 8.6* <5.7 % Final   Comment:   For someone without known diabetes, a hemoglobin A1c value of 6.5% or greater indicates that they may have diabetes and this should be confirmed with a follow-up test.   For someone with known diabetes, a value <7% indicates that their diabetes is well controlled and a value greater than or equal to 7% indicates suboptimal control. A1c targets should be individualized based on duration of diabetes, age, comorbid conditions, and other considerations.   Currently, no consensus exists for use of hemoglobin A1c for diagnosis of diabetes for children.     . Mean Plasma  Glucose 06/28/2016 200  mg/dL Final  . Sodium 06/28/2016 140  135 - 146 mmol/L Final  . Potassium 06/28/2016 3.8  3.5 - 5.3 mmol/L Final  . Chloride 06/28/2016 100  98 - 110 mmol/L Final  . CO2 06/28/2016 30  20 - 31 mmol/L Final  . Glucose, Bld 06/28/2016 156* 65 - 99 mg/dL Final  . BUN 06/28/2016 20  7 - 25 mg/dL Final  . Creat 06/28/2016 1.39* 0.70 - 1.25 mg/dL Final   Comment:   For patients > or = 64 years of age: The upper reference limit for Creatinine is approximately 13% higher for people identified as African-American.     . Calcium 06/28/2016 9.8  8.6 - 10.3 mg/dL Final  . TSH 06/28/2016 2.66  0.40 - 4.50 mIU/L Final    No results found.   Assessment/Plan   ICD-10-CM   1. Essential hypertension I10 CMP with eGFR  2. Type 2 diabetes mellitus with stage 2 chronic kidney disease, with long-term current use of insulin (HCC) E11.22 metFORMIN (GLUCOPHAGE) 1000 MG tablet   N18.2 Hemoglobin A1c   Z79.4   3. Mixed hyperlipidemia E78.2 lovastatin (MEVACOR) 40 MG tablet    Lipid Panel  4. Edema, unspecified type R60.9 furosemide (LASIX) 20 MG tablet   LLE  5. Postoperative hypothyroidism E89.0 levothyroxine (SYNTHROID, LEVOTHROID) 125 MCG tablet    TSH    T4, Free  6. History of thyroid cancer Z85.850   7. Class 1 obesity due to excess calories without serious comorbidity with body mass index (BMI) of 30.0 to 30.9 in adult E66.09    Z68.30   8. High risk medication use Z79.899 CMP with eGFR   Continue current medications as ordered  Return to office for fasting  labs  Follow up in 4 mos for CPE/ECG  Lisabeth Mian S. Perlie Gold  [facility] [address] [place], [place] [address] [phone] Cell (Monday-Friday 8 AM - 5 PM) [phone] After 5 PM and follow prompts

## 2016-10-24 NOTE — Patient Instructions (Addendum)
Continue current medications as ordered  Return to office for fasting labs  Follow up in 4 mos for CPE/ECG

## 2016-10-24 NOTE — Progress Notes (Signed)
Patient ID: Mitchell Gibbs, male   DOB: 1950-12-26, 66 y.o.   MRN: [id number]   Location:      Place of Service:    Provider:   Patient Care Team: Mitchell Cranker, DO as PCP - General (Internal Medicine)  Extended Emergency Contact Information Primary Emergency Contact: Mitchell Gibbs,Mitchell Gibbs Address: [address]          [place], [place] [address] [place] Phone: [phone] Mobile Phone: [phone] Relation: Spouse  Code Status:  Goals of Care: Advanced Directive information Advanced Directives 06/21/2016  Does Patient Have a Medical Advance Directive? No  Would patient like information on creating a medical advance directive? -     Chief Complaint  Patient presents with  . Medical Management of Chronic Issues    4 month follow-up, previously seen by Dr.Green   . Medication Refill    Refilled levothyroxine, furosemide, lovastatin and metformin, #90  . Immunizations    Flu vaccine administered at work   . Health Maintenance    Refused HIV and Hep C screening   . Medication Management    Discuss Synthroid- News alert     HPI: Patient is a 66 y.o. male seen in today for an annual wellness exam. He has questions about levothyroxine. He saw a commercial on TV that stated there might be an issue with the medication. He wasn't sure what it was because he only saw the end of the commercial and wasn't paying much attention to it.  Hyperlipidemia, unspecified hyperlipidemia type - no recent lab  DM - A1c 8.6%. Takes metformin and levemir 30 units.   Essential hypertension - controlled with losartan-HCTZ.  Malignant neoplasm of thyroid gland ([place]) - resolved. He was released by his endocrinologist a couple of months ago.   CKD stage 2 due to type 2 diabetes mellitus ([place]) - no recent lab  Erectile dysfunction, unspecified erectile dysfunction type - has encountered problem getting his Cialis filled due to insurance coverage issues  Depression screen [facility] 2/9  10/24/2016 07/28/2015 07/01/2014 10/14/2013 03/12/2013  Decreased Interest 0 0 0 0 0  Down, Depressed, Hopeless 0 0 0 0 0  PHQ - 2 Score 0 0 0 0 0    Fall Risk  10/24/2016 04/04/2016 07/28/2015 12/29/2014 10/06/2014  Falls in the past year? _0    MMSE - Mini Mental State Exam 12/29/2014  Not completed: (No Data)  Orientation to time 5  Orientation to Place 5  Registration 3  Attention/ Calculation 5  Recall 1  Language- name 2 objects 2  Language- repeat 1  Language- follow 3 step command 3  Language- read & follow direction 1  Write a sentence 1  Copy design 1  Total score 28     Health Maintenance  Topic Date Due  . Hepatitis C Screening  01/16/2018 (Originally 22-Aug-1950)  . HIV Screening  01/16/2018 (Originally 08/31/1965)  . HEMOGLOBIN A1C  12/28/2016  . OPHTHALMOLOGY EXAM  02/22/2017  . FOOT EXAM  06/21/2017  . TETANUS/TDAP  03/05/2021  . COLONOSCOPY  10/06/2025  . INFLUENZA VACCINE  Completed    Urinary incontinence? Functional Status Survey:   Exercise? Diet? No exam data present Hearing:   Dentition: Pain:  Past Medical History:  Diagnosis Date  . Bell's palsy    2005  . Carpal tunnel syndrome   . Cataract   . CKD stage 2 due to type 2 diabetes mellitus ([place]) 12/01/2015  . Diabetes mellitus without complication ([place])   .  Edema   . Gout, unspecified   . Hyperlipidemia   . Hypertension   . Internal hemorrhoids without mention of complication   . Malignant neoplasm of thyroid gland ([place])   . Memory loss   . 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 Obesity, unspecified   . Other malaise and fatigue   . Other psoriasis   . Sleep apnea    no CPAP used 09-23-15  . Supraventricular premature beats   . Thyroid disease   . Trigger finger (acquired)   . Unspecified sleep apnea     Past Surgical History:  Procedure Laterality Date  . CATARACT EXTRACTION W/ INTRAOCULAR LENS IMPLANT Right 06/05/2016   Dr. Arlina Robes  . COLONOSCOPY  10/07/2015   one  polyp, Dr. Loletha Carrow  . TOTAL THYROIDECTOMY  2007   Dr. Georgina Quint    Family History  Problem Relation Age of Onset  . Cancer - Prostate Father   . Stomach cancer Father   . Hypertension Other   . Colon cancer Neg Hx   . Esophageal cancer Neg Hx   . Rectal cancer Neg Hx     Social History   Social History  . Marital status: Married    Spouse name: N/A  . Number of children: N/A  . Years of education: N/A   Social History Main Topics  . Smoking status: Never Smoker  . Smokeless tobacco: Never Used  . Alcohol use No  . Drug use: No  . Sexual activity: Not Asked   Other Topics Concern  . None   Social History Narrative  . None    reports that he has never smoked. He has never used smokeless tobacco. He reports that he does not drink alcohol or use drugs.   No Known Allergies  Outpatient Encounter Prescriptions as of 10/24/2016  Medication Sig  . aspirin EC 81 MG tablet Take 81 mg by mouth daily.  . Blood Glucose Monitoring Suppl (ONETOUCH VERIO FLEX SYSTEM) w/Device KIT 1 kit by Does not apply route daily.  . furosemide (LASIX) 20 MG tablet TAKE 1 TABLET BY MOUTH EVERY DAY AS NEEDED TO CONTROL EDEMA  . Insulin Detemir (LEVEMIR FLEXTOUCH) 100 UNIT/ML Pen Inject 30 Units into the skin daily.  . Insulin Pen Needle 31G X 8 MM MISC Use for insulin injection.  Marland Kitchen levothyroxine (SYNTHROID, LEVOTHROID) 125 MCG tablet Take 125 mcg by mouth daily.  Marland Kitchen losartan-hydrochlorothiazide (HYZAAR) 100-25 MG tablet TAKE 1 TABLET BY MOUTH EVERY DAY TO CONTROL BLOOD PRSSURE  . lovastatin (MEVACOR) 40 MG tablet TAKE 1 TABLET DAILY TO CONTROL CHOLESTEROL  . metFORMIN (GLUCOPHAGE) 1000 MG tablet TAKE ONE TABLET BY MOUTH BEFORE BREAKFAST AND ONE BEFORE SUPPER TO CONTROL BLOOD SUGAR  . ONE TOUCH ULTRA TEST test strip TEST 2 TIMES DAILY  . Zoster Vac Recomb Adjuvanted [facility]) injection Inject 0.5 mLs into the muscle once.  . [DISCONTINUED] Zoster Vac Recomb Adjuvanted (SHINGRIX) injection  Inject 0.5 mLs into the muscle once.  . [DISCONTINUED] CIALIS 20 MG tablet TAKE 1/2 TO 1 TABLET BY MOUTH PRIOR TO INTERCOURSE TO HELP WITH ERECTILE DYSFUNCTION (Patient not taking: Reported on 10/24/2016)  . [DISCONTINUED] guaiFENesin (ROBITUSSIN) 100 MG/5ML liquid Take 200 mg by mouth 3 (three) times daily as needed for cough.  . [DISCONTINUED] prednisoLONE acetate (PRED FORTE) 1 % ophthalmic suspension One drop in right eye, taper down from 4 times daily to once daily  . [DISCONTINUED] PROLENSA 0.07 % SOLN One drop in right eye daily  . [  DISCONTINUED] sildenafil (VIAGRA) 100 MG tablet Take 1 tablet (100 mg total) by mouth daily as needed for erectile dysfunction. (Patient not taking: Reported on 10/24/2016)  . [DISCONTINUED] terbinafine (LAMISIL AT) 1 % cream Apply daily to rash on foot (Patient not taking: Reported on 10/24/2016)   Facility-Administered Encounter Medications as of 10/24/2016  Medication  . 0.9 %  sodium chloride infusion     Review of Systems:  Review of Systems  Constitutional: Negative for activity change, appetite change, chills, diaphoresis, fatigue and fever.  HENT: Negative for congestion, dental problem, ear pain, mouth sores, nosebleeds, postnasal drip, rhinorrhea, sinus pain, sinus pressure, sneezing and sore throat.   Respiratory: Negative for apnea, cough, chest tightness, shortness of breath, wheezing and stridor.   Cardiovascular: Negative for chest pain, palpitations and leg swelling.  Gastrointestinal: Negative for abdominal distention, abdominal pain, blood in stool, constipation, diarrhea, nausea and vomiting.  Endocrine: Negative for cold intolerance and heat intolerance.  Genitourinary: Negative for decreased urine volume, difficulty urinating, dysuria, flank pain, frequency, hematuria, penile pain, penile swelling, scrotal swelling, testicular pain and urgency.  Musculoskeletal: Negative for arthralgias, back pain, gait problem, joint swelling and myalgias.    Skin: Negative for color change.  Neurological: Negative for dizziness, facial asymmetry, speech difficulty, light-headedness and headaches.  Psychiatric/Behavioral: Negative for agitation, behavioral problems, confusion, decreased concentration, dysphoric mood, self-injury, sleep disturbance and suicidal ideas.    Physical Exam: Vitals:   10/24/16 1545  BP: 134/78  Pulse: 67  Temp: 98.5 F (36.9 C)  TempSrc: Oral  SpO2: 97%  Weight: 234 lb (106.1 kg)  Height: 6' (1.829 m)   Body mass index is 31.74 kg/m. Physical Exam  Constitutional: He is oriented to person, place, and time. He appears well-developed and well-nourished. No distress.  HENT:  Head: Normocephalic and atraumatic.  Right Ear: External ear normal.  Left Ear: External ear normal.  Nose: Nose normal.  Mouth/Throat: Oropharynx is clear and moist. No oropharyngeal exudate.  Eyes: Pupils are equal, round, and reactive to light. Conjunctivae and EOM are normal. Right eye exhibits no discharge. Left eye exhibits no discharge. No scleral icterus.  Neck: Normal range of motion. Neck supple. No JVD present. No tracheal deviation present.  Cardiovascular: Normal rate, regular rhythm, normal heart sounds and intact distal pulses.  Exam reveals no gallop and no friction rub.   No murmur heard. Pulmonary/Chest: Effort normal and breath sounds normal. No stridor. No respiratory distress. He has no wheezes. He has no rales. He exhibits no tenderness.  Abdominal: Soft. Bowel sounds are normal. He exhibits no distension and no mass. There is no tenderness. There is no rebound and no guarding.  Musculoskeletal: Normal range of motion. He exhibits no edema, tenderness or deformity.  Lymphadenopathy:    He has no cervical adenopathy.  Neurological: He is alert and oriented to person, place, and time.  Skin: Skin is warm and dry. He is not diaphoretic.  Psychiatric: He has a normal mood and affect. His behavior is normal. Judgment and  thought content normal.    Labs reviewed: Basic Metabolic Panel:  Recent Labs  11/26/15 0855 03/28/16 0908 06/28/16 1104  NA  --  138 140  K  --  4.0 3.8  CL  --  99 100  CO2  --  27 30  GLUCOSE  --  155* 156*  BUN  --  23 20  CREATININE  --  1.43* 1.39*  CALCIUM  --  9.5 9.8  TSH 3.05 0.91 2.66  Liver Function Tests:  Recent Labs  03/28/16 0908  AST 22  ALT 18  ALKPHOS 64  BILITOT 0.3  PROT 7.2  ALBUMIN 4.0   No results for input(s): LIPASE, AMYLASE in the last 8760 hours. No results for input(s): AMMONIA in the last 8760 hours. CBC: No results for input(s): WBC, NEUTROABS, HGB, HCT, MCV, PLT in the last 8760 hours. Lipid Panel:  Recent Labs  11/26/15 0855 03/28/16 0908  CHOL 170 165  HDL 59 58  LDLCALC 97 89  TRIG 69 89  CHOLHDL 2.9 2.8   Lab Results  Component Value Date   HGBA1C 8.6 (H) 06/28/2016    Procedures: No results found.  Assessment/Plan  1. Type 2 diabetes mellitus with stage 2 chronic kidney disease, with long-term current use of insulin ([facility])  Continue medications as ordered.  - metFORMIN (GLUCOPHAGE) 1000 MG tablet; TAKE ONE TABLET BY MOUTH BEFORE BREAKFAST AND ONE BEFORE SUPPER TO CONTROL BLOOD SUGAR  Dispense: 180 tablet; Refill: 2  2. Essential hypertension  Continue current medications as ordered.  3. Hyperlipidemia  Continue medications as ordered.  4. Hyperthyroidism  Continue medication as ordered.   Labs/tests ordered:    Return for fasting labs: Lipid, BMP, TSH, A1c, CBC  Next appt:

## 2016-10-26 ENCOUNTER — Other Ambulatory Visit: Payer: BLUE CROSS/BLUE SHIELD

## 2016-10-26 DIAGNOSIS — E782 Mixed hyperlipidemia: Secondary | ICD-10-CM

## 2016-10-26 DIAGNOSIS — Z794 Long term (current) use of insulin: Secondary | ICD-10-CM

## 2016-10-26 DIAGNOSIS — I10 Essential (primary) hypertension: Secondary | ICD-10-CM | POA: Diagnosis not present

## 2016-10-26 DIAGNOSIS — E890 Postprocedural hypothyroidism: Secondary | ICD-10-CM

## 2016-10-26 DIAGNOSIS — N182 Chronic kidney disease, stage 2 (mild): Secondary | ICD-10-CM

## 2016-10-26 DIAGNOSIS — Z79899 Other long term (current) drug therapy: Secondary | ICD-10-CM

## 2016-10-26 DIAGNOSIS — E1122 Type 2 diabetes mellitus with diabetic chronic kidney disease: Secondary | ICD-10-CM | POA: Diagnosis not present

## 2016-10-27 LAB — COMPLETE METABOLIC PANEL WITH GFR
AG Ratio: 1.2 (calc) (ref 1.0–2.5)
ALT: 15 U/L (ref 9–46)
AST: 18 U/L (ref 10–35)
Albumin: 3.8 g/dL (ref 3.6–5.1)
Alkaline phosphatase (APISO): 64 U/L (ref 40–115)
BUN/Creatinine Ratio: 15 (calc) (ref 6–22)
BUN: 21 mg/dL (ref 7–25)
CALCIUM: 9.6 mg/dL (ref 8.6–10.3)
CO2: 28 mmol/L (ref 20–32)
CREATININE: 1.4 mg/dL — AB (ref 0.70–1.25)
Chloride: 102 mmol/L (ref 98–110)
GFR, EST AFRICAN AMERICAN: 61 mL/min/{1.73_m2} (ref >=60)
GFR, EST NON AFRICAN AMERICAN: 53 mL/min/{1.73_m2} — AB (ref >=60)
GLOBULIN: 3.3 g/dL (ref 1.9–3.7)
Glucose, Bld: 127 mg/dL — ABNORMAL HIGH (ref 65–99)
Potassium: 3.8 mmol/L (ref 3.5–5.3)
SODIUM: 139 mmol/L (ref 135–146)
TOTAL PROTEIN: 7.1 g/dL (ref 6.1–8.1)
Total Bilirubin: 0.4 mg/dL (ref 0.2–1.2)

## 2016-10-27 LAB — LIPID PANEL
CHOL/HDL RATIO: 2.4 (calc) (ref <5.0)
Cholesterol: 167 mg/dL (ref <200)
HDL: 70 mg/dL (ref >40)
LDL Cholesterol (Calc): 82 mg/dL (calc)
NON-HDL CHOLESTEROL (CALC): 97 mg/dL (ref <130)
TRIGLYCERIDES: 69 mg/dL (ref <150)

## 2016-10-27 LAB — HEMOGLOBIN A1C
Hgb A1c MFr Bld: 7.7 % of total Hgb — ABNORMAL HIGH (ref <5.7)
Mean Plasma Glucose: 174 (calc)
eAG (mmol/L): 9.7 (calc)

## 2016-10-27 LAB — T4, FREE: Free T4: 1.4 ng/dL (ref 0.8–1.8)

## 2016-10-27 LAB — TSH: TSH: 1.96 m[IU]/L (ref 0.40–4.50)

## 2017-02-20 ENCOUNTER — Telehealth: Payer: Self-pay | Admitting: *Deleted

## 2017-02-20 MED ORDER — CHLORPHEN-PE-ACETAMINOPHEN 4-10-325 MG PO TABS: ORAL_TABLET | ORAL | 0 refills | Status: DC

## 2017-02-20 NOTE — Telephone Encounter (Signed)
Recommend Mucinex OTC as directed; may try norel AD #30 take 1 tab po every 6 hrs prn cough/congestion, no RF

## 2017-02-20 NOTE — Telephone Encounter (Addendum)
Patient called back and stated that he didn't want to come in for an appointment. Wants to know what you would advise for him to take to help his symptoms.   Patient c/o cold, upper respiratory symptoms or sinus infection  1.) How long have you had your symptoms?3-4 day  2.) Any fever, if yes did you check with a thermometer? No fever  3.) Any myalgias (muscle aches)? No  4.) Are you coughing up mucus, if yes is it discolored? Coughing, non productive. Nasal drainage  5.) What have you tried for symptoms? DayQuill with no relief  6.) Have you been around anyone sick? Yes, Coworkers.    I will forward your message to your provider and call with response. If your symptoms progress please seek medical attention at Urgent Care

## 2017-02-20 NOTE — Telephone Encounter (Signed)
Patient called and left message on Clinical Intake stating that he had cough and chills and took OTC medication without relief.   I called and left message on patient's voicemail offering an appointment for today to be evaluated.

## 2017-02-20 NOTE — Addendum Note (Signed)
Addended by: Rafael Bihari A on: 02/20/2017 02:48 PM   Modules accepted: Orders

## 2017-02-20 NOTE — Telephone Encounter (Signed)
Patient notified and agreed.  

## 2017-03-05 ENCOUNTER — Other Ambulatory Visit: Payer: Self-pay

## 2017-03-05 ENCOUNTER — Other Ambulatory Visit: Payer: BLUE CROSS/BLUE SHIELD

## 2017-03-05 DIAGNOSIS — E1122 Type 2 diabetes mellitus with diabetic chronic kidney disease: Secondary | ICD-10-CM

## 2017-03-05 DIAGNOSIS — N182 Chronic kidney disease, stage 2 (mild): Secondary | ICD-10-CM

## 2017-03-05 DIAGNOSIS — I10 Essential (primary) hypertension: Secondary | ICD-10-CM

## 2017-03-05 DIAGNOSIS — E079 Disorder of thyroid, unspecified: Secondary | ICD-10-CM | POA: Diagnosis not present

## 2017-03-05 DIAGNOSIS — E785 Hyperlipidemia, unspecified: Secondary | ICD-10-CM | POA: Diagnosis not present

## 2017-03-05 DIAGNOSIS — Z794 Long term (current) use of insulin: Secondary | ICD-10-CM

## 2017-03-06 ENCOUNTER — Encounter: Payer: BLUE CROSS/BLUE SHIELD | Admitting: Internal Medicine

## 2017-03-06 ENCOUNTER — Other Ambulatory Visit: Payer: Self-pay

## 2017-03-06 DIAGNOSIS — R7989 Other specified abnormal findings of blood chemistry: Secondary | ICD-10-CM

## 2017-03-06 DIAGNOSIS — D649 Anemia, unspecified: Secondary | ICD-10-CM

## 2017-03-06 LAB — MICROALBUMIN / CREATININE URINE RATIO
Creatinine, Urine: 256 mg/dL (ref 20–320)
MICROALB UR: 3.5 mg/dL
Microalb Creat Ratio: 14 mcg/mg creat (ref <30)

## 2017-03-06 LAB — URINALYSIS, ROUTINE W REFLEX MICROSCOPIC
BILIRUBIN URINE: NEGATIVE
Bacteria, UA: NONE SEEN /HPF
GLUCOSE, UA: NEGATIVE
Hgb urine dipstick: NEGATIVE
Hyaline Cast: NONE SEEN /LPF
Ketones, ur: NEGATIVE
LEUKOCYTES UA: NEGATIVE
NITRITE: NEGATIVE
PH: 6 (ref 5.0–8.0)
RBC / HPF: NONE SEEN /HPF (ref 0–2)
SQUAMOUS EPITHELIAL / LPF: NONE SEEN /HPF (ref <=5)
Specific Gravity, Urine: 1.025 (ref 1.001–1.03)

## 2017-03-06 LAB — COMPLETE METABOLIC PANEL WITH GFR
AG RATIO: 1.3 (calc) (ref 1.0–2.5)
ALKALINE PHOSPHATASE (APISO): 54 U/L (ref 40–115)
ALT: 12 U/L (ref 9–46)
AST: 15 U/L (ref 10–35)
Albumin: 3.9 g/dL (ref 3.6–5.1)
BILIRUBIN TOTAL: 0.4 mg/dL (ref 0.2–1.2)
BUN/Creatinine Ratio: 11 (calc) (ref 6–22)
BUN: 15 mg/dL (ref 7–25)
CHLORIDE: 104 mmol/L (ref 98–110)
CO2: 32 mmol/L (ref 20–32)
Calcium: 9.7 mg/dL (ref 8.6–10.3)
Creat: 1.34 mg/dL — ABNORMAL HIGH (ref 0.70–1.25)
GFR, EST NON AFRICAN AMERICAN: 56 mL/min/{1.73_m2} — AB (ref >=60)
GFR, Est African American: 64 mL/min/{1.73_m2} (ref >=60)
GLOBULIN: 2.9 g/dL (ref 1.9–3.7)
Glucose, Bld: 137 mg/dL — ABNORMAL HIGH (ref 65–99)
POTASSIUM: 4.4 mmol/L (ref 3.5–5.3)
SODIUM: 140 mmol/L (ref 135–146)
Total Protein: 6.8 g/dL (ref 6.1–8.1)

## 2017-03-06 LAB — CBC WITH DIFFERENTIAL/PLATELET
BASOS ABS: 19 {cells}/uL (ref 0–200)
Basophils Relative: 0.5 %
Eosinophils Absolute: 100 cells/uL (ref 15–500)
Eosinophils Relative: 2.7 %
HEMATOCRIT: 37.7 % — AB (ref 38.5–50.0)
Hemoglobin: 12.6 g/dL — ABNORMAL LOW (ref 13.2–17.1)
LYMPHS ABS: 1184 {cells}/uL (ref 850–3900)
MCH: 27 pg (ref 27.0–33.0)
MCHC: 33.4 g/dL (ref 32.0–36.0)
MCV: 80.7 fL (ref 80.0–100.0)
MPV: 9.3 fL (ref 7.5–12.5)
Monocytes Relative: 11.4 %
NEUTROS PCT: 53.4 %
Neutro Abs: 1976 cells/uL (ref 1500–7800)
Platelets: 284 10*3/uL (ref 140–400)
RBC: 4.67 10*6/uL (ref 4.20–5.80)
RDW: 13.6 % (ref 11.0–15.0)
TOTAL LYMPHOCYTE: 32 %
WBC: 3.7 10*3/uL — ABNORMAL LOW (ref 3.8–10.8)
WBCMIX: 422 {cells}/uL (ref 200–950)

## 2017-03-06 LAB — LIPID PANEL
CHOL/HDL RATIO: 2.6 (calc) (ref <5.0)
CHOLESTEROL: 145 mg/dL (ref <200)
HDL: 56 mg/dL (ref >40)
LDL CHOLESTEROL (CALC): 76 mg/dL
NON-HDL CHOLESTEROL (CALC): 89 mg/dL (ref <130)
Triglycerides: 58 mg/dL (ref <150)

## 2017-03-06 LAB — TSH: TSH: 2.34 mIU/L (ref 0.40–4.50)

## 2017-03-06 LAB — HEMOGLOBIN A1C
EAG (MMOL/L): 10.3 (calc)
Hgb A1c MFr Bld: 8.1 % of total Hgb — ABNORMAL HIGH (ref <5.7)
Mean Plasma Glucose: 186 (calc)

## 2017-03-06 LAB — T4, FREE: Free T4: 1.2 ng/dL (ref 0.8–1.8)

## 2017-03-06 MED ORDER — INSULIN DETEMIR 100 UNIT/ML FLEXPEN: 40.0000 [IU] | PEN_INJECTOR | Freq: Every day | SUBCUTANEOUS | 11 refills | Status: DC

## 2017-04-03 ENCOUNTER — Other Ambulatory Visit: Payer: BLUE CROSS/BLUE SHIELD

## 2017-04-03 DIAGNOSIS — D649 Anemia, unspecified: Secondary | ICD-10-CM | POA: Diagnosis not present

## 2017-04-03 DIAGNOSIS — R7989 Other specified abnormal findings of blood chemistry: Secondary | ICD-10-CM | POA: Diagnosis not present

## 2017-04-03 LAB — CBC WITH DIFFERENTIAL/PLATELET
BASOS ABS: 40 {cells}/uL (ref 0–200)
BASOS PCT: 1 %
EOS ABS: 140 {cells}/uL (ref 15–500)
Eosinophils Relative: 3.5 %
HCT: 39.4 % (ref 38.5–50.0)
HEMOGLOBIN: 13.2 g/dL (ref 13.2–17.1)
Lymphs Abs: 1236 cells/uL (ref 850–3900)
MCH: 26.9 pg — AB (ref 27.0–33.0)
MCHC: 33.5 g/dL (ref 32.0–36.0)
MCV: 80.4 fL (ref 80.0–100.0)
MONOS PCT: 9.2 %
MPV: 9.6 fL (ref 7.5–12.5)
NEUTROS ABS: 2216 {cells}/uL (ref 1500–7800)
Neutrophils Relative %: 55.4 %
Platelets: 267 10*3/uL (ref 140–400)
RBC: 4.9 10*6/uL (ref 4.20–5.80)
RDW: 13.7 % (ref 11.0–15.0)
TOTAL LYMPHOCYTE: 30.9 %
WBC: 4 10*3/uL (ref 3.8–10.8)
WBCMIX: 368 {cells}/uL (ref 200–950)

## 2017-04-04 ENCOUNTER — Other Ambulatory Visit: Payer: Self-pay | Admitting: Internal Medicine

## 2017-04-05 ENCOUNTER — Telehealth: Payer: Self-pay | Admitting: *Deleted

## 2017-04-05 DIAGNOSIS — I10 Essential (primary) hypertension: Secondary | ICD-10-CM

## 2017-04-05 NOTE — Telephone Encounter (Signed)
What do they have available ACEI/ARB related?

## 2017-04-05 NOTE — Telephone Encounter (Signed)
Received fax from [address] [phone] stating that patient's medication Losartan-HCTZ 100-25 once daily is currently on backordered/unavailable. Please advise.

## 2017-04-05 NOTE — Telephone Encounter (Signed)
Called pharmacy and spoke with Thayer Headings and she stated that they do have most strengths of the Valsartan HCTZ. Please Advise.

## 2017-04-06 MED ORDER — VALSARTAN-HYDROCHLOROTHIAZIDE 320-25 MG PO TABS: 1.0000 | ORAL_TABLET | Freq: Every day | ORAL | 6 refills | Status: DC

## 2017-04-06 NOTE — Telephone Encounter (Signed)
done

## 2017-04-06 NOTE — Addendum Note (Signed)
Addended by: Rafael Bihari A on: 04/06/2017 09:57 AM   Modules accepted: Orders

## 2017-04-06 NOTE — Telephone Encounter (Signed)
Spoke with Dr. Eulas Post and medication changed to Diovan 320/25. Dr. Eulas Post sent Rx to pharmacy.  Called and [facility] for patient with directions and medication change information. Medication list updated.

## 2017-05-15 ENCOUNTER — Ambulatory Visit (INDEPENDENT_AMBULATORY_CARE_PROVIDER_SITE_OTHER): Payer: BLUE CROSS/BLUE SHIELD | Admitting: Internal Medicine

## 2017-05-15 ENCOUNTER — Encounter: Payer: Self-pay | Admitting: Internal Medicine

## 2017-05-15 VITALS — BP 118/80 | HR 67 | Temp 98.1°F | Ht 72.0 in | Wt 231.6 lb

## 2017-05-15 DIAGNOSIS — E1122 Type 2 diabetes mellitus with diabetic chronic kidney disease: Secondary | ICD-10-CM

## 2017-05-15 DIAGNOSIS — Z794 Long term (current) use of insulin: Secondary | ICD-10-CM | POA: Diagnosis not present

## 2017-05-15 DIAGNOSIS — E890 Postprocedural hypothyroidism: Secondary | ICD-10-CM

## 2017-05-15 DIAGNOSIS — I10 Essential (primary) hypertension: Secondary | ICD-10-CM | POA: Diagnosis not present

## 2017-05-15 DIAGNOSIS — E782 Mixed hyperlipidemia: Secondary | ICD-10-CM

## 2017-05-15 DIAGNOSIS — Z79899 Other long term (current) drug therapy: Secondary | ICD-10-CM

## 2017-05-15 DIAGNOSIS — E6609 Other obesity due to excess calories: Secondary | ICD-10-CM | POA: Diagnosis not present

## 2017-05-15 DIAGNOSIS — N182 Chronic kidney disease, stage 2 (mild): Secondary | ICD-10-CM | POA: Diagnosis not present

## 2017-05-15 DIAGNOSIS — Z0000 Encounter for general adult medical examination without abnormal findings: Secondary | ICD-10-CM

## 2017-05-15 DIAGNOSIS — Z6831 Body mass index (BMI) 31.0-31.9, adult: Secondary | ICD-10-CM | POA: Diagnosis not present

## 2017-05-15 NOTE — Progress Notes (Signed)
Patient ID: Mitchell Gibbs, male   DOB: 04/20/1950, 67 y.o.   MRN: [id number]   Location:  [facility]  Place of Service:  OFFICE  Provider: Arletha Grippe, DO  Patient Care Team: [name], DO as PCP - General (Internal Medicine)  Extended Emergency Contact Information Primary Emergency Contact: Galambos,[name] Address: [address]          [place], [place] [address] Johnnette Litter of [place] Phone: [phone] Mobile Phone: [phone] Relation: Spouse  Code Status: full code Goals of Care: Advanced Directive information Advanced Directives 06/21/2016  Does Patient Have a Medical Advance Directive? No  Would patient like information on creating a medical advance directive? -     Chief Complaint  Patient presents with  . Annual Exam    CPE, EKG  . Medication Refill    No Refills  . Advance Care Planning    No ACP  . Health Maintenance    Eye Exam Due    HPI: Patient is a 67 y.o. male seen in today for a comprehensive exam.    Obesity - he is c/a weight. He has not reduced carb/fatty food intake. BMI 31.41  DM -  A1c 8.1%. LDL 89. Microalbumin/Cr ratio 14. Takes metformin, levemir. No numbness/tingling in hands/feet; LDL 76. He takes ARB and statin  HTN - stable on lasix, ASA, losartan HCT.   Hypothyroidism - hx thyroid cancer and s/p total thyroidectomy. Stable on synthroid. TSH 2.34. He reports hot intolerance. No longer followed by endocrinology  Hyperlipidemia - stable on lovastatin. LDL 76  Chronic LLE edema - controlled with  prn lasix. Doppler studies in past neg DVT. He believes it is related to callus on plantar surface of foot that was trimmed by Dr Nyoka Cowden in the past and it helped his swelling.   Depression screen [facility] 2/9 10/24/2016 07/28/2015 07/01/2014 10/14/2013 03/12/2013  Decreased Interest 0 0 0 0 0  Down, Depressed, Hopeless 0 0 0 0 0  PHQ - 2 Score 0 0 0 0 0    Fall Risk  10/24/2016 04/04/2016 07/28/2015 12/29/2014 10/06/2014  Falls in the past year?  _0    MMSE - Mini Mental State Exam 12/29/2014  Not completed: (No Data)  Orientation to time 5  Orientation to Place 5  Registration 3  Attention/ Calculation 5  Recall 1  Language- name 2 objects 2  Language- repeat 1  Language- follow 3 step command 3  Language- read & follow direction 1  Write a sentence 1  Copy design 1  Total score 28     Health Maintenance  Topic Date Due  . OPHTHALMOLOGY EXAM  02/22/2017  . Hepatitis C Screening  01/16/2018 (Originally 03-28-1950)  . HIV Screening  01/16/2018 (Originally 08/31/1965)  . INFLUENZA VACCINE  08/16/2017  . HEMOGLOBIN A1C  09/02/2017  . FOOT EXAM  10/24/2017  . TETANUS/TDAP  03/05/2021  . COLONOSCOPY  10/06/2025     Past Medical History:  Diagnosis Date  . Bell's palsy    2005  . Carpal tunnel syndrome   . Cataract   . CKD stage 2 due to type 2 diabetes mellitus ([place]) 12/01/2015  . Diabetes mellitus without complication ([place])   . Edema   . Gout, unspecified   . Hyperlipidemia   . Hypertension   . Internal hemorrhoids without mention of complication   . Malignant neoplasm of thyroid gland ([place])   . Memory loss   . 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  Obesity, unspecified   . Other malaise and fatigue   . Other psoriasis   . Sleep apnea    no CPAP used 09-23-15  . Supraventricular premature beats   . Thyroid disease   . Trigger finger (acquired)   . Unspecified sleep apnea     Past Surgical History:  Procedure Laterality Date  . CATARACT EXTRACTION W/ INTRAOCULAR LENS IMPLANT Right 06/05/2016   Dr. Arlina Robes  . COLONOSCOPY  10/07/2015   one polyp, Dr. Loletha Carrow  . TOTAL THYROIDECTOMY  2007   Dr. Georgina Quint    Family History  Problem Relation Age of Onset  . Cancer - Prostate Father   . Stomach cancer Father   . Hypertension Other   . Colon cancer Neg Hx   . Esophageal cancer Neg Hx   . Rectal cancer Neg Hx    Family Status  Relation Name Status  . Mother  Alive  . Father   Deceased  . Sister Burman Foster  . Brother Marten Deceased  . Daughter [employer], retail  . Sister Albertha Ghee  . Daughter [employer]  . Daughter [employer]  . Other  (Not Specified)  . Neg Hx  (Not Specified)    Social History   Socioeconomic History  . Marital status: Married    Spouse name: Not on file  . Number of children: Not on file  . Years of education: Not on file  . Highest education level: Not on file  Occupational History  . Not on file  Social Needs  . Financial resource strain: Not on file  . Food insecurity:    Worry: Not on file    Inability: Not on file  . Transportation needs:    Medical: Not on file    Non-medical: Not on file  Tobacco Use  . Smoking status: Never Smoker  . Smokeless tobacco: Never Used  Substance and Sexual Activity  . Alcohol use: No  . Drug use: No  . Sexual activity: Not on file  Lifestyle  . Physical activity:    Days per week: Not on file    Minutes per session: Not on file  . Stress: Not on file  Relationships  . Social connections:    Talks on phone: Not on file    Gets together: Not on file    Attends religious service: Not on file    Active member of club or organization: Not on file    Attends meetings of clubs or organizations: Not on file    Relationship status: Not on file  . Intimate partner violence:    Fear of current or ex partner: Not on file    Emotionally abused: Not on file    Physically abused: Not on file    Forced sexual activity: Not on file  Other Topics Concern  . Not on file  Social History Narrative  . Not on file    No Known Allergies  Allergies as of 05/15/2017   No Known Allergies     Medication List        Accurate as of 05/15/17  3:19 PM. Always use your most recent med list.          aspirin EC 81 MG tablet Take 81 mg by mouth daily.   Chlorphen-PE-Acetaminophen 4-10-325 MG Tabs Commonly known as:  NOREL AD Take one tablet by mouth every 6 hours as needed for  cough/congestion   furosemide 20 MG tablet Commonly known as:  LASIX TAKE 1  TABLET BY MOUTH EVERY DAY AS NEEDED TO CONTROL EDEMA   Insulin Detemir 100 UNIT/ML Pen Commonly known as:  LEVEMIR FLEXPEN Inject 40 Units into the skin daily at 10 pm.   Insulin Pen Needle 31G X 8 MM Misc Use for insulin injection.   levothyroxine 125 MCG tablet Commonly known as:  SYNTHROID, LEVOTHROID Take 1 tablet (125 mcg total) by mouth daily.   lovastatin 40 MG tablet Commonly known as:  MEVACOR TAKE 1 TABLET DAILY TO CONTROL CHOLESTEROL   metFORMIN 1000 MG tablet Commonly known as:  GLUCOPHAGE TAKE ONE TABLET BY MOUTH BEFORE BREAKFAST AND ONE BEFORE SUPPER TO CONTROL BLOOD SUGAR   ONE TOUCH ULTRA TEST test strip Generic drug:  glucose blood TEST 2 TIMES DAILY   ONETOUCH VERIO FLEX SYSTEM w/Device Kit 1 kit by Does not apply route daily.   valsartan-hydrochlorothiazide 320-25 MG tablet Commonly known as:  DIOVAN HCT Take 1 tablet by mouth daily.        Review of Systems:  Review of Systems  Cardiovascular: Positive for leg swelling.  Musculoskeletal: Positive for arthralgias.  All other systems reviewed and are negative.   Physical Exam: Vitals:   05/15/17 1440  BP: 118/80  Pulse: 67  Temp: 98.1 F (36.7 C)  TempSrc: Oral  SpO2: 98%  Weight: 231 lb 9.6 oz (105.1 kg)  Height: 6' (1.829 m)   Body mass index is 31.41 kg/m. Physical Exam  Constitutional: He is oriented to person, place, and time. He appears well-developed and well-nourished. No distress.  HENT:  Head: Normocephalic and atraumatic.  Right Ear: Hearing, tympanic membrane, external ear and ear canal normal.  Left Ear: Hearing, tympanic membrane, external ear and ear canal normal.  Mouth/Throat: Uvula is midline, oropharynx is clear and moist and mucous membranes are normal. He does not have dentures.  MMM; no oral thrush  Eyes: Pupils are equal, round, and reactive to light. Conjunctivae, EOM and lids are  normal. No scleral icterus.  Neck: Trachea normal and normal range of motion. Neck supple. Carotid bruit is not present. No thyroid mass and no thyromegaly present.  Cardiovascular: Regular rhythm and intact distal pulses. Bradycardia present. Exam reveals no gallop and no friction rub.  Murmur (1/6 SEM) heard. +1 pitting LLE edema worse at ankle; no RLE edema. No calf TTP b/l  Pulmonary/Chest: Effort normal and breath sounds normal. He has no wheezes. He has no rhonchi. He has no rales. He exhibits no mass and no tenderness. Right breast exhibits no inverted nipple, no mass, no nipple discharge, no skin change and no tenderness. Left breast exhibits no inverted nipple, no mass, no nipple discharge, no skin change and no tenderness. Breasts are symmetrical.  Abdominal: Soft. Normal appearance, normal aorta and bowel sounds are normal. He exhibits no distension, no abdominal bruit, no pulsatile midline mass and no mass. There is no hepatosplenomegaly or hepatomegaly. There is no tenderness. There is no rigidity, no rebound and no guarding. No hernia. Hernia confirmed negative in the right inguinal area and confirmed negative in the left inguinal area.  Genitourinary: Rectum normal, prostate normal, testes normal and penis normal. Rectal exam shows guaiac negative stool. Right testis shows no mass. Left testis shows no mass. Circumcised. No penile tenderness.  Genitourinary Comments: Chaperone present  Musculoskeletal: Normal range of motion. He exhibits no deformity.  Lymphadenopathy:       Head (right side): No posterior auricular adenopathy present.       Head (left side): No posterior auricular  adenopathy present.    He has no cervical adenopathy.       Right: No supraclavicular adenopathy present.       Left: No supraclavicular adenopathy present.  Neurological: He is alert and oriented to person, place, and time. He has normal strength and normal reflexes. No cranial nerve deficit. Gait normal.    Skin: Skin is warm, dry and intact. No rash noted. Nails show no clubbing.  Psychiatric: He has a normal mood and affect. His speech is normal and behavior is normal. Judgment and thought content normal. Cognition and memory are normal.    Labs reviewed:  Basic Metabolic Panel: Recent Labs    06/28/16 1104 10/26/16 0917 03/05/17 0921  NA 140 139 140  K 3.8 3.8 4.4  CL 100 102 104  CO2 30 28 32  GLUCOSE 156* 127* 137*  BUN _0 CREATININE 1.39* 1.40* 1.34*  CALCIUM 9.8 9.6 9.7  TSH 2.66 1.96 2.34   Liver Function Tests: Recent Labs    10/26/16 0917 03/05/17 0921  AST 18 15  ALT 15 12  BILITOT 0.4 0.4  PROT 7.1 6.8   No results for input(s): LIPASE, AMYLASE in the last 8760 hours. No results for input(s): AMMONIA in the last 8760 hours. CBC: Recent Labs    03/05/17 0921 04/03/17 0855  WBC 3.7* 4.0  NEUTROABS 1,976 2,216  HGB 12.6* 13.2  HCT 37.7* 39.4  MCV 80.7 80.4  PLT 284 267   Lipid Panel: Recent Labs    10/26/16 0917 03/05/17 0921  CHOL 167 145  HDL 70 56  LDLCALC 82 76  TRIG 69 58  CHOLHDL 2.4 2.6   Lab Results  Component Value Date   HGBA1C 8.1 (H) 03/05/2017    Procedures: No results found. ECG OBTAINED AND REVIEWED BY MYSELF: SB @ 56 bpm, nml axis, LAE. No acute ischemic changes. No change since Jan 2015  Assessment/Plan   ICD-10-CM   1. Well adult exam Z00.00   2. Type 2 diabetes mellitus with stage 2 chronic kidney disease, with long-term current use of insulin (HCC) E11.22    N18.2    Z79.4   3. Essential hypertension I10 EKG 12-Lead  4. Postoperative hypothyroidism E89.0   5. Mixed hyperlipidemia E78.2   6. High risk medication use Z79.899   7. Class 1 obesity due to excess calories without serious comorbidity with body mass index (BMI) of 31.0 to 31.9 in adult E66.09    Z68.31    Recommend Keto diet and adjust to diabetes/ high cholesterol  Continue current medications as ordered  Follow up in 4 mos for DM, HTn,  hyperlipidemia, obesity. Fasting labs prior to appt  Keeping you healthy handout given   Cordella Register. Perlie Gold  [facility] [address] [place], [place] [address] [phone] Cell (Monday-Friday 8 AM - 5 PM) [phone] After 5 PM and follow prompts

## 2017-05-15 NOTE — Patient Instructions (Addendum)
Recommend Keto diet and adjust to diabetes/ high cholesterol  Continue current medications as ordered  Follow up in 4 mos for DM, HTn, hyperlipidemia, obesity. Fasting labs prior to appt  Keeping you healthy  Get these tests  Blood pressure- Have your blood pressure checked once a year by your healthcare provider.  Normal blood pressure is 120/80  Weight- Have your body mass index (BMI) calculated to screen for obesity.  BMI is a measure of body fat based on height and weight. You can also calculate your own BMI at ViewBanking.si.  Cholesterol- Have your cholesterol checked every year.  Diabetes- Have your blood sugar checked regularly if you have high blood pressure, high cholesterol, have a family history of diabetes or if you are overweight.  Screening for Colon Cancer- Colonoscopy starting at age 43.  Screening may begin sooner depending on your family history and other health conditions. Follow up colonoscopy as directed by your Gastroenterologist.  Screening for Prostate Cancer- Both blood work (PSA) and a rectal exam help screen for Prostate Cancer.  Screening begins at age 16 with African-American men and at age 45 with Caucasian men.  Screening may begin sooner depending on your family history.  Take these medicines  Aspirin- One aspirin daily can help prevent Heart disease and Stroke.  Flu shot- Every fall.  Tetanus- Every 10 years.  Zostavax- Once after the age of 5 to prevent Shingles.  Pneumonia shot- Once after the age of 52; if you are younger than 23, ask your healthcare provider if you need a Pneumonia shot.  Take these steps  Don't smoke- If you do smoke, talk to your doctor about quitting.  For tips on how to quit, go to www.smokefree.gov or call 1-800-QUIT-NOW.  Be physically active- Exercise 5 days a week for at least 30 minutes.  If you are not already physically active start slow and gradually work up to 30 minutes of moderate physical activity.   Examples of moderate activity include walking briskly, mowing the yard, dancing, swimming, bicycling, etc.  Eat a healthy diet- Eat a variety of healthy food such as fruits, vegetables, low fat milk, low fat cheese, yogurt, lean meant, poultry, fish, beans, tofu, etc. For more information go to www.thenutritionsource.org  Drink alcohol in moderation- Limit alcohol intake to less than two drinks a day. Never drink and drive.  Dentist- Brush and floss twice daily; visit your dentist twice a year.  Depression- Your emotional health is as important as your physical health. If you're feeling down, or losing interest in things you would normally enjoy please talk to your healthcare provider.  Eye exam- Visit your eye doctor every year.  Safe sex- If you may be exposed to a sexually transmitted infection, use a condom.  Seat belts- Seat belts can save your life; always wear one.  Smoke/Carbon Monoxide detectors- These detectors need to be installed on the appropriate level of your home.  Replace batteries at least once a year.  Skin cancer- When out in the sun, cover up and use sunscreen 15 SPF or higher.  Violence- If anyone is threatening you, please tell your healthcare provider.  Living Will/ Health care power of attorney- Speak with your healthcare provider and family.

## 2017-05-16 ENCOUNTER — Encounter: Payer: Self-pay | Admitting: Internal Medicine

## 2017-05-16 ENCOUNTER — Other Ambulatory Visit: Payer: Self-pay | Admitting: Internal Medicine

## 2017-05-16 DIAGNOSIS — E890 Postprocedural hypothyroidism: Secondary | ICD-10-CM | POA: Insufficient documentation

## 2017-05-16 DIAGNOSIS — Z79899 Other long term (current) drug therapy: Secondary | ICD-10-CM | POA: Insufficient documentation

## 2017-06-28 LAB — HM DIABETES EYE EXAM

## 2017-06-29 ENCOUNTER — Encounter: Payer: Self-pay | Admitting: *Deleted

## 2017-07-09 ENCOUNTER — Telehealth: Payer: Self-pay | Admitting: *Deleted

## 2017-07-09 NOTE — Telephone Encounter (Signed)
Patient called and left message on Clinical intake and stated that he gotten bite by something and it was red. Stated that the workplace nurse placed some neosporin on it and told him he might want to call his PCP for an appointment.   Tried calling patient back and LMOM to return call and speak with receptionist and schedule an appointment to be evaluated.

## 2017-07-13 NOTE — Telephone Encounter (Signed)
Patient called back and said the bite looks much better. No bullseye rash, drainage, swelling, fever, dizziness or abd pain. He said he would like to monitor it over the weekend and will call on Monday if he feels he needs to be seen.

## 2017-08-19 ENCOUNTER — Other Ambulatory Visit: Payer: Self-pay | Admitting: Internal Medicine

## 2017-08-20 ENCOUNTER — Telehealth: Payer: Self-pay

## 2017-08-20 NOTE — Telephone Encounter (Signed)
Patient requested a refill of sildenafil 100 mg tablets. This medication was discontinued 06/21/16 due to cost but patient states that [facility] has a lower cost and he would like to have the medication refilled now.    Please advise if it is ok to add sildenafil 100 mg to medication list, if so please indicate instructions.

## 2017-08-20 NOTE — Telephone Encounter (Signed)
A refill request was received from [facility] on [address] for sildenafil 100 mg tablets. This medication was discontinued from patient's medication list on 06/21/2016 due to cost of the medication.    I left a message for patient to call the office to confirm if he requested this refill.

## 2017-08-20 NOTE — Telephone Encounter (Signed)
Patient did confirm that he requested this medication to be refilled. Since this medication is not on his current list, the request was pended to provider for approval.

## 2017-08-21 MED ORDER — SILDENAFIL CITRATE 100 MG PO TABS: 100.0000 mg | ORAL_TABLET | Freq: Every day | ORAL | 2 refills | Status: DC | PRN

## 2017-08-21 NOTE — Telephone Encounter (Signed)
Per Dr.Carter ok to add medication sildenafil 100 mg tablet and fill #10 with 2 refills  RX sent, patient aware

## 2017-08-21 NOTE — Addendum Note (Signed)
Addended by: Logan Bores on: 08/21/2017 04:40 PM   Modules accepted: Orders

## 2017-08-29 ENCOUNTER — Telehealth: Payer: Self-pay

## 2017-08-29 NOTE — Telephone Encounter (Signed)
Patient called to request a call directly from Dr Eulas Post to discuss her leaving. He refused to give me any more information and insisted that Dr. Eulas Post call him personally at [phone].

## 2017-08-31 NOTE — Telephone Encounter (Signed)
LMOVM

## 2017-09-05 ENCOUNTER — Encounter: Payer: Self-pay | Admitting: Internal Medicine

## 2017-09-11 DIAGNOSIS — H02831 Dermatochalasis of right upper eyelid: Secondary | ICD-10-CM | POA: Diagnosis not present

## 2017-09-11 DIAGNOSIS — H18413 Arcus senilis, bilateral: Secondary | ICD-10-CM | POA: Diagnosis not present

## 2017-09-11 DIAGNOSIS — Z961 Presence of intraocular lens: Secondary | ICD-10-CM | POA: Diagnosis not present

## 2017-09-11 DIAGNOSIS — H2512 Age-related nuclear cataract, left eye: Secondary | ICD-10-CM | POA: Diagnosis not present

## 2017-09-14 ENCOUNTER — Other Ambulatory Visit: Payer: Self-pay

## 2017-09-14 DIAGNOSIS — Z79899 Other long term (current) drug therapy: Secondary | ICD-10-CM

## 2017-09-14 DIAGNOSIS — N182 Chronic kidney disease, stage 2 (mild): Secondary | ICD-10-CM

## 2017-09-14 DIAGNOSIS — I10 Essential (primary) hypertension: Secondary | ICD-10-CM

## 2017-09-14 DIAGNOSIS — Z794 Long term (current) use of insulin: Secondary | ICD-10-CM

## 2017-09-14 DIAGNOSIS — E782 Mixed hyperlipidemia: Secondary | ICD-10-CM

## 2017-09-14 DIAGNOSIS — E1122 Type 2 diabetes mellitus with diabetic chronic kidney disease: Secondary | ICD-10-CM

## 2017-09-14 DIAGNOSIS — E890 Postprocedural hypothyroidism: Secondary | ICD-10-CM

## 2017-09-18 ENCOUNTER — Other Ambulatory Visit: Payer: BLUE CROSS/BLUE SHIELD

## 2017-09-19 ENCOUNTER — Other Ambulatory Visit: Payer: BLUE CROSS/BLUE SHIELD

## 2017-09-19 DIAGNOSIS — I10 Essential (primary) hypertension: Secondary | ICD-10-CM | POA: Diagnosis not present

## 2017-09-19 DIAGNOSIS — E782 Mixed hyperlipidemia: Secondary | ICD-10-CM | POA: Diagnosis not present

## 2017-09-19 DIAGNOSIS — E1122 Type 2 diabetes mellitus with diabetic chronic kidney disease: Secondary | ICD-10-CM | POA: Diagnosis not present

## 2017-09-19 DIAGNOSIS — E890 Postprocedural hypothyroidism: Secondary | ICD-10-CM | POA: Diagnosis not present

## 2017-09-20 LAB — LIPID PANEL
CHOLESTEROL: 150 mg/dL (ref <200)
HDL: 59 mg/dL (ref >40)
LDL Cholesterol (Calc): 73 mg/dL (calc)
Non-HDL Cholesterol (Calc): 91 mg/dL (calc) (ref <130)
Total CHOL/HDL Ratio: 2.5 (calc) (ref <5.0)
Triglycerides: 97 mg/dL (ref <150)

## 2017-09-20 LAB — BASIC METABOLIC PANEL WITH GFR
BUN/Creatinine Ratio: 13 (calc) (ref 6–22)
BUN: 17 mg/dL (ref 7–25)
CALCIUM: 9.8 mg/dL (ref 8.6–10.3)
CO2: 31 mmol/L (ref 20–32)
Chloride: 100 mmol/L (ref 98–110)
Creat: 1.29 mg/dL — ABNORMAL HIGH (ref 0.70–1.25)
GFR, EST NON AFRICAN AMERICAN: 58 mL/min/{1.73_m2} — AB (ref >=60)
GFR, Est African American: 67 mL/min/{1.73_m2} (ref >=60)
GLUCOSE: 114 mg/dL — AB (ref 65–99)
POTASSIUM: 4.2 mmol/L (ref 3.5–5.3)
SODIUM: 139 mmol/L (ref 135–146)

## 2017-09-20 LAB — HEMOGLOBIN A1C
EAG (MMOL/L): 10.3 (calc)
HEMOGLOBIN A1C: 8.1 %{Hb} — AB (ref <5.7)
Mean Plasma Glucose: 186 (calc)

## 2017-09-20 LAB — ALT: ALT: 13 U/L (ref 9–46)

## 2017-09-20 LAB — T4, FREE: FREE T4: 1.5 ng/dL (ref 0.8–1.8)

## 2017-09-20 LAB — TSH: TSH: 1.4 mIU/L (ref 0.40–4.50)

## 2017-09-25 ENCOUNTER — Ambulatory Visit: Payer: BLUE CROSS/BLUE SHIELD | Admitting: Internal Medicine

## 2017-09-25 ENCOUNTER — Encounter: Payer: Self-pay | Admitting: Internal Medicine

## 2017-09-25 VITALS — BP 120/82 | HR 67 | Temp 98.0°F | Ht 72.0 in | Wt 230.6 lb

## 2017-09-25 DIAGNOSIS — E1122 Type 2 diabetes mellitus with diabetic chronic kidney disease: Secondary | ICD-10-CM | POA: Diagnosis not present

## 2017-09-25 DIAGNOSIS — Z794 Long term (current) use of insulin: Secondary | ICD-10-CM

## 2017-09-25 DIAGNOSIS — E782 Mixed hyperlipidemia: Secondary | ICD-10-CM

## 2017-09-25 DIAGNOSIS — N182 Chronic kidney disease, stage 2 (mild): Secondary | ICD-10-CM

## 2017-09-25 DIAGNOSIS — I10 Essential (primary) hypertension: Secondary | ICD-10-CM

## 2017-09-25 DIAGNOSIS — Z79899 Other long term (current) drug therapy: Secondary | ICD-10-CM

## 2017-09-25 DIAGNOSIS — E890 Postprocedural hypothyroidism: Secondary | ICD-10-CM | POA: Diagnosis not present

## 2017-09-25 MED ORDER — LOVASTATIN 40 MG PO TABS: ORAL_TABLET | ORAL | 2 refills | Status: DC

## 2017-09-25 MED ORDER — SILDENAFIL CITRATE 100 MG PO TABS: ORAL_TABLET | ORAL | 0 refills | Status: DC

## 2017-09-25 MED ORDER — METFORMIN HCL 1000 MG PO TABS: ORAL_TABLET | ORAL | 2 refills | Status: DC

## 2017-09-25 MED ORDER — VALSARTAN-HYDROCHLOROTHIAZIDE 320-25 MG PO TABS: 1.0000 | ORAL_TABLET | Freq: Every day | ORAL | 6 refills | Status: DC

## 2017-09-25 MED ORDER — GLUCOSE BLOOD VI STRP: ORAL_STRIP | 11 refills | Status: DC

## 2017-09-25 MED ORDER — ONETOUCH DELICA LANCETS FINE MISC: 11 refills | Status: DC

## 2017-09-25 MED ORDER — INSULIN DETEMIR 100 UNIT/ML FLEXPEN: 44.0000 [IU] | PEN_INJECTOR | Freq: Every day | SUBCUTANEOUS | 6 refills | Status: DC

## 2017-09-25 MED ORDER — LEVOTHYROXINE SODIUM 125 MCG PO TABS: 125.0000 ug | ORAL_TABLET | Freq: Every day | ORAL | 2 refills | Status: DC

## 2017-09-25 NOTE — Progress Notes (Signed)
Patient ID: Mitchell Gibbs, male   DOB: 08-14-1950, 67 y.o.   MRN: [id number]   Location:  [facility] OFFICE  Provider: DR Arletha Grippe  Code Status:  Goals of Care:  Advanced Directives 09/25/2017  Does Patient Have a Medical Advance Directive? No  Would patient like information on creating a medical advance directive? -     Chief Complaint  Patient presents with  . Medical Management of Chronic Issues    pt is being seen for a routine visit.   . Audit C Screening    score of 0    HPI: Patient is a 67 y.o. male seen today for medical management of chronic diseases.  He has no concerns.   Obesity - he remains c/a weight. Weight down 1 lb. He has not reduced carb/fatty food intake. BMI 31.27  DM -  A1c 8.1%. LDL 73. Microalbumin/Cr ratio 14. Takes metformin, levemir. No numbness/tingling in hands/feet; LDL 76. He takes ARB and statin  HTN - stable on lasix, ASA, losartan HCT.   Hypothyroidism - hx thyroid cancer and s/p total thyroidectomy. Stable on synthroid. TSH 1.4. He reports hot intolerance. No longer followed by endocrinology  Hyperlipidemia - stable on lovastatin. LDL 73  Chronic LLE edema - controlled with  prn lasix. Doppler studies in past neg DVT. He believes it is related to callus on plantar surface of foot that was trimmed by Dr Nyoka Cowden in the past and it helped his swelling.    Past Medical History:  Diagnosis Date  . Bell's palsy    2005  . Carpal tunnel syndrome   . Cataract   . CKD stage 2 due to type 2 diabetes mellitus ([place]) 12/01/2015  . Diabetes mellitus without complication ([place])   . Edema   . Gout, unspecified   . Hyperlipidemia   . Hypertension   . Internal hemorrhoids without mention of complication   . Malignant neoplasm of thyroid gland ([place])   . Memory loss   . 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 Obesity, unspecified   . Other malaise and fatigue   . Other psoriasis   . Sleep apnea    no CPAP used 09-23-15  . Supraventricular  premature beats   . Thyroid disease   . Trigger finger (acquired)   . Unspecified sleep apnea     Past Surgical History:  Procedure Laterality Date  . CATARACT EXTRACTION W/ INTRAOCULAR LENS IMPLANT Right 06/05/2016   Dr. Arlina Robes  . COLONOSCOPY  10/07/2015   one polyp, Dr. Loletha Carrow  . TOTAL THYROIDECTOMY  2007   Dr. Georgina Quint     reports that he has never smoked. He has never used smokeless tobacco. He reports that he does not drink alcohol or use drugs. Social History   Socioeconomic History  . Marital status: Married    Spouse name: Not on file  . Number of children: Not on file  . Years of education: Not on file  . Highest education level: Not on file  Occupational History  . Not on file  Social Needs  . Financial resource strain: Not on file  . Food insecurity:    Worry: Not on file    Inability: Not on file  . Transportation needs:    Medical: Not on file    Non-medical: Not on file  Tobacco Use  . Smoking status: Never Smoker  . Smokeless tobacco: Never Used  Substance and Sexual Activity  . Alcohol use: No  .  Drug use: No  . Sexual activity: Not on file  Lifestyle  . Physical activity:    Days per week: Not on file    Minutes per session: Not on file  . Stress: Not on file  Relationships  . Social connections:    Talks on phone: Not on file    Gets together: Not on file    Attends religious service: Not on file    Active member of club or organization: Not on file    Attends meetings of clubs or organizations: Not on file    Relationship status: Not on file  . Intimate partner violence:    Fear of current or ex partner: Not on file    Emotionally abused: Not on file    Physically abused: Not on file    Forced sexual activity: Not on file  Other Topics Concern  . Not on file  Social History Narrative  . Not on file    Family History  Problem Relation Age of Onset  . Cancer - Prostate Father   . Stomach cancer Father   . Hypertension Other     . Colon cancer Neg Hx   . Esophageal cancer Neg Hx   . Rectal cancer Neg Hx     No Known Allergies  Outpatient Encounter Medications as of 09/25/2017  Medication Sig  . aspirin EC 81 MG tablet Take 81 mg by mouth daily.  . Blood Glucose Monitoring Suppl (ONETOUCH VERIO FLEX SYSTEM) w/Device KIT 1 kit by Does not apply route daily.  . Chlorphen-PE-Acetaminophen (NOREL AD) 4-10-325 MG TABS Take one tablet by mouth every 6 hours as needed for cough/congestion  . furosemide (LASIX) 20 MG tablet TAKE 1 TABLET BY MOUTH EVERY DAY AS NEEDED TO CONTROL EDEMA  . Insulin Detemir (LEVEMIR FLEXPEN) 100 UNIT/ML Pen Inject 40 Units into the skin daily at 10 pm.  . Insulin Pen Needle (B-D ULTRAFINE III SHORT PEN) 31G X 8 MM MISC USE FOR INSULIN INJECTION.  Marland Kitchen levothyroxine (SYNTHROID, LEVOTHROID) 125 MCG tablet Take 1 tablet (125 mcg total) by mouth daily.  Marland Kitchen lovastatin (MEVACOR) 40 MG tablet TAKE 1 TABLET DAILY TO CONTROL CHOLESTEROL  . metFORMIN (GLUCOPHAGE) 1000 MG tablet TAKE ONE TABLET BY MOUTH BEFORE BREAKFAST AND ONE BEFORE SUPPER TO CONTROL BLOOD SUGAR  . ONE TOUCH ULTRA TEST test strip TEST 2 TIMES DAILY  . sildenafil (VIAGRA) 100 MG tablet TAKE 1 TABLET BY MOUTH DAILY AS NEEDED FOR ERECTILE DYSFUNCTION  . valsartan-hydrochlorothiazide (DIOVAN HCT) 320-25 MG tablet Take 1 tablet by mouth daily.   Facility-Administered Encounter Medications as of 09/25/2017  Medication  . 0.9 %  sodium chloride infusion    Review of Systems:  Review of Systems  All other systems reviewed and are negative.   Health Maintenance  Topic Date Due  . PNA vac Low Risk Adult (2 of 2 - PPSV23) 08/30/2017  . INFLUENZA VACCINE  11/15/2017 (Originally 08/16/2017)  . Hepatitis C Screening  01/16/2018 (Originally Dec 19, 1950)  . HIV Screening  01/16/2018 (Originally 08/31/1967)  . HEMOGLOBIN A1C  03/20/2018  . FOOT EXAM  05/16/2018  . OPHTHALMOLOGY EXAM  06/29/2018  . TETANUS/TDAP  03/05/2021  . COLONOSCOPY  10/06/2025     Physical Exam: Vitals:   09/25/17 1538  BP: 120/82  Pulse: 67  Temp: 98 F (36.7 C)  TempSrc: Oral  SpO2: 99%  Weight: 230 lb 9.6 oz (104.6 kg)  Height: 6' (1.829 m)   Body mass index is 31.27 kg/m. Physical  Exam  Constitutional: He is oriented to person, place, and time. He appears well-developed and well-nourished.  HENT:  Mouth/Throat: Oropharynx is clear and moist.  MMM; no oral thrush  Eyes: Pupils are equal, round, and reactive to light. No scleral icterus.  Neck: Neck supple. Carotid bruit is not present. No thyromegaly present.  Cardiovascular: Normal rate, regular rhythm, normal heart sounds and intact distal pulses. Exam reveals no gallop and no friction rub.  No murmur heard. no distal LE swelling. No calf TTP  Pulmonary/Chest: Effort normal and breath sounds normal. He has no wheezes. He has no rales. He exhibits no tenderness.  Abdominal: Soft. Bowel sounds are normal. He exhibits no distension, no abdominal bruit, no pulsatile midline mass and no mass. There is no hepatomegaly. There is no tenderness. There is no rebound and no guarding.  obese  Lymphadenopathy:    He has no cervical adenopathy.  Neurological: He is alert and oriented to person, place, and time. He has normal reflexes.  Skin: Skin is warm and dry. No rash noted.  Psychiatric: He has a normal mood and affect. His behavior is normal. Judgment and thought content normal.    Labs reviewed: Basic Metabolic Panel: Recent Labs    10/26/16 0917 03/05/17 0921 09/19/17 1045  NA 139 140 139  K 3.8 4.4 4.2  CL 102 104 100  CO2 28 32 31  GLUCOSE 127* 137* 114*  BUN _0 CREATININE 1.40* 1.34* 1.29*  CALCIUM 9.6 9.7 9.8  TSH 1.96 2.34 1.40   Liver Function Tests: Recent Labs    10/26/16 0917 03/05/17 0921 09/19/17 1045  AST 18 15  --   ALT _1 BILITOT 0.4 0.4  --   PROT 7.1 6.8  --    No results for input(s): LIPASE, AMYLASE in the last 8760 hours. No results for  input(s): AMMONIA in the last 8760 hours. CBC: Recent Labs    03/05/17 0921 04/03/17 0855  WBC 3.7* 4.0  NEUTROABS 1,976 2,216  HGB 12.6* 13.2  HCT 37.7* 39.4  MCV 80.7 80.4  PLT 284 267   Lipid Panel: Recent Labs    10/26/16 0917 03/05/17 0921 09/19/17 1045  CHOL 167 145 150  HDL 70 56 59  LDLCALC 82 76 73  TRIG 69 58 97  CHOLHDL 2.4 2.6 2.5   Lab Results  Component Value Date   HGBA1C 8.1 (H) 09/19/2017    Procedures since last visit: No results found.  Assessment/Plan   ICD-10-CM   1. Mixed hyperlipidemia E78.2 lovastatin (MEVACOR) 40 MG tablet  2. Type 2 diabetes mellitus with stage 2 chronic kidney disease, with long-term current use of insulin (HCC) E11.22 metFORMIN (GLUCOPHAGE) 1000 MG tablet   N18.2 glucose blood (ONE TOUCH ULTRA TEST) test strip   Z79.4 Insulin Detemir (LEVEMIR FLEXPEN) 100 UNIT/ML Pen  3. Essential hypertension I10 valsartan-hydrochlorothiazide (DIOVAN HCT) 320-25 MG tablet  4. Postoperative hypothyroidism E89.0 levothyroxine (SYNTHROID, LEVOTHROID) 125 MCG tablet  5. High risk medication use Z79.899    Labs reviewed with pt - INCREASE INSULIN 44 UNITS  AT BEDTIME  Continue other medications as ordered  Follow up with new PCP in 3 mos for DM, HTN, hyperlipidemia, CKD, thyroid.    Trenisha Lafavor S. Perlie Gold  [facility] [address] [place], [place] [address] [phone] Cell (Monday-Friday 8 AM - 5 PM) [phone] After 5 PM and follow prompts

## 2017-09-25 NOTE — Patient Instructions (Addendum)
INCREASE INSULIN 44 UNITS  AT BEDTIME  Continue other medications as ordered  Follow up with new PCP in 3 mos for DM, HTN, hyperlipidemia, CKD, thyroid.

## 2017-09-26 ENCOUNTER — Encounter: Payer: Self-pay | Admitting: Internal Medicine

## 2017-11-02 ENCOUNTER — Encounter: Payer: Self-pay | Admitting: Internal Medicine

## 2017-11-02 ENCOUNTER — Ambulatory Visit (INDEPENDENT_AMBULATORY_CARE_PROVIDER_SITE_OTHER): Payer: BLUE CROSS/BLUE SHIELD | Admitting: Internal Medicine

## 2017-11-02 VITALS — BP 110/70 | HR 68 | Temp 98.3°F | Ht 72.0 in | Wt 226.0 lb

## 2017-11-02 DIAGNOSIS — Z125 Encounter for screening for malignant neoplasm of prostate: Secondary | ICD-10-CM

## 2017-11-02 DIAGNOSIS — R634 Abnormal weight loss: Secondary | ICD-10-CM | POA: Diagnosis not present

## 2017-11-02 DIAGNOSIS — W07XXXA Fall from chair, initial encounter: Secondary | ICD-10-CM | POA: Diagnosis not present

## 2017-11-02 DIAGNOSIS — E890 Postprocedural hypothyroidism: Secondary | ICD-10-CM | POA: Diagnosis not present

## 2017-11-02 DIAGNOSIS — E1122 Type 2 diabetes mellitus with diabetic chronic kidney disease: Secondary | ICD-10-CM | POA: Diagnosis not present

## 2017-11-02 DIAGNOSIS — N182 Chronic kidney disease, stage 2 (mild): Secondary | ICD-10-CM

## 2017-11-02 DIAGNOSIS — G4719 Other hypersomnia: Secondary | ICD-10-CM

## 2017-11-02 LAB — COMPLETE METABOLIC PANEL WITH GFR
AG RATIO: 1.4 (calc) (ref 1.0–2.5)
ALKALINE PHOSPHATASE (APISO): 61 U/L (ref 40–115)
ALT: 12 U/L (ref 9–46)
AST: 18 U/L (ref 10–35)
Albumin: 4.1 g/dL (ref 3.6–5.1)
BUN/Creatinine Ratio: 14 (calc) (ref 6–22)
BUN: 19 mg/dL (ref 7–25)
CO2: 30 mmol/L (ref 20–32)
Calcium: 9.8 mg/dL (ref 8.6–10.3)
Chloride: 103 mmol/L (ref 98–110)
Creat: 1.35 mg/dL — ABNORMAL HIGH (ref 0.70–1.25)
GFR, EST NON AFRICAN AMERICAN: 55 mL/min/{1.73_m2} — AB (ref >=60)
GFR, Est African American: 63 mL/min/{1.73_m2} (ref >=60)
Globulin: 3 g/dL (calc) (ref 1.9–3.7)
Glucose, Bld: 119 mg/dL — ABNORMAL HIGH (ref 65–99)
Potassium: 4.4 mmol/L (ref 3.5–5.3)
Sodium: 141 mmol/L (ref 135–146)
Total Bilirubin: 0.4 mg/dL (ref 0.2–1.2)
Total Protein: 7.1 g/dL (ref 6.1–8.1)

## 2017-11-02 LAB — CBC WITH DIFFERENTIAL/PLATELET
BASOS ABS: 29 {cells}/uL (ref 0–200)
Basophils Relative: 0.8 %
EOS PCT: 2.8 %
Eosinophils Absolute: 101 cells/uL (ref 15–500)
HEMATOCRIT: 40.3 % (ref 38.5–50.0)
Hemoglobin: 13.4 g/dL (ref 13.2–17.1)
LYMPHS ABS: 1292 {cells}/uL (ref 850–3900)
MCH: 27 pg (ref 27.0–33.0)
MCHC: 33.3 g/dL (ref 32.0–36.0)
MCV: 81.1 fL (ref 80.0–100.0)
MPV: 9.5 fL (ref 7.5–12.5)
Monocytes Relative: 8.1 %
NEUTROS ABS: 1886 {cells}/uL (ref 1500–7800)
Neutrophils Relative %: 52.4 %
Platelets: 255 10*3/uL (ref 140–400)
RBC: 4.97 10*6/uL (ref 4.20–5.80)
RDW: 13.1 % (ref 11.0–15.0)
Total Lymphocyte: 35.9 %
WBC mixed population: 292 cells/uL (ref 200–950)
WBC: 3.6 10*3/uL — ABNORMAL LOW (ref 3.8–10.8)

## 2017-11-02 LAB — TSH: TSH: 0.48 mIU/L (ref 0.40–4.50)

## 2017-11-02 LAB — PSA: PSA: 0.5 ng/mL (ref <=4.0)

## 2017-11-02 NOTE — Patient Instructions (Signed)
Continue current medications as ordered  Will call with lab results  Will call with sleep study appt  Follow up as scheduled or sooner if need be

## 2017-11-02 NOTE — Progress Notes (Signed)
Patient ID: Mitchell Gibbs, male   DOB: 12-25-1950, 67 y.o.   MRN: [id number]   [facility] OFFICE  Provider: DR Arletha Grippe  Code Status:  Goals of Care:  Advanced Directives 09/25/2017  Does Patient Have a Medical Advance Directive? No  Would patient like information on creating a medical advance directive? -     Chief Complaint  Patient presents with  . Acute Visit    fall on Sunday 10/28/17    HPI: Patient is a 67 y.o. male seen today for an acute visit for fall 10/28/17 from kitchen chair. Fall unwitnessed. Prior to incident, he traveled several hours after minimum sleep. 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 He states he sat atr the kitchen table upon arriving home, drank 24 oz beer and fell asleep while sitting in chair.  No head trauma. Family c/a situation as he has lost 15 lbs unintentionally over the last 18 mos - down 4 lbs since last ov 09/25/17. He has not taken any lasix.  Obesity - Weight down 4 lb since last month, unintentional. BMI 30.65  DM -  A1c 8.1%. LDL 73. Microalbumin/Cr ratio 14. Takes metformin, levemir. No numbness/tingling in hands/feet; LDL 76. He takes ARB and statin  HTN - stable on  ASA, losartan HCT.   Hypothyroidism - hx thyroid cancer and s/p total thyroidectomy. Stable on synthroid. TSH 1.40, T4 free 1.45. He reports hot intolerance. No longer followed by endocrinology  Hyperlipidemia - stable on lovastatin. LDL 73  CKD - eGFR nml. Cr 1.29  Past Medical History:  Diagnosis Date  . Bell's palsy    2005  . Carpal tunnel syndrome   . Cataract   . CKD stage 2 due to type 2 diabetes mellitus ([place]) 12/01/2015  . Diabetes mellitus without complication ([place])   . Edema   . Gout, unspecified   . Hyperlipidemia   . Hypertension   . Internal hemorrhoids without mention of complication   . Malignant neoplasm of thyroid gland ([place])   . Memory loss   . 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 Obesity, unspecified   . Other malaise and fatigue   . Other  psoriasis   . Sleep apnea    no CPAP used 09-23-15  . Supraventricular premature beats   . Thyroid disease   . Trigger finger (acquired)   . Unspecified sleep apnea     Past Surgical History:  Procedure Laterality Date  . CATARACT EXTRACTION W/ INTRAOCULAR LENS IMPLANT Right 06/05/2016   Dr. Arlina Robes  . COLONOSCOPY  10/07/2015   one polyp, Dr. Loletha Carrow  . TOTAL THYROIDECTOMY  2007   Dr. Georgina Quint     reports that he has never smoked. He has never used smokeless tobacco. He reports that he does not drink alcohol or use drugs. Social History   Socioeconomic History  . Marital status: Married    Spouse name: Not on file  . Number of children: Not on file  . Years of education: Not on file  . Highest education level: Not on file  Occupational History  . Not on file  Social Needs  . Financial resource strain: Not on file  . Food insecurity:    Worry: Not on file    Inability: Not on file  . Transportation needs:    Medical: Not on file    Non-medical: Not on file  Tobacco Use  . Smoking status: Never Smoker  . Smokeless tobacco: Never Used  Substance  and Sexual Activity  . Alcohol use: No  . Drug use: No  . Sexual activity: Not on file  Lifestyle  . Physical activity:    Days per week: Not on file    Minutes per session: Not on file  . Stress: Not on file  Relationships  . Social connections:    Talks on phone: Not on file    Gets together: Not on file    Attends religious service: Not on file    Active member of club or organization: Not on file    Attends meetings of clubs or organizations: Not on file    Relationship status: Not on file  . Intimate partner violence:    Fear of current or ex partner: Not on file    Emotionally abused: Not on file    Physically abused: Not on file    Forced sexual activity: Not on file  Other Topics Concern  . Not on file  Social History Narrative  . Not on file    Family History  Problem Relation Age of Onset  . Cancer  - Prostate Father   . Stomach cancer Father   . Hypertension Other   . Colon cancer Neg Hx   . Esophageal cancer Neg Hx   . Rectal cancer Neg Hx     No Known Allergies  Outpatient Encounter Medications as of 11/02/2017  Medication Sig  . aspirin EC 81 MG tablet Take 81 mg by mouth daily.  . Chlorphen-PE-Acetaminophen (NOREL AD) 4-10-325 MG TABS Take one tablet by mouth every 6 hours as needed for cough/congestion  . furosemide (LASIX) 20 MG tablet TAKE 1 TABLET BY MOUTH EVERY DAY AS NEEDED TO CONTROL EDEMA  . glucose blood (ONE TOUCH ULTRA TEST) test strip TEST 2 TIMES DAILY FOR DIABETES  . Insulin Detemir (LEVEMIR FLEXPEN) 100 UNIT/ML Pen Inject 44 Units into the skin daily at 10 pm.  . Insulin Pen Needle (B-D ULTRAFINE III SHORT PEN) 31G X 8 MM MISC USE FOR INSULIN INJECTION.  Marland Kitchen levothyroxine (SYNTHROID, LEVOTHROID) 125 MCG tablet Take 1 tablet (125 mcg total) by mouth daily.  Marland Kitchen lovastatin (MEVACOR) 40 MG tablet TAKE 1 TABLET DAILY TO CONTROL CHOLESTEROL  . metFORMIN (GLUCOPHAGE) 1000 MG tablet TAKE ONE TABLET BY MOUTH BEFORE BREAKFAST AND ONE BEFORE SUPPER TO CONTROL BLOOD SUGAR  . ONETOUCH DELICA LANCETS FINE MISC Use to check blood sugar twice daily.  . sildenafil (VIAGRA) 100 MG tablet TAKE 1 TABLET BY MOUTH DAILY AS NEEDED FOR ERECTILE DYSFUNCTION  . valsartan-hydrochlorothiazide (DIOVAN HCT) 320-25 MG tablet Take 1 tablet by mouth daily.  . [DISCONTINUED] Blood Glucose Monitoring Suppl (ONETOUCH VERIO FLEX SYSTEM) w/Device KIT 1 kit by Does not apply route daily.   Facility-Administered Encounter Medications as of 11/02/2017  Medication  . 0.9 %  sodium chloride infusion    Review of Systems:  Review of Systems  Constitutional: Positive for unexpected weight change.  Psychiatric/Behavioral: Positive for sleep disturbance (falls asleep easily when he sits in chair; sleep study yrs ago revealed sleep apnea - he lost weight and snoring resolved).  All other systems reviewed  and are negative.   Health Maintenance  Topic Date Due  . PNA vac Low Risk Adult (2 of 2 - PPSV23) 08/30/2017  . INFLUENZA VACCINE  11/15/2017 (Originally 08/16/2017)  . Hepatitis C Screening  01/16/2018 (Originally 09-17-1950)  . HIV Screening  01/16/2018 (Originally 08/31/1967)  . HEMOGLOBIN A1C  03/20/2018  . FOOT EXAM  05/16/2018  . OPHTHALMOLOGY  EXAM  06/29/2018  . TETANUS/TDAP  03/05/2021  . COLONOSCOPY  10/06/2025    Physical Exam: Vitals:   11/02/17 0955  BP: 110/70  Pulse: 68  Temp: 98.3 F (36.8 C)  TempSrc: Oral  SpO2: 99%  Weight: 226 lb (102.5 kg)  Height: 6' (1.829 m)   Body mass index is 30.65 kg/m. Physical Exam  Constitutional: He is oriented to person, place, and time. He appears well-developed and well-nourished.  HENT:  Mouth/Throat: Oropharynx is clear and moist.  MMM; no oral thrush  Eyes: Pupils are equal, round, and reactive to light. No scleral icterus.  Neck: Neck supple. Carotid bruit is not present. No thyromegaly present.  Cardiovascular: Normal rate, regular rhythm and intact distal pulses. Exam reveals no gallop and no friction rub.  Murmur (1/6 SEM) heard. no distal LE swelling. No calf TTP  Pulmonary/Chest: Effort normal and breath sounds normal. He has no wheezes. He has no rales. He exhibits no tenderness.  Abdominal: Soft. Bowel sounds are normal. He exhibits no distension, no abdominal bruit, no pulsatile midline mass and no mass. There is no hepatomegaly. There is no tenderness. There is no rebound and no guarding.  Musculoskeletal: He exhibits edema (small joints).  Lymphadenopathy:    He has no cervical adenopathy.  Neurological: He is alert and oriented to person, place, and time. He has normal reflexes.  Skin: Skin is warm and dry. No rash noted.  Psychiatric: He has a normal mood and affect. His behavior is normal. Judgment and thought content normal.    Labs reviewed: Basic Metabolic Panel: Recent Labs    03/05/17 0921  09/19/17 1045  NA 140 139  K 4.4 4.2  CL 104 100  CO2 32 31  GLUCOSE 137* 114*  BUN 15 17  CREATININE 1.34* 1.29*  CALCIUM 9.7 9.8  TSH 2.34 1.40   Liver Function Tests: Recent Labs    03/05/17 0921 09/19/17 1045  AST 15  --   ALT 12 13  BILITOT 0.4  --   PROT 6.8  --    No results for input(s): LIPASE, AMYLASE in the last 8760 hours. No results for input(s): AMMONIA in the last 8760 hours. CBC: Recent Labs    03/05/17 0921 04/03/17 0855  WBC 3.7* 4.0  NEUTROABS 1,976 2,216  HGB 12.6* 13.2  HCT 37.7* 39.4  MCV 80.7 80.4  PLT 284 267   Lipid Panel: Recent Labs    03/05/17 0921 09/19/17 1045  CHOL 145 150  HDL 56 59  LDLCALC 76 73  TRIG 58 97  CHOLHDL 2.6 2.5   Lab Results  Component Value Date   HGBA1C 8.1 (H) 09/19/2017    Procedures since last visit: No results found.  Assessment/Plan   ICD-10-CM   1. Fall from chair, initial encounter W07.XXXA   2. Excessive daytime sleepiness G47.19 PSG Sleep Study  3. CKD stage 2 due to type 2 diabetes mellitus (HCC) E11.22 CBC with Differential/Platelets   N18.2   4. Weight loss R63.4 CMP with eGFR([facility])    TSH    CBC with Differential/Platelets    PSA  5. Screening for prostate cancer Z12.5 PSA  6. Postoperative hypothyroidism E89.0 TSH   Continue current medications as ordered  Will call with lab results  Will call with sleep study appt  Follow up as scheduled or sooner if need be   [facility] S. Hisako Bugh, [place] and Adult Medicine [address]  [place], [place] [address] [phone] Cell (Monday-Friday 8 AM - 5 PM) [phone] After 5 PM and follow prompts

## 2017-11-05 ENCOUNTER — Telehealth: Payer: Self-pay

## 2017-11-05 ENCOUNTER — Encounter: Payer: Self-pay | Admitting: Internal Medicine

## 2017-11-05 DIAGNOSIS — G4719 Other hypersomnia: Secondary | ICD-10-CM

## 2017-11-05 NOTE — Telephone Encounter (Signed)
Sheena with [facility] returned my call ( I called to confirm that sleep study order was in her workque and order was placed correctly).   Per Vita Barley order will need to be deleted and patient will have to see the sleep specialist first. Per Sheena I need to place referral to sleep study center and select [facility] as location, completed as instructed.   Order placed/cosign required to confirm placed correctly.  Message routed to [place] as a FYI for future reference

## 2017-11-05 NOTE — Telephone Encounter (Signed)
Noted  

## 2018-01-02 ENCOUNTER — Other Ambulatory Visit: Payer: Self-pay | Admitting: *Deleted

## 2018-01-02 DIAGNOSIS — I10 Essential (primary) hypertension: Secondary | ICD-10-CM

## 2018-01-02 MED ORDER — VALSARTAN-HYDROCHLOROTHIAZIDE 320-25 MG PO TABS: 1.0000 | ORAL_TABLET | Freq: Every day | ORAL | 3 refills | Status: DC

## 2018-01-02 NOTE — Telephone Encounter (Signed)
Patient requested refill Faxed to pharmacy.  

## 2018-03-06 ENCOUNTER — Other Ambulatory Visit: Payer: Self-pay | Admitting: *Deleted

## 2018-03-06 NOTE — Telephone Encounter (Signed)
Can the medications just be separated?

## 2018-03-06 NOTE — Telephone Encounter (Signed)
Received fax from [facility] stating that patient needs Refill on his Valsartan/HCTZ 320-25 but Product is on Backorder/Unavailable and needs an alternative. Please Advise.

## 2018-03-07 NOTE — Telephone Encounter (Signed)
We can stop valsartan and start losartan 100 mg by mouth daily with HCTZ. Lets have him follow up in office after he has been taking the new medication for 1 week

## 2018-03-07 NOTE — Telephone Encounter (Signed)
Spoke with Pharmacist at [facility] and called in Losartan 100mg  and HCTZ 25mg .  Medication list updated.   Tried calling patient to let him know and to schedule an appointment. LMOM to return call.

## 2018-03-07 NOTE — Telephone Encounter (Signed)
Spoke with pharmacist and she stated that it can be separated but they do not have Valsartan in stock. Stated that you can change it to the Losartan but all they have of it is the Losartan 100mg . They are out of 25mg  and 50mg . Please Advise.

## 2018-03-08 NOTE — Telephone Encounter (Signed)
Spoke with patient and advised results appt scheduled 

## 2018-03-18 ENCOUNTER — Encounter: Payer: Self-pay | Admitting: Nurse Practitioner

## 2018-03-18 ENCOUNTER — Ambulatory Visit: Payer: BLUE CROSS/BLUE SHIELD | Admitting: Nurse Practitioner

## 2018-03-18 VITALS — BP 120/72 | HR 58 | Temp 98.0°F | Resp 10 | Ht 72.0 in | Wt 219.0 lb

## 2018-03-18 DIAGNOSIS — I10 Essential (primary) hypertension: Secondary | ICD-10-CM | POA: Diagnosis not present

## 2018-03-18 DIAGNOSIS — N182 Chronic kidney disease, stage 2 (mild): Secondary | ICD-10-CM

## 2018-03-18 DIAGNOSIS — Z23 Encounter for immunization: Secondary | ICD-10-CM | POA: Diagnosis not present

## 2018-03-18 DIAGNOSIS — E782 Mixed hyperlipidemia: Secondary | ICD-10-CM

## 2018-03-18 DIAGNOSIS — E1122 Type 2 diabetes mellitus with diabetic chronic kidney disease: Secondary | ICD-10-CM | POA: Diagnosis not present

## 2018-03-18 DIAGNOSIS — E890 Postprocedural hypothyroidism: Secondary | ICD-10-CM

## 2018-03-18 DIAGNOSIS — Z794 Long term (current) use of insulin: Secondary | ICD-10-CM | POA: Diagnosis not present

## 2018-03-18 MED ORDER — ZOSTER VAC RECOMB ADJUVANTED 50 MCG/0.5ML IM SUSR: 0.5000 mL | Freq: Once | INTRAMUSCULAR | 1 refills | Status: AC

## 2018-03-18 NOTE — Patient Instructions (Signed)
Continue diet and exercise modifications

## 2018-03-18 NOTE — Progress Notes (Signed)
Careteam: Patient Care Team: [name], NP as PCP - General (Geriatric Medicine) Leighton Ruff, [place] ([facility])  Advanced Directive information    No Known Allergies  Chief Complaint  Patient presents with  . Medication Management    Discuss B/P medication      HPI: Patient is a 68 y.o. male seen in the office today to discuss blood pressure medication.   Reports he feels well.  Pt previously on valsartan/hctz was on back order and had to be changed.  He was started on losartan 100 mg by mouth daily with HCTZ 25 mg and he is here today to follow up on blood pressure.   DM- does not check blood sugar routinely. He has lost 20 lbs with diet over 2 years with diet  Last blood sugar check was 120  Taking levemir 44 units at bedtime and metformin twice daily   Hyperlipidemia- currently on lovastatin 40 mg daily, diet modifications.   Hypothyroid- synthroid 125 mcg daily  Edema- controlled, takes lasix rarely  Review of Systems:  Review of Systems  Constitutional: Negative for chills, fever and weight loss.  HENT: Negative for hearing loss and tinnitus.   Respiratory: Negative for cough, sputum production and shortness of breath.   Cardiovascular: Negative for chest pain, palpitations and leg swelling.  Gastrointestinal: Negative for abdominal pain, constipation, diarrhea and heartburn.  Genitourinary: Negative for dysuria, frequency and urgency.  Musculoskeletal: Negative for back pain, falls, joint pain and myalgias.  Skin: Negative.   Neurological: Negative for dizziness and headaches.  Psychiatric/Behavioral: Negative for depression and memory loss. The patient does not have insomnia.     Past Medical History:  Diagnosis Date  . Bell's palsy    2005  . Carpal tunnel syndrome   . Cataract   . CKD stage 2 due to type 2 diabetes mellitus ([place]) 12/01/2015  . Diabetes mellitus without complication ([place])   . Edema   . Gout, unspecified   .  Hyperlipidemia   . Hypertension   . Internal hemorrhoids without mention of complication   . Malignant neoplasm of thyroid gland ([place])   . Memory loss   . 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 Obesity, unspecified   . Other malaise and fatigue   . Other psoriasis   . Sleep apnea    no CPAP used 09-23-15  . Supraventricular premature beats   . Thyroid disease   . Trigger finger (acquired)   . Unspecified sleep apnea    Past Surgical History:  Procedure Laterality Date  . CATARACT EXTRACTION W/ INTRAOCULAR LENS IMPLANT Right 06/05/2016   Dr. Arlina Robes  . COLONOSCOPY  10/07/2015   one polyp, Dr. Loletha Carrow  . TOTAL THYROIDECTOMY  2007   Dr. Georgina Quint   Social History:   reports that he has never smoked. He has never used smokeless tobacco. He reports that he does not drink alcohol or use drugs.  Family History  Problem Relation Age of Onset  . Cancer - Prostate Father   . Stomach cancer Father   . Hypertension Other   . Colon cancer Neg Hx   . Esophageal cancer Neg Hx   . Rectal cancer Neg Hx     Medications: Patient's Medications  New Prescriptions   No medications on file  Previous Medications   ASPIRIN EC 81 MG TABLET    Take 81 mg by mouth daily.   FUROSEMIDE (LASIX) 20 MG TABLET    TAKE 1 TABLET BY  MOUTH EVERY DAY AS NEEDED TO CONTROL EDEMA   GLUCOSE BLOOD (ONE TOUCH ULTRA TEST) TEST STRIP    TEST 2 TIMES DAILY FOR DIABETES   HYDROCHLOROTHIAZIDE (HYDRODIURIL) 25 MG TABLET    Take 25 mg by mouth daily.   INSULIN DETEMIR (LEVEMIR FLEXPEN) 100 UNIT/ML PEN    Inject 44 Units into the skin daily at 10 pm.   INSULIN PEN NEEDLE (B-D ULTRAFINE III SHORT PEN) 31G X 8 MM MISC    USE FOR INSULIN INJECTION.   LEVOTHYROXINE (SYNTHROID, LEVOTHROID) 125 MCG TABLET    Take 1 tablet (125 mcg total) by mouth daily.   LOSARTAN (COZAAR) 100 MG TABLET    Take 100 mg by mouth daily.   LOVASTATIN (MEVACOR) 40 MG TABLET    TAKE 1 TABLET DAILY TO CONTROL CHOLESTEROL   METFORMIN (GLUCOPHAGE)  1000 MG TABLET    TAKE ONE TABLET BY MOUTH BEFORE BREAKFAST AND ONE BEFORE SUPPER TO CONTROL BLOOD SUGAR   ONETOUCH DELICA LANCETS FINE MISC    Use to check blood sugar twice daily.   SILDENAFIL (VIAGRA) 100 MG TABLET    TAKE 1 TABLET BY MOUTH DAILY AS NEEDED FOR ERECTILE DYSFUNCTION  Modified Medications   No medications on file  Discontinued Medications   CHLORPHEN-PE-ACETAMINOPHEN (NOREL AD) 4-10-325 MG TABS    Take one tablet by mouth every 6 hours as needed for cough/congestion     Physical Exam:  Vitals:   03/18/18 1448  BP: 120/72  Pulse: (!) 58  Resp: 10  Temp: 98 F (36.7 C)  TempSrc: Oral  SpO2: 98%  Weight: 219 lb (99.3 kg)  Height: 6' (1.829 m)   Body mass index is 29.7 kg/m.  Physical Exam Constitutional:      General: He is not in acute distress.    Appearance: Normal appearance. He is well-developed.  Eyes:     General: No scleral icterus.    Pupils: Pupils are equal, round, and reactive to light.  Neck:     Musculoskeletal: Neck supple.     Thyroid: No thyromegaly.     Vascular: No carotid bruit.  Cardiovascular:     Rate and Rhythm: Normal rate and regular rhythm.     Heart sounds: Murmur (1/6 SEM) present. No friction rub. No gallop.      Comments: no distal LE swelling. No calf TTP Pulmonary:     Effort: Pulmonary effort is normal.     Breath sounds: Normal breath sounds. No wheezing or rales.  Chest:     Chest wall: No tenderness.  Abdominal:     General: Bowel sounds are normal. There is no distension or abdominal bruit.     Palpations: Abdomen is soft. There is no hepatomegaly, mass or pulsatile mass.     Tenderness: There is no abdominal tenderness. There is no guarding or rebound.  Lymphadenopathy:     Cervical: No cervical adenopathy.  Skin:    General: Skin is warm and dry.     Findings: No rash.  Neurological:     Mental Status: He is alert and oriented to person, place, and time.     Deep Tendon Reflexes: Reflexes are normal and  symmetric.  Psychiatric:        Behavior: Behavior normal.        Thought Content: Thought content normal.        Judgment: Judgment normal.     Labs reviewed: Basic Metabolic Panel: Recent Labs    09/19/17 1045 11/02/17 1122  NA 139 141  K 4.2 4.4  CL 100 103  CO2 31 30  GLUCOSE 114* 119*  BUN 17 19  CREATININE 1.29* 1.35*  CALCIUM 9.8 9.8  TSH 1.40 0.48   Liver Function Tests: Recent Labs    09/19/17 1045 11/02/17 1122  AST  --  18  ALT 13 12  BILITOT  --  0.4  PROT  --  7.1   No results for input(s): LIPASE, AMYLASE in the last 8760 hours. No results for input(s): AMMONIA in the last 8760 hours. CBC: Recent Labs    04/03/17 0855 11/02/17 1122  WBC 4.0 3.6*  NEUTROABS 2,216 1,886  HGB 13.2 13.4  HCT 39.4 40.3  MCV 80.4 81.1  PLT 267 255   Lipid Panel: Recent Labs    09/19/17 1045  CHOL 150  HDL 59  LDLCALC 73  TRIG 97  CHOLHDL 2.5   TSH: Recent Labs    09/19/17 1045 11/02/17 1122  TSH 1.40 0.48   A1C: Lab Results  Component Value Date   HGBA1C 8.1 (H) 09/19/2017     Assessment/Plan 1. CKD stage 2 due to type 2 diabetes mellitus (HCC) - COMPLETE METABOLIC PANEL WITH GFR - CBC with Differential/Platelet  2. Postoperative hypothyroidism -continues on levothroid 125 mcg daily  - TSH  3. Mixed hyperlipidemia -continues on lovastatin with dietary modifications.  - COMPLETE METABOLIC PANEL WITH GFR - Lipid Panel  4. Type 2 diabetes mellitus with stage 2 chronic kidney disease, with long-term current use of insulin (HCC) -A1c not at goal but he has made significant dietary modifications.  -cont current lifestyle change and medication - Hemoglobin A1c - CBC with Differential/Platelet - Pneumococcal conjugate vaccine 13-valent  5. Essential hypertension -controlled on losartan and hctz - COMPLETE METABOLIC PANEL WITH GFR - CBC with Differential/Platelet  6. Need for vaccination with 13-polyvalent pneumococcal conjugate  vaccine - Pneumococcal conjugate vaccine 13-valent  Next appt: 6 months, sooner if needed  Keiera Strathman K. [place], [facility] [phone]

## 2018-03-19 ENCOUNTER — Telehealth: Payer: Self-pay

## 2018-03-19 DIAGNOSIS — R7989 Other specified abnormal findings of blood chemistry: Secondary | ICD-10-CM

## 2018-03-19 LAB — LIPID PANEL
Cholesterol: 147 mg/dL (ref <200)
HDL: 60 mg/dL (ref >=40)
LDL Cholesterol (Calc): 73 mg/dL (calc)
Non-HDL Cholesterol (Calc): 87 mg/dL (calc) (ref <130)
Total CHOL/HDL Ratio: 2.5 (calc) (ref <5.0)
Triglycerides: 61 mg/dL (ref <150)

## 2018-03-19 LAB — COMPLETE METABOLIC PANEL WITH GFR
AG RATIO: 1.3 (calc) (ref 1.0–2.5)
ALT: 11 U/L (ref 9–46)
AST: 16 U/L (ref 10–35)
Albumin: 4 g/dL (ref 3.6–5.1)
Alkaline phosphatase (APISO): 55 U/L (ref 35–144)
BUN/Creatinine Ratio: 13 (calc) (ref 6–22)
BUN: 18 mg/dL (ref 7–25)
CHLORIDE: 100 mmol/L (ref 98–110)
CO2: 30 mmol/L (ref 20–32)
Calcium: 9.9 mg/dL (ref 8.6–10.3)
Creat: 1.34 mg/dL — ABNORMAL HIGH (ref 0.70–1.25)
GFR, Est African American: 64 mL/min/{1.73_m2} (ref >=60)
GFR, Est Non African American: 55 mL/min/{1.73_m2} — ABNORMAL LOW (ref >=60)
Globulin: 3.1 g/dL (calc) (ref 1.9–3.7)
Glucose, Bld: 75 mg/dL (ref 65–139)
POTASSIUM: 3.7 mmol/L (ref 3.5–5.3)
Sodium: 137 mmol/L (ref 135–146)
Total Bilirubin: 0.5 mg/dL (ref 0.2–1.2)
Total Protein: 7.1 g/dL (ref 6.1–8.1)

## 2018-03-19 LAB — CBC WITH DIFFERENTIAL/PLATELET
ABSOLUTE MONOCYTES: 355 {cells}/uL (ref 200–950)
Basophils Absolute: 31 cells/uL (ref 0–200)
Basophils Relative: 0.8 %
Eosinophils Absolute: 101 cells/uL (ref 15–500)
Eosinophils Relative: 2.6 %
HCT: 37.7 % — ABNORMAL LOW (ref 38.5–50.0)
Hemoglobin: 12.7 g/dL — ABNORMAL LOW (ref 13.2–17.1)
Lymphs Abs: 1416 cells/uL (ref 850–3900)
MCH: 27.1 pg (ref 27.0–33.0)
MCHC: 33.7 g/dL (ref 32.0–36.0)
MCV: 80.6 fL (ref 80.0–100.0)
MPV: 9.8 fL (ref 7.5–12.5)
Monocytes Relative: 9.1 %
Neutro Abs: 1997 cells/uL (ref 1500–7800)
Neutrophils Relative %: 51.2 %
Platelets: 267 10*3/uL (ref 140–400)
RBC: 4.68 10*6/uL (ref 4.20–5.80)
RDW: 13.5 % (ref 11.0–15.0)
Total Lymphocyte: 36.3 %
WBC: 3.9 10*3/uL (ref 3.8–10.8)

## 2018-03-19 LAB — TSH: TSH: 0.25 mIU/L — ABNORMAL LOW (ref 0.40–4.50)

## 2018-03-19 LAB — HEMOGLOBIN A1C
Hgb A1c MFr Bld: 6.8 % of total Hgb — ABNORMAL HIGH (ref <5.7)
MEAN PLASMA GLUCOSE: 148 (calc)
eAG (mmol/L): 8.2 (calc)

## 2018-03-19 MED ORDER — LEVOTHYROXINE SODIUM 112 MCG PO TABS: 112.0000 ug | ORAL_TABLET | Freq: Every day | ORAL | 1 refills | Status: DC

## 2018-03-19 NOTE — Telephone Encounter (Signed)
Spoke with patient and discussed lab results and medication changes.Patient denied having any bleeding or dark stools. Updated patient's medication list, Refilled patient medication and sent to pharmacy, scheduled labs, and placed future lab orders. Patient verbalized understanding and denied further questions. Refer to result note 03/19/2018

## 2018-03-19 NOTE — Telephone Encounter (Signed)
-----   Message from [name], NP sent at 03/19/2018  7:28 AM EST ----- A1c is [facility] BETTER!!! You are at goal at 6.8! Keep up the great work! Cholesterol looks good too! Kidney function stable. Electrolytes and liver function look good. hgb slightly low from 4 months ago. Any signs of bleeding? Dark stools? Also TSH is low therefore you are getting too much medication. Need to reduce synthroid to 112 mcg from 125 mcg and follow up TSH in 8 weeks to make sure we are in normal range.

## 2018-04-01 ENCOUNTER — Other Ambulatory Visit: Payer: Self-pay | Admitting: *Deleted

## 2018-04-01 MED ORDER — SILDENAFIL CITRATE 100 MG PO TABS: ORAL_TABLET | ORAL | 0 refills | Status: DC

## 2018-04-09 ENCOUNTER — Other Ambulatory Visit: Payer: Self-pay

## 2018-04-09 MED ORDER — LOSARTAN POTASSIUM-HCTZ 100-25 MG PO TABS: 1.0000 | ORAL_TABLET | Freq: Every day | ORAL | 1 refills | Status: DC

## 2018-04-09 MED ORDER — LOSARTAN POTASSIUM 100 MG PO TABS: 100.0000 mg | ORAL_TABLET | Freq: Every day | ORAL | 1 refills | Status: DC

## 2018-05-14 ENCOUNTER — Other Ambulatory Visit: Payer: BLUE CROSS/BLUE SHIELD

## 2018-05-14 ENCOUNTER — Other Ambulatory Visit: Payer: Self-pay

## 2018-05-14 DIAGNOSIS — R7989 Other specified abnormal findings of blood chemistry: Secondary | ICD-10-CM

## 2018-05-14 LAB — TSH: TSH: 0.94 mIU/L (ref 0.40–4.50)

## 2018-05-15 ENCOUNTER — Ambulatory Visit (INDEPENDENT_AMBULATORY_CARE_PROVIDER_SITE_OTHER): Payer: BLUE CROSS/BLUE SHIELD | Admitting: Family

## 2018-05-15 ENCOUNTER — Encounter: Payer: Self-pay | Admitting: Family

## 2018-05-15 DIAGNOSIS — M549 Dorsalgia, unspecified: Secondary | ICD-10-CM | POA: Diagnosis not present

## 2018-05-15 NOTE — Progress Notes (Signed)
This service is provided via telemedicine  No vital signs collected/recorded due to the encounter was a telemedicine visit.   Location of patient (ex: home, work):  Home   Patient consents to a telephone visit:  Yes  Location of the provider (ex: office, home):  Office   Name of any referring provider:  Sherrie Mustache, NP  Names of all persons participating in the telemedicine service and their role in the encounter:  Ruthell Rummage CMA, Zayna Toste NP, Vella Redhead  Time spent on call:  Ruthell Rummage CMA spent 6   Minutes on phone with patient.     [facility]  Provider: Marlowe Sax, NP  Code Status: FULL Goals of Care:  Advanced Directives 09/25/2017  Does Patient Have a Medical Advance Directive? No  Would patient like information on creating a medical advance directive? -     Chief Complaint  Patient presents with  . Acute Visit    Left Side pain    HPI: Patient is a 68 y.o. male seen today for an acute visit for left side upper back pain x 2 days.He describes pain as sharp.Took  2 tablets tablets of ibuprofen on the 1st day and one tablet on 2nd day.Pain was better with two tablet of ibuprofen.Pain is worst with bending over.he denies any fever,chills,cough,shortness of breath or chest pain.No injuries to area.He has had no signs of urinary tract infections.He states does not check his blood sugars on a regularly basis last checked one week ago CBG was 115.  Past Medical History:  Diagnosis Date  . Bell's palsy    2005  . Carpal tunnel syndrome   . Cataract   . CKD stage 2 due to type 2 diabetes mellitus ([place]) 12/01/2015  . Diabetes mellitus without complication ([place])   . Edema   . Gout, unspecified   . Hyperlipidemia   . Hypertension   . Internal hemorrhoids without mention of complication   . Malignant neoplasm of thyroid gland ([place])   . Memory loss   . 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 Obesity, unspecified   . Other malaise and fatigue    . Other psoriasis   . Sleep apnea    no CPAP used 09-23-15  . Supraventricular premature beats   . Thyroid disease   . Trigger finger (acquired)   . Unspecified sleep apnea     Past Surgical History:  Procedure Laterality Date  . CATARACT EXTRACTION W/ INTRAOCULAR LENS IMPLANT Right 06/05/2016   Dr. Arlina Robes  . COLONOSCOPY  10/07/2015   one polyp, Dr. Loletha Carrow  . TOTAL THYROIDECTOMY  2007   Dr. Georgina Quint    No Known Allergies  Outpatient Encounter Medications as of 05/15/2018  Medication Sig  . aspirin EC 81 MG tablet Take 81 mg by mouth daily.  Marland Kitchen glucose blood (ONE TOUCH ULTRA TEST) test strip TEST 2 TIMES DAILY FOR DIABETES  . hydrochlorothiazide (HYDRODIURIL) 25 MG tablet Take 25 mg by mouth daily.  . Insulin Detemir (LEVEMIR FLEXPEN) 100 UNIT/ML Pen Inject 44 Units into the skin daily at 10 pm.  . Insulin Pen Needle (B-D ULTRAFINE III SHORT PEN) 31G X 8 MM MISC USE FOR INSULIN INJECTION.  Marland Kitchen levothyroxine (SYNTHROID, LEVOTHROID) 112 MCG tablet Take 1 tablet (112 mcg total) by mouth daily before breakfast.  . losartan (COZAAR) 100 MG tablet Take 1 tablet (100 mg total) by mouth daily.  Marland Kitchen lovastatin (MEVACOR) 40 MG tablet TAKE 1 TABLET DAILY TO CONTROL CHOLESTEROL  .  metFORMIN (GLUCOPHAGE) 1000 MG tablet TAKE ONE TABLET BY MOUTH BEFORE BREAKFAST AND ONE BEFORE SUPPER TO CONTROL BLOOD SUGAR  . ONETOUCH DELICA LANCETS FINE MISC Use to check blood sugar twice daily.  . sildenafil (VIAGRA) 100 MG tablet TAKE 1 TABLET BY MOUTH DAILY AS NEEDED FOR ERECTILE DYSFUNCTION   No facility-administered encounter medications on file as of 05/15/2018.     Review of Systems:  Review of Systems  Constitutional: Negative for appetite change, chills, fatigue and fever.  HENT: Negative for congestion, rhinorrhea, sinus pressure, sinus pain, sneezing and sore throat.   Respiratory: Negative for cough, chest tightness, shortness of breath and wheezing.   Cardiovascular: Negative for chest pain,  palpitations and leg swelling.  Gastrointestinal: Negative for abdominal distention, abdominal pain, constipation, diarrhea, nausea and vomiting.  Genitourinary: Negative for difficulty urinating, dysuria, flank pain, frequency, hematuria and urgency.  Musculoskeletal:       Left side upper back sharp pain x 2 days.Took ibuprofen 2 tablets 1st day and one tablet on 2nd day.Pain was better with two tablet of ibuprofen.   Skin: Negative for color change, pallor and rash.  Neurological: Negative for dizziness, light-headedness and headaches.    Health Maintenance  Topic Date Due  . Hepatitis C Screening  10-24-1950  . HIV Screening  08/31/1967  . FOOT EXAM  05/16/2018  . OPHTHALMOLOGY EXAM  06/29/2018  . INFLUENZA VACCINE  08/17/2018  . HEMOGLOBIN A1C  09/18/2018  . PNA vac Low Risk Adult (2 of 2 - PPSV23) 03/18/2019  . TETANUS/TDAP  03/05/2021  . COLONOSCOPY  10/06/2025    Physical Exam: There were no vitals filed for this visit. There is no height or weight on file to calculate BMI. Physical Exam Unable to assess on telephone visit.   Labs reviewed: Basic Metabolic Panel: Recent Labs    09/19/17 1045 11/02/17 1122 03/18/18 1527 05/14/18 0837  NA 139 141 137  --   K 4.2 4.4 3.7  --   CL 100 103 100  --   CO2 31 30 30   --   GLUCOSE 114* 119* 75  --   BUN 17 19 18   --   CREATININE 1.29* 1.35* 1.34*  --   CALCIUM 9.8 9.8 9.9  --   TSH 1.40 0.48 0.25* 0.94   Liver Function Tests: Recent Labs    09/19/17 1045 11/02/17 1122 03/18/18 1527  AST  --  18 16  ALT 13 12 11   BILITOT  --  0.4 0.5  PROT  --  7.1 7.1   CBC: Recent Labs    11/02/17 1122 03/18/18 1527  WBC 3.6* 3.9  NEUTROABS 1,886 1,997  HGB 13.4 12.7*  HCT 40.3 37.7*  MCV 81.1 80.6  PLT 255 267   Lipid Panel: Recent Labs    09/19/17 1045 03/18/18 1527  CHOL 150 147  HDL 59 60  LDLCALC 73 73  TRIG 97 61  CHOLHDL 2.5 2.5   Lab Results  Component Value Date   HGBA1C 6.8 (H) 03/18/2018     Procedures since last visit: No results found.  Assessment/Plan   Upper back pain on left side Unclear etiology.No signs of fever,chills or urinary tract infections.pain responded well to ibuprofen suspect possible musculoskeletal origin pain.encouraged to continue with ibuprofen or take extra strength tylenol 500 mg tablet one every 6 hours as needed for pain.Notify provider or go to ED if running any fever > 100.5 ,cough or shortness of breath.Declined imaging for now.  Labs/tests ordered: none  Next appt: PRN

## 2018-05-17 ENCOUNTER — Other Ambulatory Visit: Payer: Self-pay | Admitting: Nurse Practitioner

## 2018-06-06 ENCOUNTER — Other Ambulatory Visit: Payer: Self-pay | Admitting: *Deleted

## 2018-06-06 MED ORDER — INSULIN PEN NEEDLE 31G X 8 MM MISC: 3 refills | Status: AC

## 2018-06-06 NOTE — Telephone Encounter (Signed)
[facility] 

## 2018-06-12 ENCOUNTER — Other Ambulatory Visit: Payer: Self-pay | Admitting: Nurse Practitioner

## 2018-06-14 ENCOUNTER — Other Ambulatory Visit: Payer: Self-pay | Admitting: Nurse Practitioner

## 2018-06-21 ENCOUNTER — Other Ambulatory Visit: Payer: Self-pay

## 2018-06-21 ENCOUNTER — Encounter: Payer: Self-pay | Admitting: Nurse Practitioner

## 2018-06-21 ENCOUNTER — Ambulatory Visit (INDEPENDENT_AMBULATORY_CARE_PROVIDER_SITE_OTHER): Payer: BC Managed Care – PPO | Admitting: Nurse Practitioner

## 2018-06-21 VITALS — BP 132/80 | HR 65 | Temp 98.7°F | Ht 72.0 in | Wt 218.0 lb

## 2018-06-21 DIAGNOSIS — I872 Venous insufficiency (chronic) (peripheral): Secondary | ICD-10-CM

## 2018-06-21 DIAGNOSIS — E782 Mixed hyperlipidemia: Secondary | ICD-10-CM

## 2018-06-21 DIAGNOSIS — I10 Essential (primary) hypertension: Secondary | ICD-10-CM

## 2018-06-21 DIAGNOSIS — N182 Chronic kidney disease, stage 2 (mild): Secondary | ICD-10-CM

## 2018-06-21 DIAGNOSIS — Z794 Long term (current) use of insulin: Secondary | ICD-10-CM

## 2018-06-21 DIAGNOSIS — E1122 Type 2 diabetes mellitus with diabetic chronic kidney disease: Secondary | ICD-10-CM

## 2018-06-21 LAB — BASIC METABOLIC PANEL WITH GFR
BUN/Creatinine Ratio: 13 (calc) (ref 6–22)
BUN: 18 mg/dL (ref 7–25)
CO2: 29 mmol/L (ref 20–32)
Calcium: 9.2 mg/dL (ref 8.6–10.3)
Chloride: 104 mmol/L (ref 98–110)
Creat: 1.38 mg/dL — ABNORMAL HIGH (ref 0.70–1.25)
GFR, Est African American: 62 mL/min/{1.73_m2} (ref >=60)
GFR, Est Non African American: 53 mL/min/{1.73_m2} — ABNORMAL LOW (ref >=60)
Glucose, Bld: 114 mg/dL — ABNORMAL HIGH (ref 65–99)
Potassium: 4.1 mmol/L (ref 3.5–5.3)
Sodium: 140 mmol/L (ref 135–146)

## 2018-06-21 MED ORDER — GLUCOSE BLOOD VI STRP: ORAL_STRIP | 11 refills | Status: DC

## 2018-06-21 NOTE — Progress Notes (Signed)
Careteam: Patient Care Team: [name], NP as PCP - General (Geriatric Medicine) Leighton Ruff, [place] ([facility])  Advanced Directive information    No Known Allergies  Chief Complaint  Patient presents with  . Acute Visit    Left ankle swelling x couple weeks. No known injury.      HPI: Patient is a 68 y.o. male seen in the office today due to ankle swelling for 3 weeks.  When he wakes up in the morning there is no swelling, but then when he is up for a little while he gets swelling.  Walks 2 miles every morning and no problem with this.  No pain or tenderness.  No injury or fall.  Taking lasix every day 2 weeks ago for swelling  Eating bacon daily for breakfast. Eats bacon egg and cheese sanwhich.  Frosted flakes  [place] and [place] food for dinner  Review of Systems:  Review of Systems  Constitutional: Negative for chills, fever and weight loss.  HENT: Negative for hearing loss and tinnitus.   Respiratory: Negative for cough, sputum production and shortness of breath.   Cardiovascular: Positive for leg swelling (ankle). Negative for chest pain and palpitations.  Gastrointestinal: Negative for abdominal pain, constipation, diarrhea and heartburn.  Genitourinary: Negative for dysuria, frequency and urgency.  Musculoskeletal: Negative for back pain, falls, joint pain and myalgias.  Skin: Negative.     Past Medical History:  Diagnosis Date  . Bell's palsy    2005  . Carpal tunnel syndrome   . Cataract   . CKD stage 2 due to type 2 diabetes mellitus ([place]) 12/01/2015  . Diabetes mellitus without complication ([place])   . Edema   . Gout, unspecified   . Hyperlipidemia   . Hypertension   . Internal hemorrhoids without mention of complication   . Malignant neoplasm of thyroid gland ([place])   . Memory loss   . 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 Obesity, unspecified   . Other malaise and fatigue   . Other psoriasis   . Sleep apnea    no CPAP used  09-23-15  . Supraventricular premature beats   . Thyroid disease   . Trigger finger (acquired)   . Unspecified sleep apnea    Past Surgical History:  Procedure Laterality Date  . CATARACT EXTRACTION W/ INTRAOCULAR LENS IMPLANT Right 06/05/2016   Dr. Arlina Robes  . COLONOSCOPY  10/07/2015   one polyp, Dr. Loletha Carrow  . TOTAL THYROIDECTOMY  2007   Dr. Georgina Quint   Social History:   reports that he has never smoked. He has never used smokeless tobacco. He reports that he does not drink alcohol or use drugs.  Family History  Problem Relation Age of Onset  . Cancer - Prostate Father   . Stomach cancer Father   . Hypertension Other   . Colon cancer Neg Hx   . Esophageal cancer Neg Hx   . Rectal cancer Neg Hx     Medications: Patient's Medications  New Prescriptions   No medications on file  Previous Medications   ASPIRIN EC 81 MG TABLET    Take 81 mg by mouth daily.   CYANOCOBALAMIN (B-12) 1000 MCG TABS    Take by mouth daily.   GLUCOSE BLOOD (ONE TOUCH ULTRA TEST) TEST STRIP    TEST 2 TIMES DAILY FOR DIABETES   HYDROCHLOROTHIAZIDE (HYDRODIURIL) 25 MG TABLET    Take 25 mg by mouth daily.   INSULIN DETEMIR (LEVEMIR FLEXPEN) 100  UNIT/ML PEN    Inject 44 Units into the skin daily at 10 pm.   INSULIN PEN NEEDLE (B-D ULTRAFINE III SHORT PEN) 31G X 8 MM MISC    Use for insulin injections. Dx: E11.22   LEVOTHYROXINE (SYNTHROID) 112 MCG TABLET    TAKE 1 TABLET (112 MCG TOTAL) BY MOUTH DAILY BEFORE BREAKFAST.   LOSARTAN (COZAAR) 100 MG TABLET    Take 1 tablet (100 mg total) by mouth daily.   LOVASTATIN (MEVACOR) 40 MG TABLET    TAKE 1 TABLET DAILY TO CONTROL CHOLESTEROL   METFORMIN (GLUCOPHAGE) 1000 MG TABLET    TAKE ONE TABLET BY MOUTH BEFORE BREAKFAST AND ONE BEFORE SUPPER TO CONTROL BLOOD SUGAR   ONETOUCH DELICA LANCETS FINE MISC    Use to check blood sugar twice daily.   SILDENAFIL (VIAGRA) 100 MG TABLET    TAKE ONE TABLET BY MOUTH DAILY AS NEEDED FOR ERECTILE DYSFUNCTION   VITAMIN C  (ASCORBIC ACID) 250 MG TABLET    Take 500 mg by mouth daily.  Modified Medications   No medications on file  Discontinued Medications   No medications on file    Physical Exam:  Vitals:   06/21/18 0938  BP: 132/80  Pulse: 65  Temp: 98.7 F (37.1 C)  TempSrc: Oral  SpO2: 98%  Weight: 218 lb (98.9 kg)  Height: 6' (1.829 m)   Body mass index is 29.57 kg/m. Wt Readings from Last 3 Encounters:  06/21/18 218 lb (98.9 kg)  03/18/18 219 lb (99.3 kg)  11/02/17 226 lb (102.5 kg)    Physical Exam Constitutional:      General: He is not in acute distress.    Appearance: Normal appearance. He is well-developed.  Eyes:     General: No scleral icterus.    Pupils: Pupils are equal, round, and reactive to light.  Neck:     Musculoskeletal: Neck supple.     Thyroid: No thyromegaly.     Vascular: No carotid bruit.  Cardiovascular:     Rate and Rhythm: Normal rate and regular rhythm.     Heart sounds: Murmur (1/6 SEM) present. No friction rub. No gallop.      Comments: no distal LE swelling. No calf TTP Pulmonary:     Effort: Pulmonary effort is normal.     Breath sounds: Normal breath sounds. No wheezing or rales.  Chest:     Chest wall: No tenderness.  Abdominal:     General: There is no distension or abdominal bruit.     Palpations: There is no hepatomegaly, mass or pulsatile mass.     Tenderness: There is no abdominal tenderness.  Musculoskeletal:     Right lower leg: Edema (trace) present.     Left lower leg: Edema (trace) present.     Comments: Noted with 1+ left food and ankle swelling, no tenderness, heat or decrease ROM  Lymphadenopathy:     Cervical: No cervical adenopathy.  Skin:    General: Skin is warm and dry.     Findings: No rash.  Neurological:     Mental Status: He is alert and oriented to person, place, and time.     Deep Tendon Reflexes: Reflexes are normal and symmetric.  Psychiatric:        Behavior: Behavior normal.        Thought Content: Thought  content normal.        Judgment: Judgment normal.     Labs reviewed: Basic Metabolic Panel: Recent Labs  09/19/17 1045 11/02/17 1122 03/18/18 1527 05/14/18 0837  NA 139 141 137  --   K 4.2 4.4 3.7  --   CL 100 103 100  --   CO2 '31 30 30  '$ --   GLUCOSE 114* 119* 75  --   BUN '17 19 18  '$ --   CREATININE 1.29* 1.35* 1.34*  --   CALCIUM 9.8 9.8 9.9  --   TSH 1.40 0.48 0.25* 0.94   Liver Function Tests: Recent Labs    09/19/17 1045 11/02/17 1122 03/18/18 1527  AST  --  18 16  ALT '13 12 11  '$ BILITOT  --  0.4 0.5  PROT  --  7.1 7.1   No results for input(s): LIPASE, AMYLASE in the last 8760 hours. No results for input(s): AMMONIA in the last 8760 hours. CBC: Recent Labs    11/02/17 1122 03/18/18 1527  WBC 3.6* 3.9  NEUTROABS 1,886 1,997  HGB 13.4 12.7*  HCT 40.3 37.7*  MCV 81.1 80.6  PLT 255 267   Lipid Panel: Recent Labs    09/19/17 1045 03/18/18 1527  CHOL 150 147  HDL 59 60  LDLCALC 73 73  TRIG 97 61  CHOLHDL 2.5 2.5   TSH: Recent Labs    11/02/17 1122 03/18/18 1527 05/14/18 0837  TSH 0.48 0.25* 0.94   A1C: Lab Results  Component Value Date   HGBA1C 6.8 (H) 03/18/2018     Assessment/Plan 1. Edema of left lower extremity due to peripheral venous insufficiency -eating more foods higher in sodium which has likely contributed to edema. He has been taking lasix daily, will follow up BMP today to evaluate kidneys and electrolytes.  Encouraged compression hose/socks and elevation of legs as tolerates throughout the day as well.  - BMP with eGFR([facility]) - Amb ref to Medical Nutrition Therapy-MNT  2. Type 2 diabetes mellitus with stage 2 chronic kidney disease, with long-term current use of insulin (HCC) a1c at 6.8 previously 8.1, encouraged dietary compliance, will refer to nutrition for further education.  - glucose blood (ONE TOUCH ULTRA TEST) test strip; TEST 2 TIMES DAILY FOR DIABETES  Dispense: 100 each; Refill: 11 - Amb ref to Medical  Nutrition Therapy-MNT  3. Mixed hyperlipidemia Continues on lovastatin, diet not consistent with heart healthy, referral placed for further education and ideas for better dietary compliance.  - Amb ref to Medical Nutrition Therapy-MNT  4. Essential hypertension -stable in office today, continues on losartan 100 mg and hctz 25 mg daily  - Amb ref to Medical Nutrition Therapy-MNT  Next appt: 3 months follow up, labs prior to visit.  Carlos American. [place], [facility] [phone]

## 2018-06-21 NOTE — Patient Instructions (Signed)
Knee high compression hose or socks Elevate legs above level of heart as much as you can, take an hour in the afternoon to do this Decrease sodium in diet.    DASH Eating Plan DASH stands for "Dietary Approaches to Stop Hypertension." The DASH eating plan is a healthy eating plan that has been shown to reduce high blood pressure (hypertension). It may also reduce your risk for type 2 diabetes, heart disease, and stroke. The DASH eating plan may also help with weight loss. What are tips for following this plan?  General guidelines  Avoid eating more than 2,300 mg (milligrams) of salt (sodium) a day. If you have hypertension, you may need to reduce your sodium intake to 1,500 mg a day.  Limit alcohol intake to no more than 1 drink a day for nonpregnant women and 2 drinks a day for men. One drink equals 12 oz of beer, 5 oz of wine, or 1 oz of hard liquor.  Work with your health care provider to maintain a healthy body weight or to lose weight. Ask what an ideal weight is for you.  Get at least 30 minutes of exercise that causes your heart to beat faster (aerobic exercise) most days of the week. Activities may include walking, swimming, or biking.  Work with your health care provider or diet and nutrition specialist (dietitian) to adjust your eating plan to your individual calorie needs. Reading food labels   Check food labels for the amount of sodium per serving. Choose foods with less than 5 percent of the Daily Value of sodium. Generally, foods with less than 300 mg of sodium per serving fit into this eating plan.  To find whole grains, look for the word "whole" as the first word in the ingredient list. Shopping  Buy products labeled as "low-sodium" or "no salt added."  Buy fresh foods. Avoid canned foods and premade or frozen meals. Cooking  Avoid adding salt when cooking. Use salt-free seasonings or herbs instead of table salt or sea salt. Check with your health care provider or  pharmacist before using salt substitutes.  Do not fry foods. Cook foods using healthy methods such as baking, boiling, grilling, and broiling instead.  Cook with heart-healthy oils, such as olive, canola, soybean, or sunflower oil. Meal planning  Eat a balanced diet that includes: ? 5 or more servings of fruits and vegetables each day. At each meal, try to fill half of your plate with fruits and vegetables. ? Up to 6-8 servings of whole grains each day. ? Less than 6 oz of lean meat, poultry, or fish each day. A 3-oz serving of meat is about the same size as a deck of cards. One egg equals 1 oz. ? 2 servings of low-fat dairy each day. ? A serving of nuts, seeds, or beans 5 times each week. ? Heart-healthy fats. Healthy fats called Omega-3 fatty acids are found in foods such as flaxseeds and coldwater fish, like sardines, salmon, and mackerel.  Limit how much you eat of the following: ? Canned or prepackaged foods. ? Food that is high in trans fat, such as fried foods. ? Food that is high in saturated fat, such as fatty meat. ? Sweets, desserts, sugary drinks, and other foods with added sugar. ? Full-fat dairy products.  Do not salt foods before eating.  Try to eat at least 2 vegetarian meals each week.  Eat more home-cooked food and less restaurant, buffet, and fast food.  When eating at a  restaurant, ask that your food be prepared with less salt or no salt, if possible. What foods are recommended? The items listed may not be a complete list. Talk with your dietitian about what dietary choices are best for you. Grains Whole-grain or whole-wheat bread. Whole-grain or whole-wheat pasta. Brown rice. [place]. Bulgur. Whole-grain and low-sodium cereals. Pita bread. Low-fat, low-sodium crackers. Whole-wheat flour tortillas. Vegetables Fresh or frozen vegetables (raw, steamed, roasted, or grilled). Low-sodium or reduced-sodium tomato and vegetable juice. Low-sodium or  reduced-sodium tomato sauce and tomato paste. Low-sodium or reduced-sodium canned vegetables. Fruits All fresh, dried, or frozen fruit. Canned fruit in natural juice (without added sugar). Meat and other protein foods Skinless chicken or [place]. Ground chicken or [place]. Pork with fat trimmed off. Fish and seafood. Egg whites. Dried beans, peas, or lentils. Unsalted nuts, nut butters, and seeds. Unsalted canned beans. Lean cuts of beef with fat trimmed off. Low-sodium, lean deli meat. Dairy Low-fat (1%) or fat-free (skim) milk. Fat-free, low-fat, or reduced-fat cheeses. Nonfat, low-sodium ricotta or cottage cheese. Low-fat or nonfat yogurt. Low-fat, low-sodium cheese. Fats and oils Soft margarine without trans fats. Vegetable oil. Low-fat, reduced-fat, or light mayonnaise and salad dressings (reduced-sodium). Canola, safflower, olive, soybean, and sunflower oils. Avocado. Seasoning and other foods Herbs. Spices. Seasoning mixes without salt. Unsalted popcorn and pretzels. Fat-free sweets. What foods are not recommended? The items listed may not be a complete list. Talk with your dietitian about what dietary choices are best for you. Grains Baked goods made with fat, such as croissants, muffins, or some breads. Dry pasta or rice meal packs. Vegetables Creamed or fried vegetables. Vegetables in a cheese sauce. Regular canned vegetables (not low-sodium or reduced-sodium). Regular canned tomato sauce and paste (not low-sodium or reduced-sodium). Regular tomato and vegetable juice (not low-sodium or reduced-sodium). Angie Fava. Olives. Fruits Canned fruit in a light or heavy syrup. Fried fruit. Fruit in cream or butter sauce. Meat and other protein foods Fatty cuts of meat. Ribs. Fried meat. Berniece Salines. Sausage. Bologna and other processed lunch meats. Salami. Fatback. Hotdogs. Bratwurst. Salted nuts and seeds. Canned beans with added salt. Canned or smoked fish. Whole eggs or egg yolks. Chicken or [place]  with skin. Dairy Whole or 2% milk, cream, and half-and-half. Whole or full-fat cream cheese. Whole-fat or sweetened yogurt. Full-fat cheese. Nondairy creamers. Whipped toppings. Processed cheese and cheese spreads. Fats and oils Butter. Stick margarine. Lard. Shortening. Ghee. Bacon fat. Tropical oils, such as coconut, palm kernel, or palm oil. Seasoning and other foods Salted popcorn and pretzels. Onion salt, garlic salt, seasoned salt, table salt, and sea salt. Worcestershire sauce. Tartar sauce. Barbecue sauce. Teriyaki sauce. Soy sauce, including reduced-sodium. Steak sauce. Canned and packaged gravies. Fish sauce. Oyster sauce. Cocktail sauce. Horseradish that you find on the shelf. Ketchup. Mustard. Meat flavorings and tenderizers. Bouillon cubes. Hot sauce and Tabasco sauce. Premade or packaged marinades. Premade or packaged taco seasonings. Relishes. Regular salad dressings. Where to find more information:  National Heart, Lung, and [place]: https://wilson-eaton.com/  American Heart Association: www.heart.org Summary  The DASH eating plan is a healthy eating plan that has been shown to reduce high blood pressure (hypertension). It may also reduce your risk for type 2 diabetes, heart disease, and stroke.  With the DASH eating plan, you should limit salt (sodium) intake to 2,300 mg a day. If you have hypertension, you may need to reduce your sodium intake to 1,500 mg a day.  When on the DASH eating plan, aim to eat more  fresh fruits and vegetables, whole grains, lean proteins, low-fat dairy, and heart-healthy fats.  Work with your health care provider or diet and nutrition specialist (dietitian) to adjust your eating plan to your individual calorie needs. This information is not intended to replace advice given to you by your health care provider. Make sure you discuss any questions you have with your health care provider. Document Released: 12/22/2010 Document Revised: 12/27/2015  Document Reviewed: 12/27/2015 Elsevier Interactive Patient Education  2019 [employer].

## 2018-06-26 ENCOUNTER — Other Ambulatory Visit: Payer: Self-pay

## 2018-06-26 MED ORDER — ONETOUCH ULTRA MINI W/DEVICE KIT: PACK | 0 refills | Status: DC

## 2018-07-17 ENCOUNTER — Other Ambulatory Visit: Payer: Self-pay | Admitting: Nurse Practitioner

## 2018-07-17 MED ORDER — LOSARTAN POTASSIUM 50 MG PO TABS: 100.0000 mg | ORAL_TABLET | Freq: Every day | ORAL | 1 refills | Status: DC

## 2018-07-17 NOTE — Telephone Encounter (Signed)
Pharmacy requesting new RX, Losartan available in 25mg  strength only, please advise

## 2018-08-06 ENCOUNTER — Other Ambulatory Visit: Payer: Self-pay | Admitting: Nurse Practitioner

## 2018-08-15 ENCOUNTER — Other Ambulatory Visit: Payer: Self-pay | Admitting: Nurse Practitioner

## 2018-08-30 ENCOUNTER — Other Ambulatory Visit: Payer: Self-pay | Admitting: *Deleted

## 2018-08-30 DIAGNOSIS — N182 Chronic kidney disease, stage 2 (mild): Secondary | ICD-10-CM

## 2018-08-30 DIAGNOSIS — E1122 Type 2 diabetes mellitus with diabetic chronic kidney disease: Secondary | ICD-10-CM

## 2018-08-30 MED ORDER — METFORMIN HCL 1000 MG PO TABS: ORAL_TABLET | ORAL | 1 refills | Status: DC

## 2018-08-30 NOTE — Telephone Encounter (Signed)
[facility] 

## 2018-09-05 ENCOUNTER — Other Ambulatory Visit: Payer: Self-pay

## 2018-09-05 DIAGNOSIS — E782 Mixed hyperlipidemia: Secondary | ICD-10-CM

## 2018-09-05 MED ORDER — LOVASTATIN 40 MG PO TABS: ORAL_TABLET | ORAL | 2 refills | Status: DC

## 2018-09-13 ENCOUNTER — Encounter: Payer: BC Managed Care – PPO | Attending: Nurse Practitioner | Admitting: Dietician

## 2018-09-13 ENCOUNTER — Telehealth: Payer: Self-pay | Admitting: Dietician

## 2018-09-13 NOTE — Telephone Encounter (Signed)
Patient called and apologized for missing today's appointment.   Rescheduled for Thursday 9/3 at 1:00.  Antonieta Iba, RD, LDN, CDE

## 2018-09-19 ENCOUNTER — Ambulatory Visit: Payer: No Typology Code available for payment source | Admitting: Dietician

## 2018-09-27 ENCOUNTER — Telehealth: Payer: Self-pay

## 2018-09-27 NOTE — Telephone Encounter (Signed)
Last Kidney functions were high.Taking Furosemide can worsen the kidney function.will need an office appointment with Mitchell Billow NP to evaluate prior to restarting medication.

## 2018-09-27 NOTE — Telephone Encounter (Signed)
Patient called stating he needs a refill on furosemide   I reviewed medication and furosemide is no longer on list and was removed on 03/18/2018 by Sherrie Mustache, NP.  Patient states he takes Furosemide 20 mg 1 by mouth daily as needed for edema and has for several years. Patient is not sure why medication was removed from his list.  Patient aware I will have to get approval to add medication back to his list prior to refilling.  [facility] [address] routed to [employer], NP (covering for [name], NP)

## 2018-09-27 NOTE — Telephone Encounter (Signed)
Spoke with patient, patient would like to schedule an appointment with Janett Billow on Monday.  Appointment scheduled as requested

## 2018-09-27 NOTE — Telephone Encounter (Signed)
Left detailed message (ok per DPR) informing patient he needs to schedule an appointment to discuss.

## 2018-09-30 ENCOUNTER — Other Ambulatory Visit: Payer: Self-pay

## 2018-09-30 ENCOUNTER — Ambulatory Visit (INDEPENDENT_AMBULATORY_CARE_PROVIDER_SITE_OTHER): Payer: BC Managed Care – PPO | Admitting: Nurse Practitioner

## 2018-09-30 ENCOUNTER — Encounter: Payer: Self-pay | Admitting: Nurse Practitioner

## 2018-09-30 ENCOUNTER — Other Ambulatory Visit: Payer: BC Managed Care – PPO

## 2018-09-30 VITALS — BP 130/78 | HR 55 | Temp 97.3°F | Ht 72.0 in | Wt 216.0 lb

## 2018-09-30 DIAGNOSIS — N182 Chronic kidney disease, stage 2 (mild): Secondary | ICD-10-CM

## 2018-09-30 DIAGNOSIS — I10 Essential (primary) hypertension: Secondary | ICD-10-CM

## 2018-09-30 DIAGNOSIS — I872 Venous insufficiency (chronic) (peripheral): Secondary | ICD-10-CM | POA: Diagnosis not present

## 2018-09-30 DIAGNOSIS — Z794 Long term (current) use of insulin: Secondary | ICD-10-CM

## 2018-09-30 DIAGNOSIS — E890 Postprocedural hypothyroidism: Secondary | ICD-10-CM

## 2018-09-30 DIAGNOSIS — Z6829 Body mass index (BMI) 29.0-29.9, adult: Secondary | ICD-10-CM

## 2018-09-30 DIAGNOSIS — E782 Mixed hyperlipidemia: Secondary | ICD-10-CM

## 2018-09-30 DIAGNOSIS — Z23 Encounter for immunization: Secondary | ICD-10-CM

## 2018-09-30 DIAGNOSIS — E1122 Type 2 diabetes mellitus with diabetic chronic kidney disease: Secondary | ICD-10-CM

## 2018-09-30 DIAGNOSIS — E663 Overweight: Secondary | ICD-10-CM

## 2018-09-30 DIAGNOSIS — Q845 Enlarged and hypertrophic nails: Secondary | ICD-10-CM

## 2018-09-30 MED ORDER — LOSARTAN POTASSIUM 100 MG PO TABS: 100.0000 mg | ORAL_TABLET | Freq: Every day | ORAL | 1 refills | Status: DC

## 2018-09-30 MED ORDER — HYDROCHLOROTHIAZIDE 25 MG PO TABS: 25.0000 mg | ORAL_TABLET | Freq: Every day | ORAL | 1 refills | Status: DC

## 2018-09-30 NOTE — Progress Notes (Signed)
Careteam: Patient Care Team: [name], NP as PCP - General (Geriatric Medicine) Leighton Ruff, [place] (Optometry)  Advanced Directive information    No Known Allergies  Chief Complaint  Patient presents with  . Acute Visit    Discuss fluid pill, Foot Exam Due   . Medication Management    Discuss changing Losartan to 100 mg 1 by mouth daily vs 50 mg 2 by mouth daily   . Immunizations    Flu vcaccine today      HPI: Patient is a 68 y.o. male seen in the office today for acute visit but also due for routine follow up.  htn-stable on hctz, cozaar 100 mg by mouth daily  Le edema- taking lasix (which was removed from list) and hctz 25 mg by mouth daily is on his list but he does not feel like he is taking this medication. He reports he has changed diet and eating more vegetables. Has cut back on sodium intake.  Not wearing compression hose.   Diabetes- missed lab appt. Currently taking metformin 1000 mg by mouth twice daily. Checking blood sugars in the morning.  Highest reading was 150 (once in the last few months) Lows 65 but not normally. No hypoglycemia noted.   Hypothyroid- taking synthroid 112 mcg daily, TSH stable in April.   Hyperlipidemia- taking lovastatin   Review of Systems:  Review of Systems  Constitutional: Negative for chills, fever and weight loss.  HENT: Negative for hearing loss and tinnitus.   Respiratory: Negative for cough, sputum production and shortness of breath.   Cardiovascular: Positive for leg swelling (ankle). Negative for chest pain and palpitations.  Gastrointestinal: Negative for abdominal pain, constipation, diarrhea and heartburn.  Genitourinary: Negative for dysuria, frequency and urgency.  Musculoskeletal: Negative for back pain, falls, joint pain and myalgias.  Skin: Negative.   Neurological: Negative for dizziness, tingling, sensory change and headaches.  Psychiatric/Behavioral: Negative for depression. The patient is not  nervous/anxious.     Past Medical History:  Diagnosis Date  . Bell's palsy    2005  . Carpal tunnel syndrome   . Cataract   . CKD stage 2 due to type 2 diabetes mellitus ([place]) 12/01/2015  . Diabetes mellitus without complication ([place])   . Edema   . Gout, unspecified   . Hyperlipidemia   . Hypertension   . Internal hemorrhoids without mention of complication   . Malignant neoplasm of thyroid gland ([place])   . Memory loss   . 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 Obesity, unspecified   . Other malaise and fatigue   . Other psoriasis   . Sleep apnea    no CPAP used 09-23-15  . Supraventricular premature beats   . Thyroid disease   . Trigger finger (acquired)   . Unspecified sleep apnea    Past Surgical History:  Procedure Laterality Date  . CATARACT EXTRACTION W/ INTRAOCULAR LENS IMPLANT Right 06/05/2016   Dr. Arlina Robes  . COLONOSCOPY  10/07/2015   one polyp, Dr. Loletha Carrow  . TOTAL THYROIDECTOMY  2007   Dr. Georgina Quint   Social History:   reports that he has never smoked. He has never used smokeless tobacco. He reports that he does not drink alcohol or use drugs.  Family History  Problem Relation Age of Onset  . Cancer - Prostate Father   . Stomach cancer Father   . Hypertension Other   . Colon cancer Neg Hx   . Esophageal cancer Neg  Hx   . Rectal cancer Neg Hx     Medications: Patient's Medications  New Prescriptions   No medications on file  Previous Medications   ASPIRIN EC 81 MG TABLET    Take 81 mg by mouth daily.   BLOOD GLUCOSE MONITORING SUPPL (ONE TOUCH ULTRA MINI) W/DEVICE KIT    Check blood sugar once daily as directed E11.22   CYANOCOBALAMIN (B-12) 1000 MCG TABS    Take by mouth daily.   GLUCOSE BLOOD (ONE TOUCH ULTRA TEST) TEST STRIP    TEST 2 TIMES DAILY FOR DIABETES   HYDROCHLOROTHIAZIDE (HYDRODIURIL) 25 MG TABLET    Take 25 mg by mouth daily.   INSULIN DETEMIR (LEVEMIR FLEXPEN) 100 UNIT/ML PEN    Inject 44 Units into the skin daily at 10 pm.    INSULIN PEN NEEDLE (B-D ULTRAFINE III SHORT PEN) 31G X 8 MM MISC    Use for insulin injections. Dx: E11.22   LEVOTHYROXINE (SYNTHROID) 112 MCG TABLET    TAKE 1 TABLET (112 MCG TOTAL) BY MOUTH DAILY BEFORE BREAKFAST.   LOSARTAN (COZAAR) 50 MG TABLET    TAKE 2 TABLETS BY MOUTH EVERY DAY   LOVASTATIN (MEVACOR) 40 MG TABLET    TAKE 1 TABLET DAILY TO CONTROL CHOLESTEROL   METFORMIN (GLUCOPHAGE) 1000 MG TABLET    Take one tablet by mouth before breakfast and one tablet before supper to control blood sugar.   ONETOUCH DELICA LANCETS FINE MISC    Use to check blood sugar twice daily.   SILDENAFIL (VIAGRA) 100 MG TABLET    TAKE ONE TABLET BY MOUTH DAILY AS NEEDED FOR ERECTILE DYSFUNCTION   VITAMIN C (ASCORBIC ACID) 250 MG TABLET    Take 500 mg by mouth daily.  Modified Medications   No medications on file  Discontinued Medications   No medications on file    Physical Exam:  Vitals:   09/30/18 0925  BP: 130/78  Pulse: (!) 55  Temp: (!) 97.3 F (36.3 C)  TempSrc: Oral  SpO2: 98%  Weight: 216 lb (98 kg)  Height: 6' (1.829 m)   Body mass index is 29.29 kg/m. Wt Readings from Last 3 Encounters:  09/30/18 216 lb (98 kg)  06/21/18 218 lb (98.9 kg)  03/18/18 219 lb (99.3 kg)    Physical Exam Constitutional:      General: He is not in acute distress.    Appearance: Normal appearance. He is well-developed.  Eyes:     General: No scleral icterus.    Pupils: Pupils are equal, round, and reactive to light.  Neck:     Musculoskeletal: Neck supple.     Thyroid: No thyromegaly.     Vascular: No carotid bruit.  Cardiovascular:     Rate and Rhythm: Normal rate and regular rhythm.     Pulses:          Dorsalis pedis pulses are 1+ on the right side and 1+ on the left side.       Posterior tibial pulses are 1+ on the right side and 1+ on the left side.     Heart sounds: Murmur (1/6 SEM) present. No friction rub. No gallop.      Comments: no distal LE swelling. No calf TTP Pulmonary:      Effort: Pulmonary effort is normal.     Breath sounds: Normal breath sounds. No wheezing or rales.  Chest:     Chest wall: No tenderness.  Abdominal:     General: There is no  distension or abdominal bruit.     Palpations: There is no hepatomegaly, mass or pulsatile mass.     Tenderness: There is no abdominal tenderness.  Musculoskeletal:     Right lower leg: No edema.     Left lower leg: No edema.     Comments: Noted with trace left food and ankle swelling, no tenderness, heat or decrease ROM  Feet:     Right foot:     Protective Sensation: 3 sites tested. 3 sites sensed.     Skin integrity: Skin integrity normal.     Toenail Condition: Right toenails are abnormally thick and long.     Left foot:     Protective Sensation: 3 sites tested. 3 sites sensed.     Skin integrity: Skin integrity normal.     Toenail Condition: Left toenails are abnormally thick and long.  Lymphadenopathy:     Cervical: No cervical adenopathy.  Skin:    General: Skin is warm and dry.     Findings: No rash.  Neurological:     Mental Status: He is alert and oriented to person, place, and time.     Deep Tendon Reflexes: Reflexes are normal and symmetric.  Psychiatric:        Behavior: Behavior normal.        Thought Content: Thought content normal.        Judgment: Judgment normal.     Labs reviewed: Basic Metabolic Panel: Recent Labs    11/02/17 1122 03/18/18 1527 05/14/18 0837 06/21/18 1015  NA 141 137  --  140  K 4.4 3.7  --  4.1  CL 103 100  --  104  CO2 30 30  --  29  GLUCOSE 119* 75  --  114*  BUN 19 18  --  18  CREATININE 1.35* 1.34*  --  1.38*  CALCIUM 9.8 9.9  --  9.2  TSH 0.48 0.25* 0.94  --    Liver Function Tests: Recent Labs    11/02/17 1122 03/18/18 1527  AST 18 16  ALT 12 11  BILITOT 0.4 0.5  PROT 7.1 7.1   No results for input(s): LIPASE, AMYLASE in the last 8760 hours. No results for input(s): AMMONIA in the last 8760 hours. CBC: Recent Labs    11/02/17 1122  03/18/18 1527  WBC 3.6* 3.9  NEUTROABS 1,886 1,997  HGB 13.4 12.7*  HCT 40.3 37.7*  MCV 81.1 80.6  PLT 255 267   Lipid Panel: Recent Labs    03/18/18 1527  CHOL 147  HDL 60  LDLCALC 73  TRIG 61  CHOLHDL 2.5   TSH: Recent Labs    11/02/17 1122 03/18/18 1527 05/14/18 0837  TSH 0.48 0.25* 0.94   A1C: Lab Results  Component Value Date   HGBA1C 6.8 (H) 03/18/2018     Assessment/Plan 1. Mixed hyperlipidemia -has made significant dietary changes. Continues on lovastatin.  - COMPLETE METABOLIC PANEL WITH GFR - Lipid Panel  2. Need for influenza vaccination - Flu Vaccine QUAD High Dose(Fluad)  3. Edema of left lower extremity due to peripheral venous insufficiency -lasix off medication list due to the fact he was not prevsioulsy taking but has been recently. He is unsure if he has been taking the HCTZ so he will check on this. Without significant edema at this time. To continue to reduce sodium and encouraged to wear compression hose throughout the day.  - hydrochlorothiazide (HYDRODIURIL) 25 MG tablet; Take 1 tablet (25 mg total) by mouth  daily.  Dispense: 90 tablet; Refill: 1  4. Essential hypertension -stable on current regimen. Continue medication as prescribed with dietary modifications.  - losartan (COZAAR) 100 MG tablet; Take 1 tablet (100 mg total) by mouth daily.  Dispense: 90 tablet; Refill: 1 - hydrochlorothiazide (HYDRODIURIL) 25 MG tablet; Take 1 tablet (25 mg total) by mouth daily.  Dispense: 90 tablet; Refill: 1 - CBC with Differential/Platelet  5. Overweight with body mass index (BMI) 25.0-29.9 Ongoing weight loss with diet and exercise.  6. Body mass index 29.0-29.9, adult Noted today, improved bmi!  7. Type 2 diabetes mellitus with stage 2 chronic kidney disease, with long-term current use of insulin ([place]) -well controlled at this time. Continues on metformin without hypoglycemia.  Encouraged dietary compliance, routine foot care/monitoring and to  keep up with diabetic eye exams through ophthalmology  - CBC with Differential/Platelet - Hemoglobin A1c - Ambulatory referral to Podiatry  8. Enlarged and hypertrophic nails - Ambulatory referral to Podiatry  9. Postoperative hypothyroidism Continues on synthroid, TSH stable in April.   Next appt: 3 months.  Carlos American. [place], [facility] [phone]

## 2018-09-30 NOTE — Patient Instructions (Signed)
Look for compression hose at a medical supply store or scrub shop   [place] stands for "Dietary Approaches to Stop Hypertension." The DASH eating plan is a healthy eating plan that has been shown to reduce high blood pressure (hypertension). It may also reduce your risk for type 2 diabetes, heart disease, and stroke. The DASH eating plan may also help with weight loss. What are tips for following this plan?  General guidelines  Avoid eating more than 2,300 mg (milligrams) of salt (sodium) a day. If you have hypertension, you may need to reduce your sodium intake to 1,500 mg a day.  Limit alcohol intake to no more than 1 drink a day for nonpregnant women and 2 drinks a day for men. One drink equals 12 oz of beer, 5 oz of wine, or 1 oz of hard liquor.  Work with your health care provider to maintain a healthy body weight or to lose weight. Ask what an ideal weight is for you.  Get at least 30 minutes of exercise that causes your heart to beat faster (aerobic exercise) most days of the week. Activities may include walking, swimming, or biking.  Work with your health care provider or diet and nutrition specialist (dietitian) to adjust your eating plan to your individual calorie needs. Reading food labels   Check food labels for the amount of sodium per serving. Choose foods with less than 5 percent of the Daily Value of sodium. Generally, foods with less than 300 mg of sodium per serving fit into this eating plan.  To find whole grains, look for the word "whole" as the first word in the ingredient list. Shopping  Buy products labeled as "low-sodium" or "no salt added."  Buy fresh foods. Avoid canned foods and premade or frozen meals. Cooking  Avoid adding salt when cooking. Use salt-free seasonings or herbs instead of table salt or sea salt. Check with your health care provider or pharmacist before using salt substitutes.  Do not fry foods. Cook foods using healthy methods  such as baking, boiling, grilling, and broiling instead.  Cook with heart-healthy oils, such as olive, canola, soybean, or sunflower oil. Meal planning  Eat a balanced diet that includes: ? 5 or more servings of fruits and vegetables each day. At each meal, try to fill half of your plate with fruits and vegetables. ? Up to 6-8 servings of whole grains each day. ? Less than 6 oz of lean meat, poultry, or fish each day. A 3-oz serving of meat is about the same size as a deck of cards. One egg equals 1 oz. ? 2 servings of low-fat dairy each day. ? A serving of nuts, seeds, or beans 5 times each week. ? Heart-healthy fats. Healthy fats called Omega-3 fatty acids are found in foods such as flaxseeds and coldwater fish, like sardines, salmon, and mackerel.  Limit how much you eat of the following: ? Canned or prepackaged foods. ? Food that is high in trans fat, such as fried foods. ? Food that is high in saturated fat, such as fatty meat. ? Sweets, desserts, sugary drinks, and other foods with added sugar. ? Full-fat dairy products.  Do not salt foods before eating.  Try to eat at least 2 vegetarian meals each week.  Eat more home-cooked food and less restaurant, buffet, and fast food.  When eating at a restaurant, ask that your food be prepared with less salt or no salt, if possible. What foods are recommended?  The items listed may not be a complete list. Talk with your dietitian about what dietary choices are best for you. Grains Whole-grain or whole-wheat bread. Whole-grain or whole-wheat pasta. Brown rice. [place]. Bulgur. Whole-grain and low-sodium cereals. Pita bread. Low-fat, low-sodium crackers. Whole-wheat flour tortillas. Vegetables Fresh or frozen vegetables (raw, steamed, roasted, or grilled). Low-sodium or reduced-sodium tomato and vegetable juice. Low-sodium or reduced-sodium tomato sauce and tomato paste. Low-sodium or reduced-sodium canned vegetables. Fruits All  fresh, dried, or frozen fruit. Canned fruit in natural juice (without added sugar). Meat and other protein foods Skinless chicken or [place]. Ground chicken or [place]. Pork with fat trimmed off. Fish and seafood. Egg whites. Dried beans, peas, or lentils. Unsalted nuts, nut butters, and seeds. Unsalted canned beans. Lean cuts of beef with fat trimmed off. Low-sodium, lean deli meat. Dairy Low-fat (1%) or fat-free (skim) milk. Fat-free, low-fat, or reduced-fat cheeses. Nonfat, low-sodium ricotta or cottage cheese. Low-fat or nonfat yogurt. Low-fat, low-sodium cheese. Fats and oils Soft margarine without trans fats. Vegetable oil. Low-fat, reduced-fat, or light mayonnaise and salad dressings (reduced-sodium). Canola, safflower, olive, soybean, and sunflower oils. Avocado. Seasoning and other foods Herbs. Spices. Seasoning mixes without salt. Unsalted popcorn and pretzels. Fat-free sweets. What foods are not recommended? The items listed may not be a complete list. Talk with your dietitian about what dietary choices are best for you. Grains Baked goods made with fat, such as croissants, muffins, or some breads. Dry pasta or rice meal packs. Vegetables Creamed or fried vegetables. Vegetables in a cheese sauce. Regular canned vegetables (not low-sodium or reduced-sodium). Regular canned tomato sauce and paste (not low-sodium or reduced-sodium). Regular tomato and vegetable juice (not low-sodium or reduced-sodium). Angie Fava. Olives. Fruits Canned fruit in a light or heavy syrup. Fried fruit. Fruit in cream or butter sauce. Meat and other protein foods Fatty cuts of meat. Ribs. Fried meat. Berniece Salines. Sausage. Bologna and other processed lunch meats. Salami. Fatback. Hotdogs. Bratwurst. Salted nuts and seeds. Canned beans with added salt. Canned or smoked fish. Whole eggs or egg yolks. Chicken or [place] with skin. Dairy Whole or 2% milk, cream, and half-and-half. Whole or full-fat cream cheese. Whole-fat or  sweetened yogurt. Full-fat cheese. Nondairy creamers. Whipped toppings. Processed cheese and cheese spreads. Fats and oils Butter. Stick margarine. Lard. Shortening. Ghee. Bacon fat. Tropical oils, such as coconut, palm kernel, or palm oil. Seasoning and other foods Salted popcorn and pretzels. Onion salt, garlic salt, seasoned salt, table salt, and sea salt. Worcestershire sauce. Tartar sauce. Barbecue sauce. Teriyaki sauce. Soy sauce, including reduced-sodium. Steak sauce. Canned and packaged gravies. Fish sauce. Oyster sauce. Cocktail sauce. Horseradish that you find on the shelf. Ketchup. Mustard. Meat flavorings and tenderizers. Bouillon cubes. Hot sauce and Tabasco sauce. Premade or packaged marinades. Premade or packaged taco seasonings. Relishes. Regular salad dressings. Where to find more information:  National Heart, Lung, and [place]: https://wilson-eaton.com/  American Heart Association: www.heart.org Summary  The DASH eating plan is a healthy eating plan that has been shown to reduce high blood pressure (hypertension). It may also reduce your risk for type 2 diabetes, heart disease, and stroke.  With the DASH eating plan, you should limit salt (sodium) intake to 2,300 mg a day. If you have hypertension, you may need to reduce your sodium intake to 1,500 mg a day.  When on the DASH eating plan, aim to eat more fresh fruits and vegetables, whole grains, lean proteins, low-fat dairy, and heart-healthy fats.  Work with your health care  provider or diet and nutrition specialist (dietitian) to adjust your eating plan to your individual calorie needs. This information is not intended to replace advice given to you by your health care provider. Make sure you discuss any questions you have with your health care provider. Document Released: 12/22/2010 Document Revised: 12/15/2016 Document Reviewed: 12/27/2015 Elsevier Patient Education  2020 [employer].

## 2018-10-01 ENCOUNTER — Encounter: Payer: Self-pay | Admitting: Nurse Practitioner

## 2018-10-01 ENCOUNTER — Ambulatory Visit (INDEPENDENT_AMBULATORY_CARE_PROVIDER_SITE_OTHER): Payer: BC Managed Care – PPO | Admitting: Nurse Practitioner

## 2018-10-01 DIAGNOSIS — I10 Essential (primary) hypertension: Secondary | ICD-10-CM

## 2018-10-01 DIAGNOSIS — I872 Venous insufficiency (chronic) (peripheral): Secondary | ICD-10-CM | POA: Diagnosis not present

## 2018-10-01 DIAGNOSIS — N182 Chronic kidney disease, stage 2 (mild): Secondary | ICD-10-CM

## 2018-10-01 DIAGNOSIS — E782 Mixed hyperlipidemia: Secondary | ICD-10-CM

## 2018-10-01 DIAGNOSIS — Z794 Long term (current) use of insulin: Secondary | ICD-10-CM

## 2018-10-01 DIAGNOSIS — E890 Postprocedural hypothyroidism: Secondary | ICD-10-CM

## 2018-10-01 DIAGNOSIS — E1122 Type 2 diabetes mellitus with diabetic chronic kidney disease: Secondary | ICD-10-CM

## 2018-10-01 LAB — COMPLETE METABOLIC PANEL WITH GFR
AG Ratio: 1.4 (calc) (ref 1.0–2.5)
ALT: 11 U/L (ref 9–46)
AST: 14 U/L (ref 10–35)
Albumin: 3.9 g/dL (ref 3.6–5.1)
Alkaline phosphatase (APISO): 68 U/L (ref 35–144)
BUN: 16 mg/dL (ref 7–25)
CO2: 30 mmol/L (ref 20–32)
Calcium: 9.4 mg/dL (ref 8.6–10.3)
Chloride: 105 mmol/L (ref 98–110)
Creat: 1.09 mg/dL (ref 0.70–1.25)
GFR, Est African American: 82 mL/min/{1.73_m2} (ref >=60)
GFR, Est Non African American: 70 mL/min/{1.73_m2} (ref >=60)
Globulin: 2.7 g/dL (calc) (ref 1.9–3.7)
Glucose, Bld: 109 mg/dL — ABNORMAL HIGH (ref 65–99)
Potassium: 4.1 mmol/L (ref 3.5–5.3)
Sodium: 139 mmol/L (ref 135–146)
Total Bilirubin: 0.3 mg/dL (ref 0.2–1.2)
Total Protein: 6.6 g/dL (ref 6.1–8.1)

## 2018-10-01 LAB — CBC WITH DIFFERENTIAL/PLATELET
Absolute Monocytes: 300 cells/uL (ref 200–950)
Basophils Absolute: 20 cells/uL (ref 0–200)
Basophils Relative: 0.6 %
Eosinophils Absolute: 99 cells/uL (ref 15–500)
Eosinophils Relative: 3 %
HCT: 38.1 % — ABNORMAL LOW (ref 38.5–50.0)
Hemoglobin: 12.5 g/dL — ABNORMAL LOW (ref 13.2–17.1)
Lymphs Abs: 1096 cells/uL (ref 850–3900)
MCH: 27.2 pg (ref 27.0–33.0)
MCHC: 32.8 g/dL (ref 32.0–36.0)
MCV: 82.8 fL (ref 80.0–100.0)
MPV: 9.8 fL (ref 7.5–12.5)
Monocytes Relative: 9.1 %
Neutro Abs: 1785 cells/uL (ref 1500–7800)
Neutrophils Relative %: 54.1 %
Platelets: 234 10*3/uL (ref 140–400)
RBC: 4.6 10*6/uL (ref 4.20–5.80)
RDW: 14.2 % (ref 11.0–15.0)
Total Lymphocyte: 33.2 %
WBC: 3.3 10*3/uL — ABNORMAL LOW (ref 3.8–10.8)

## 2018-10-01 LAB — LIPID PANEL
Cholesterol: 145 mg/dL (ref <200)
HDL: 60 mg/dL (ref >=40)
LDL Cholesterol (Calc): 71 mg/dL (calc)
Non-HDL Cholesterol (Calc): 85 mg/dL (calc) (ref <130)
Total CHOL/HDL Ratio: 2.4 (calc) (ref <5.0)
Triglycerides: 55 mg/dL (ref <150)

## 2018-10-01 LAB — HEMOGLOBIN A1C
Hgb A1c MFr Bld: 6 % of total Hgb — ABNORMAL HIGH (ref <5.7)
Mean Plasma Glucose: 126 (calc)
eAG (mmol/L): 7 (calc)

## 2018-10-01 MED ORDER — METFORMIN HCL 500 MG PO TABS: ORAL_TABLET | ORAL | 3 refills | Status: DC

## 2018-10-01 NOTE — Progress Notes (Signed)
This service is provided via telemedicine  No vital signs collected/recorded due to the encounter was a telemedicine visit.   Location of patient (ex: home, work):  Home  Patient consents to a telephone visit:  Yes  Location of the provider (ex: office, home):  Office.  Name of any referring provider:  Sherrie Mustache, NP  Names of all persons participating in the telemedicine service and their role in the encounter:  Sherrie Mustache, NP; Bonney Leitz, [place]; Vella Redhead  Time spent on call:  11.47 minutes - CMA time only, does not include provider time.      Careteam: Patient Care Team: [name], NP as PCP - General (Geriatric Medicine) Leighton Ruff, [employer] (Optometry)  Advanced Directive information    No Known Allergies  Chief Complaint  Patient presents with  . Acute Visit    Patient has concerns about his blood pressure medication.      HPI: Patient is a 68 y.o. male for confusion with blood pressure medication. He is taking losartan 100 mg daily. Has not been taking hctz ?unsure why. Was only using lasix as needed.  Feels like occasionally he is holding onto too much fluid. But much better with diet changes Ankles tend to puff occasionally and he will rest and elevate which will improve swelling. Getting compression hose today.   DM- A1c has been improving has pt has made significant changes to diet and exercise. a1c is 6.0!     Review of Systems:  Review of Systems  Constitutional: Negative for chills, fever and weight loss.  HENT: Negative for hearing loss and tinnitus.   Respiratory: Negative for cough, sputum production and shortness of breath.   Cardiovascular: Negative for chest pain, palpitations and leg swelling.  Gastrointestinal: Negative for abdominal pain, constipation, diarrhea and heartburn.  Genitourinary: Negative for dysuria, frequency and urgency.  Skin: Negative.   Neurological: Negative for dizziness, tingling, sensory change  and headaches.    Past Medical History:  Diagnosis Date  . Bell's palsy    2005  . Carpal tunnel syndrome   . Cataract   . CKD stage 2 due to type 2 diabetes mellitus ([place]) 12/01/2015  . Diabetes mellitus without complication ([place])   . Edema   . Gout, unspecified   . Hyperlipidemia   . Hypertension   . Internal hemorrhoids without mention of complication   . Malignant neoplasm of thyroid gland ([place])   . Memory loss   . 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 Obesity, unspecified   . Other malaise and fatigue   . Other psoriasis   . Sleep apnea    no CPAP used 09-23-15  . Supraventricular premature beats   . Thyroid disease   . Trigger finger (acquired)   . Unspecified sleep apnea    Past Surgical History:  Procedure Laterality Date  . CATARACT EXTRACTION W/ INTRAOCULAR LENS IMPLANT Right 06/05/2016   Dr. Arlina Robes  . COLONOSCOPY  10/07/2015   one polyp, Dr. Loletha Carrow  . TOTAL THYROIDECTOMY  2007   Dr. Georgina Quint   Social History:   reports that he has never smoked. He has never used smokeless tobacco. He reports that he does not drink alcohol or use drugs.  Family History  Problem Relation Age of Onset  . Cancer - Prostate Father   . Stomach cancer Father   . Hypertension Other   . Colon cancer Neg Hx   . Esophageal cancer Neg Hx   . Rectal cancer  Neg Hx     Medications: Patient's Medications  New Prescriptions   No medications on file  Previous Medications   ASPIRIN EC 81 MG TABLET    Take 81 mg by mouth daily.   BLOOD GLUCOSE MONITORING SUPPL (ONE TOUCH ULTRA MINI) W/DEVICE KIT    Check blood sugar once daily as directed E11.22   CYANOCOBALAMIN (B-12) 1000 MCG TABS    Take by mouth daily.   GLUCOSE BLOOD (ONE TOUCH ULTRA TEST) TEST STRIP    TEST 2 TIMES DAILY FOR DIABETES   HYDROCHLOROTHIAZIDE (HYDRODIURIL) 25 MG TABLET    Take 1 tablet (25 mg total) by mouth daily.   INSULIN DETEMIR (LEVEMIR FLEXPEN) 100 UNIT/ML PEN    Inject 44 Units into the skin daily at  10 pm.   INSULIN PEN NEEDLE (B-D ULTRAFINE III SHORT PEN) 31G X 8 MM MISC    Use for insulin injections. Dx: E11.22   LEVOTHYROXINE (SYNTHROID) 112 MCG TABLET    TAKE 1 TABLET (112 MCG TOTAL) BY MOUTH DAILY BEFORE BREAKFAST.   LOSARTAN (COZAAR) 100 MG TABLET    Take 1 tablet (100 mg total) by mouth daily.   LOVASTATIN (MEVACOR) 40 MG TABLET    TAKE 1 TABLET DAILY TO CONTROL CHOLESTEROL   METFORMIN (GLUCOPHAGE) 1000 MG TABLET    Take one tablet by mouth before breakfast and one tablet before supper to control blood sugar.   ONETOUCH DELICA LANCETS FINE MISC    Use to check blood sugar twice daily.   SILDENAFIL (VIAGRA) 100 MG TABLET    TAKE ONE TABLET BY MOUTH DAILY AS NEEDED FOR ERECTILE DYSFUNCTION   VITAMIN C (ASCORBIC ACID) 250 MG TABLET    Take 500 mg by mouth daily.  Modified Medications   No medications on file  Discontinued Medications   No medications on file    Physical Exam:  There were no vitals filed for this visit. There is no height or weight on file to calculate BMI. Wt Readings from Last 3 Encounters:  09/30/18 216 lb (98 kg)  06/21/18 218 lb (98.9 kg)  03/18/18 219 lb (99.3 kg)      Labs reviewed: Basic Metabolic Panel: Recent Labs    11/02/17 1122 03/18/18 1527 05/14/18 0837 06/21/18 1015 09/30/18 1011  NA 141 137  --  140 139  K 4.4 3.7  --  4.1 4.1  CL 103 100  --  104 105  CO2 30 30  --  29 30  GLUCOSE 119* 75  --  114* 109*  BUN 19 18  --  18 16  CREATININE 1.35* 1.34*  --  1.38* 1.09  CALCIUM 9.8 9.9  --  9.2 9.4  TSH 0.48 0.25* 0.94  --   --    Liver Function Tests: Recent Labs    11/02/17 1122 03/18/18 1527 09/30/18 1011  AST '18 16 14  '$ ALT '12 11 11  '$ BILITOT 0.4 0.5 0.3  PROT 7.1 7.1 6.6   No results for input(s): LIPASE, AMYLASE in the last 8760 hours. No results for input(s): AMMONIA in the last 8760 hours. CBC: Recent Labs    11/02/17 1122 03/18/18 1527 09/30/18 1011  WBC 3.6* 3.9 3.3*  NEUTROABS 1,886 1,997 1,785  HGB 13.4  12.7* 12.5*  HCT 40.3 37.7* 38.1*  MCV 81.1 80.6 82.8  PLT 255 267 234   Lipid Panel: Recent Labs    03/18/18 1527 09/30/18 1011  CHOL 147 145  HDL 60 60  LDLCALC 73 71  TRIG 61 55  CHOLHDL 2.5 2.4   TSH: Recent Labs    11/02/17 1122 03/18/18 1527 05/14/18 0837  TSH 0.48 0.25* 0.94   A1C: Lab Results  Component Value Date   HGBA1C 6.0 (H) 09/30/2018     Assessment/Plan 1. Mixed hyperlipidemia -refill proved on lovastatin, continue on dietary modifications  -LDL at goal  2. Essential hypertension Stable on last visit, he is NOT taking hctz, unsure why but will remove from list as bp controlled with losartan and dietary changes  3. Edema of left lower extremity due to peripheral venous insufficiency Improved with diet changes, lasix was removed from medication list due to non-use and he has not been taking hctz. He is getting compression hose today and encouraged lifestyle modifications to help with edema to ankles which has been working well since changes in diet. To notify if swelling worsens.   4. Type 2 diabetes mellitus with stage 2 chronic kidney disease, with long-term current use of insulin (HCC) -a1c continues to improve! Will reduce metformin to 500 mg by mouth twice daily due to improved blood sugars due to lifestyle modifications. - metFORMIN (GLUCOPHAGE) 500 MG tablet; Take one tablet by mouth before breakfast and one tablet before supper to control blood sugar.  Dispense: 120 tablet; Refill: 3  5. CKD stage 2 due to type 2 diabetes mellitus ([place]) Off all diuretics with improvement in renal function.   Next appt: 3 months with lab work before visit [place]. Harle Battiest  [facility] [phone]    Virtual Visit via Telephone Note  I connected with pt on 10/01/18 at  3:45 PM EDT by telephone and verified that I am speaking with the correct person using two identifiers.  Location: Patient: home Provider: office     I discussed the limitations, risks, security and privacy concerns of performing an evaluation and management service by telephone and the availability of in person appointments. I also discussed with the patient that there may be a patient responsible charge related to this service. The patient expressed understanding and agreed to proceed.   I discussed the assessment and treatment plan with the patient. The patient was provided an opportunity to ask questions and all were answered. The patient agreed with the plan and demonstrated an understanding of the instructions.   The patient was advised to call back or seek an in-person evaluation if the symptoms worsen or if the condition fails to improve as anticipated.  I provided 15 minutes of non-face-to-face time during this encounter.  [employer]. Harle Battiest Avs printed and mailed

## 2018-10-03 ENCOUNTER — Ambulatory Visit: Payer: BC Managed Care – PPO | Admitting: Nurse Practitioner

## 2018-10-04 NOTE — Addendum Note (Signed)
Addended by: [name] on: 10/04/2018 12:09 PM   Modules accepted: Level of Service

## 2018-10-14 ENCOUNTER — Ambulatory Visit: Payer: BC Managed Care – PPO | Admitting: Nurse Practitioner

## 2018-10-25 ENCOUNTER — Other Ambulatory Visit: Payer: Self-pay

## 2018-10-25 ENCOUNTER — Ambulatory Visit (INDEPENDENT_AMBULATORY_CARE_PROVIDER_SITE_OTHER): Payer: BC Managed Care – PPO | Admitting: Podiatry

## 2018-10-25 ENCOUNTER — Encounter: Payer: Self-pay | Admitting: Podiatry

## 2018-10-25 VITALS — BP 136/78 | HR 64

## 2018-10-25 DIAGNOSIS — Z0189 Encounter for other specified special examinations: Secondary | ICD-10-CM

## 2018-10-25 DIAGNOSIS — E119 Type 2 diabetes mellitus without complications: Secondary | ICD-10-CM | POA: Diagnosis not present

## 2018-10-25 DIAGNOSIS — N182 Chronic kidney disease, stage 2 (mild): Secondary | ICD-10-CM | POA: Diagnosis not present

## 2018-10-25 DIAGNOSIS — M79674 Pain in right toe(s): Secondary | ICD-10-CM

## 2018-10-25 DIAGNOSIS — L84 Corns and callosities: Secondary | ICD-10-CM

## 2018-10-25 DIAGNOSIS — M79675 Pain in left toe(s): Secondary | ICD-10-CM | POA: Diagnosis not present

## 2018-10-25 DIAGNOSIS — E1122 Type 2 diabetes mellitus with diabetic chronic kidney disease: Secondary | ICD-10-CM | POA: Diagnosis not present

## 2018-10-25 DIAGNOSIS — B351 Tinea unguium: Secondary | ICD-10-CM | POA: Diagnosis not present

## 2018-10-25 DIAGNOSIS — Z794 Long term (current) use of insulin: Secondary | ICD-10-CM

## 2018-10-25 MED ORDER — NONFORMULARY OR COMPOUNDED ITEM: 3 refills | Status: AC

## 2018-10-25 NOTE — Progress Notes (Signed)
Subjective: Mitchell Gibbs presents today referred by Mitchell Chandler, NP for diabetic foot evaluation.  Patient relates 8 year history of diabetes.  Patient denies any history of foot wounds.  Patient denies any history of numbness, tingling, burning, pins/needles sensations.  Today, patient c/o of painful, discolored, thick toenails which interfere with daily activities.  Pain is aggravated when wearing enclosed shoe gear.   Mitchell Gibbs also has b/l lower extremity edema.   Past Medical History:  Diagnosis Date  . Bell's palsy    2005  . Carpal tunnel syndrome   . Cataract   . CKD stage 2 due to type 2 diabetes mellitus ([place]) 12/01/2015  . Diabetes mellitus without complication ([place])   . Edema   . Gout, unspecified   . Hyperlipidemia   . Hypertension   . Internal hemorrhoids without mention of complication   . Malignant neoplasm of thyroid gland ([place])   . Memory loss   . 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 Obesity, unspecified   . Other malaise and fatigue   . Other psoriasis   . Sleep apnea    no CPAP used 09-23-15  . Supraventricular premature beats   . Thyroid disease   . Trigger finger (acquired)   . Unspecified sleep apnea     Patient Active Problem List   Diagnosis Date Noted  . Postoperative hypothyroidism 05/16/2017  . High risk medication use 05/16/2017  . CKD stage 2 due to type 2 diabetes mellitus ([place]) 12/01/2015  . Cerumen impaction 07/28/2015  . BPPV (benign paroxysmal positional vertigo) 10/06/2014  . Pain in finger of left hand 07/28/2014  . Flank pain 05/12/2014  . Tinea pedis of left foot 02/09/2014  . Chest pain, unspecified 05/07/2013  . Immunization due 04/15/2013  . Erectile dysfunction 03/12/2013  . DM (diabetes mellitus), type 2 with renal complications ([place]) 96/22/2979  . Edema 08/13/2012  . Hypertension   . Malignant neoplasm of thyroid gland ([place])   . Memory loss   . Obesity   . Unspecified sleep apnea   . Hyperlipidemia      Past Surgical History:  Procedure Laterality Date  . CATARACT EXTRACTION W/ INTRAOCULAR LENS IMPLANT Right 06/05/2016   Dr. Arlina Robes  . COLONOSCOPY  10/07/2015   one polyp, Dr. Loletha Carrow  . TOTAL THYROIDECTOMY  2007   Dr. Georgina Quint    Current Outpatient Medications on File Prior to Visit  Medication Sig Dispense Refill  . aspirin EC 81 MG tablet Take 81 mg by mouth daily.    . Blood Glucose Monitoring Suppl (ONE TOUCH ULTRA MINI) w/Device KIT Check blood sugar once daily as directed E11.22 1 each 0  . Cyanocobalamin (B-12) 1000 MCG TABS Take by mouth daily.    Marland Kitchen glucose blood (ONE TOUCH ULTRA TEST) test strip TEST 2 TIMES DAILY FOR DIABETES 100 each 11  . Insulin Detemir (LEVEMIR FLEXPEN) 100 UNIT/ML Pen Inject 44 Units into the skin daily at 10 pm. 20 mL 6  . Insulin Pen Needle (B-D ULTRAFINE III SHORT PEN) 31G X 8 MM MISC Use for insulin injections. Dx: E11.22 300 each 3  . levothyroxine (SYNTHROID) 112 MCG tablet TAKE 1 TABLET (112 MCG TOTAL) BY MOUTH DAILY BEFORE BREAKFAST. 90 tablet 1  . losartan (COZAAR) 100 MG tablet Take 1 tablet (100 mg total) by mouth daily. 90 tablet 1  . lovastatin (MEVACOR) 40 MG tablet TAKE 1 TABLET DAILY TO CONTROL CHOLESTEROL 90 tablet 2  . metFORMIN (GLUCOPHAGE) 500  MG tablet Take one tablet by mouth before breakfast and one tablet before supper to control blood sugar. 120 tablet 3  . ONETOUCH DELICA LANCETS FINE MISC Use to check blood sugar twice daily. 100 each 11  . sildenafil (VIAGRA) 100 MG tablet TAKE ONE TABLET BY MOUTH DAILY AS NEEDED FOR ERECTILE DYSFUNCTION 22 tablet 1  . vitamin C (ASCORBIC ACID) 250 MG tablet Take 500 mg by mouth daily.     No current facility-administered medications on file prior to visit.      No Known Allergies  Social History   Occupational History  . Not on file  Tobacco Use  . Smoking status: Former Smoker    Quit date: 10/27/1978    Years since quitting: 40.0  . Smokeless tobacco: Never Used  Substance  and Sexual Activity  . Alcohol use: Yes    Comment: rarely  . Drug use: No  . Sexual activity: Not on file    Family History  Problem Relation Age of Onset  . Cancer - Prostate Father   . Stomach cancer Father   . Hypertension Other   . Colon cancer Neg Hx   . Esophageal cancer Neg Hx   . Rectal cancer Neg Hx     Immunization History  Administered Date(s) Administered  . Fluad Quad(high Dose 65+) 09/30/2018  . Influenza-Unspecified 09/16/2012, 09/17/2014, 09/17/2015, 10/17/2016, 10/19/2017  . Pneumococcal Conjugate-13 05/12/2014, 03/18/2018  . Pneumococcal Polysaccharide-23 10/29/2001  . Tdap 03/06/2011    Review of systems: Positive Findings in bold print.  Constitutional:  chills, fatigue, fever, sweats, weight change Communication: Optometrist, sign Ecologist, hand writing, iPad/Android device Head: headaches, head injury Eyes: changes in vision, eye pain, glaucoma, cataracts, macular degeneration, diplopia, glare,  light sensitivity, eyeglasses or contacts, blindness Ears nose mouth throat: hearing impaired, hearing aids,  ringing in ears, deaf, sign language,  vertigo, nosebleeds,  rhinitis,  cold sores, snoring, swollen glands Cardiovascular: HTN, edema, arrhythmia, pacemaker in place, defibrillator in place, chest pain/tightness, chronic anticoagulation, blood clot, heart failure, MI Peripheral Vascular: leg cramps, varicose veins, blood clots, lymphedema, varicosities Respiratory:  difficulty breathing, denies congestion, SOB, wheezing, cough, emphysema Gastrointestinal: change in appetite or weight, abdominal pain, constipation, diarrhea, nausea, vomiting, vomiting blood, change in bowel habits, abdominal pain, jaundice, rectal bleeding, hemorrhoids, GERD Genitourinary:  nocturia,  pain on urination, polyuria,  blood in urine, Foley catheter, urinary urgency, ESRD on hemodialysis Musculoskeletal: amputation, cramping, stiff joints, painful joints, decreased  joint motion, fractures, OA, gout, hemiplegia, paraplegia, uses cane, wheelchair bound, uses walker, uses rollator Skin: +changes in toenails, color change, dryness, itching, mole changes,  rash, wound(s) Neurological: headaches, numbness in feet, paresthesias in feet, burning in feet, fainting,  seizures, change in speech,  headaches, memory problems/poor historian, cerebral palsy, weakness, paralysis, CVA, TIA Endocrine: diabetes, hypothyroidism, hyperthyroidism,  goiter, dry mouth, flushing, heat intolerance,  cold intolerance,  excessive thirst, denies polyuria,  nocturia Hematological:  easy bleeding, excessive bleeding, easy bruising, enlarged lymph nodes, on long term blood thinner, history of past transusions Allergy/immunological:  hives, eczema, frequent infections, multiple drug allergies, seasonal allergies, transplant recipient, multiple food allergies Psychiatric:  anxiety, depression, mood disorder, suicidal ideations, hallucinations, insomnia  Objective: Vitals:   10/25/18 0941  BP: 136/78  Pulse: 64   Vascular Examination: Capillary refill time immediate x 10 digits  Dorsalis pedis pulses palpable b/l.  Posterior tibial pulses palpable b/l.  Digital hair sparse b/l.   Skin temperature gradient WNL b/l.  Edema b/l ankles/feet. No open wounds.  No warmth. No pain with calf compression.  Dermatological Examination: Skin with normal turgor, texture and tone b/l.  Toenails 1-5 b/l discolored, thick, dystrophic with subungual debris and pain with palpation to nailbeds due to thickness of nails.  Hyperkeratotic lesion submet head 2 left foot, submet head 5 right foot and b/l hallux. No edema, no erythema, no drainage, no flocculence.  Musculoskeletal: Muscle strength 5/5 to all LE muscle groups.  HAV with bunion b/l.  Hammertoes 2-5 b/l.  Neurological: Sensation intact with 10 gram monofilament Vibratory sensation intact.  Assessment: 1. Painful onychomycosis  toenails 1-5 b/l  2. Calluses submet head 2 left foot, submet head 5 right foot and b/l hallux 3. NIDDM  Plan: 1. Discussed diabetic foot care principles. Literature dispensed on today. 2. Calluses pared submet head 2 left foot, submet head 5 right foot and b/l hallux utilizing sterile scalpel blade without incident. 3. Toenails 1-5 b/l were debrided in length and girth without iatrogenic bleeding.Rx written for nonformulary compounding topical antifungal: [facility]: Antifungal cream - Terbinafine 3%, Fluconazole 2%, Tea Tree Oil 5%, Urea 10%, Ibuprofen 2% in DMSO Suspension #80m. Apply to the affected nail(s) at bedtime. 4[facility], level knee high.  5. Patient to continue soft, supportive shoe gear. 6. Patient to report any pedal injuries to medical professional immediately. 7. Follow up 3 months.  8. Patient/POA to call should there be a concern in the interim.

## 2018-10-25 NOTE — Patient Instructions (Addendum)
Corns and Calluses Corns are small areas of thickened skin that occur on the top, sides, or tip of a toe. They contain a cone-shaped core with a point that can press on a nerve below. This causes pain.  Calluses are areas of thickened skin that can occur anywhere on the body, including the hands, fingers, palms, soles of the feet, and heels. Calluses are usually larger than corns. What are the causes? Corns and calluses are caused by rubbing (friction) or pressure, such as from shoes that are too tight or do not fit properly. What increases the risk? Corns are more likely to develop in people who have misshapen toes (toe deformities), such as hammer toes. Calluses can occur with friction to any area of the skin. They are more likely to develop in people who:  Work with their hands.  Wear shoes that fit poorly, are too tight, or are high-heeled.  Have toe deformities. What are the signs or symptoms? Symptoms of a corn or callus include:  A hard growth on the skin.  Pain or tenderness under the skin.  Redness and swelling.  Increased discomfort while wearing tight-fitting shoes, if your feet are affected. If a corn or callus becomes infected, symptoms may include:  Redness and swelling that gets worse.  Pain.  Fluid, blood, or pus draining from the corn or callus. How is this diagnosed? Corns and calluses may be diagnosed based on your symptoms, your medical history, and a physical exam. How is this treated? Treatment for corns and calluses may include:  Removing the cause of the friction or pressure. This may involve: ? Changing your shoes. ? Wearing shoe inserts (orthotics) or other protective layers in your shoes, such as a corn pad. ? Wearing gloves.  Applying medicine to the skin (topical medicine) to help soften skin in the hardened, thickened areas.  Removing layers of dead skin with a file to reduce the size of the corn or callus.  Removing the corn or callus with a  scalpel or laser.  Taking antibiotic medicines, if your corn or callus is infected.  Having surgery, if a toe deformity is the cause. Follow these instructions at home:   Take over-the-counter and prescription medicines only as told by your health care provider.  If you were prescribed an antibiotic, take it as told by your health care provider. Do not stop taking it even if your condition starts to improve.  Wear shoes that fit well. Avoid wearing high-heeled shoes and shoes that are too tight or too loose.  Wear any padding, protective layers, gloves, or orthotics as told by your health care provider.  Soak your hands or feet and then use a file or pumice stone to soften your corn or callus. Do this as told by your health care provider.  Check your corn or callus every day for symptoms of infection. Contact a health care provider if you:  Notice that your symptoms do not improve with treatment.  Have redness or swelling that gets worse.  Notice that your corn or callus becomes painful.  Have fluid, blood, or pus coming from your corn or callus.  Have new symptoms. Summary  Corns are small areas of thickened skin that occur on the top, sides, or tip of a toe.  Calluses are areas of thickened skin that can occur anywhere on the body, including the hands, fingers, palms, and soles of the feet. Calluses are usually larger than corns.  Corns and calluses are caused by   rubbing (friction) or pressure, such as from shoes that are too tight or do not fit properly.  Treatment may include wearing any padding, protective layers, gloves, or orthotics as told by your health care provider. This information is not intended to replace advice given to you by your health care provider. Make sure you discuss any questions you have with your health care provider. Document Released: 10/09/2003 Document Revised: 04/24/2018 Document Reviewed: 11/15/2016 Elsevier Patient Education  [place].  Diabetes Mellitus and [place] care is an important part of your health, especially when you have diabetes. Diabetes may cause you to have problems because of poor blood flow (circulation) to your feet and legs, which can cause your skin to:  Become thinner and drier.  Break more easily.  Heal more slowly.  Peel and crack. You may also have nerve damage (neuropathy) in your legs and feet, causing decreased feeling in them. This means that you may not notice minor injuries to your feet that could lead to more serious problems. Noticing and addressing any potential problems early is the best way to prevent future foot problems. How to care for your feet Foot hygiene  Wash your feet daily with warm water and mild soap. Do not use hot water. Then, pat your feet and the areas between your toes until they are completely dry. Do not soak your feet as this can dry your skin.  Trim your toenails straight across. Do not dig under them or around the cuticle. File the edges of your nails with an emery board or nail file.  Apply a moisturizing lotion or petroleum jelly to the skin on your feet and to dry, brittle toenails. Use lotion that does not contain alcohol and is unscented. Do not apply lotion between your toes. Shoes and socks  Wear clean socks or stockings every day. Make sure they are not too tight. Do not wear knee-high stockings since they may decrease blood flow to your legs.  Wear shoes that fit properly and have enough cushioning. Always look in your shoes before you put them on to be sure there are no objects inside.  To break in new shoes, wear them for just a few hours a day. This prevents injuries on your feet. Wounds, scrapes, corns, and calluses  Check your feet daily for blisters, cuts, bruises, sores, and redness. If you cannot see the bottom of your feet, use a mirror or ask someone for help.  Do not cut corns or calluses or try to remove them with medicine.   If you find a minor scrape, cut, or break in the skin on your feet, keep it and the skin around it clean and dry. You may clean these areas with mild soap and water. Do not clean the area with peroxide, alcohol, or iodine.  If you have a wound, scrape, corn, or callus on your foot, look at it several times a day to make sure it is healing and not infected. Check for: ? Redness, swelling, or pain. ? Fluid or blood. ? Warmth. ? Pus or a bad smell. General instructions  Do not cross your legs. This may decrease blood flow to your feet.  Do not use heating pads or hot water bottles on your feet. They may burn your skin. If you have lost feeling in your feet or legs, you may not know this is happening until it is too late.  Protect your feet from hot and cold by wearing shoes,  such as at the beach or on hot pavement.  Schedule a complete foot exam at least once a year (annually) or more often if you have foot problems. If you have foot problems, report any cuts, sores, or bruises to your health care provider immediately. Contact a health care provider if:  You have a medical condition that increases your risk of infection and you have any cuts, sores, or bruises on your feet.  You have an injury that is not healing.  You have redness on your legs or feet.  You feel burning or tingling in your legs or feet.  You have pain or cramps in your legs and feet.  Your legs or feet are numb.  Your feet always feel cold.  You have pain around a toenail. Get help right away if:  You have a wound, scrape, corn, or callus on your foot and: ? You have pain, swelling, or redness that gets worse. ? You have fluid or blood coming from the wound, scrape, corn, or callus. ? Your wound, scrape, corn, or callus feels warm to the touch. ? You have pus or a bad smell coming from the wound, scrape, corn, or callus. ? You have a fever. ? You have a red line going up your leg. Summary  Check your feet  every day for cuts, sores, red spots, swelling, and blisters.  Moisturize feet and legs daily.  Wear shoes that fit properly and have enough cushioning.  If you have foot problems, report any cuts, sores, or bruises to your health care provider immediately.  Schedule a complete foot exam at least once a year (annually) or more often if you have foot problems. This information is not intended to replace advice given to you by your health care provider. Make sure you discuss any questions you have with your health care provider. Document Released: 12/31/1999 Document Revised: 02/14/2017 Document Reviewed: 02/04/2016 Elsevier Patient Education  [place]? An infection that lies within the keratin of your nail plate that is caused by a fungus.  WHY ME? Fungal infections affect all ages, sexes, races, and creeds.  There may be many factors that predispose you to a fungal infection such as age, coexisting medical conditions such as diabetes, or an autoimmune disease; stress, medications, fatigue, genetics, etc.  Bottom line: fungus thrives in a warm, moist environment and your shoes offer such a location.  IS IT CONTAGIOUS? Theoretically, yes.  You do not want to share shoes, nail clippers or files with someone who has fungal toenails.  Walking around barefoot in the same room or sleeping in the same bed is unlikely to transfer the organism.  It is important to realize, however, that fungus can spread easily from one nail to the next on the same foot.  HOW DO WE TREAT THIS?  There are several ways to treat this condition.  Treatment may depend on many factors such as age, medications, pregnancy, liver and kidney conditions, etc.  It is best to ask your doctor which options are available to you.  1. No treatment.   Unlike many other medical concerns, you can live with this condition.  However for many people this can be a painful condition and may  lead to ingrown toenails or a bacterial infection.  It is recommended that you keep the nails cut short to help reduce the amount of fungal nail. 2. Topical treatment.  These range from herbal remedies to prescription  strength nail lacquers.  About 40-50% effective, topicals require twice daily application for approximately 9 to 12 months or until an entirely new nail has grown out.  The most effective topicals are medical grade medications available through physicians offices. 3. Oral antifungal medications.  With an 80-90% cure rate, the most common oral medication requires 3 to 4 months of therapy and stays in your system for a year as the new nail grows out.  Oral antifungal medications do require blood work to make sure it is a safe drug for you.  A liver function panel will be performed prior to starting the medication and after the first month of treatment.  It is important to have the blood work performed to avoid any harmful side effects.  In general, this medication safe but blood work is required. 4. Laser Therapy.  This treatment is performed by applying a specialized laser to the affected nail plate.  This therapy is noninvasive, fast, and non-painful.  It is not covered by insurance and is therefore, out of pocket.  The results have been very good with a 80-95% cure rate.  [facility] is the only practice in the area to offer this therapy. 5. Permanent Nail Avulsion.  Removing the entire nail so that a new nail will not grow back. 

## 2018-10-27 ENCOUNTER — Encounter: Payer: Self-pay | Admitting: Podiatry

## 2018-11-14 ENCOUNTER — Other Ambulatory Visit: Payer: Self-pay | Admitting: Nurse Practitioner

## 2018-12-23 ENCOUNTER — Other Ambulatory Visit: Payer: Self-pay

## 2018-12-23 DIAGNOSIS — Z20822 Contact with and (suspected) exposure to covid-19: Secondary | ICD-10-CM

## 2018-12-24 LAB — NOVEL CORONAVIRUS, NAA: SARS-CoV-2, NAA: NOT DETECTED

## 2018-12-27 ENCOUNTER — Other Ambulatory Visit: Payer: BC Managed Care – PPO

## 2018-12-27 ENCOUNTER — Other Ambulatory Visit: Payer: Self-pay

## 2018-12-27 DIAGNOSIS — I10 Essential (primary) hypertension: Secondary | ICD-10-CM

## 2018-12-27 DIAGNOSIS — E1122 Type 2 diabetes mellitus with diabetic chronic kidney disease: Secondary | ICD-10-CM

## 2018-12-27 DIAGNOSIS — E890 Postprocedural hypothyroidism: Secondary | ICD-10-CM

## 2018-12-27 DIAGNOSIS — N182 Chronic kidney disease, stage 2 (mild): Secondary | ICD-10-CM

## 2018-12-27 DIAGNOSIS — Z794 Long term (current) use of insulin: Secondary | ICD-10-CM

## 2018-12-28 LAB — BASIC METABOLIC PANEL WITH GFR
BUN/Creatinine Ratio: 10 (calc) (ref 6–22)
BUN: 14 mg/dL (ref 7–25)
CO2: 29 mmol/L (ref 20–32)
Calcium: 9.4 mg/dL (ref 8.6–10.3)
Chloride: 104 mmol/L (ref 98–110)
Creat: 1.37 mg/dL — ABNORMAL HIGH (ref 0.70–1.25)
GFR, Est African American: 62 mL/min/{1.73_m2} (ref >=60)
GFR, Est Non African American: 53 mL/min/{1.73_m2} — ABNORMAL LOW (ref >=60)
Glucose, Bld: 87 mg/dL (ref 65–99)
Potassium: 4.2 mmol/L (ref 3.5–5.3)
Sodium: 141 mmol/L (ref 135–146)

## 2018-12-28 LAB — HEMOGLOBIN A1C
Hgb A1c MFr Bld: 6.1 % of total Hgb — ABNORMAL HIGH (ref <5.7)
Mean Plasma Glucose: 128 (calc)
eAG (mmol/L): 7.1 (calc)

## 2018-12-28 LAB — TSH: TSH: 6.04 mIU/L — ABNORMAL HIGH (ref 0.40–4.50)

## 2018-12-30 ENCOUNTER — Encounter: Payer: Self-pay | Admitting: Nurse Practitioner

## 2018-12-30 ENCOUNTER — Other Ambulatory Visit: Payer: Self-pay

## 2018-12-30 ENCOUNTER — Other Ambulatory Visit: Payer: Self-pay | Admitting: Nurse Practitioner

## 2018-12-30 ENCOUNTER — Ambulatory Visit (INDEPENDENT_AMBULATORY_CARE_PROVIDER_SITE_OTHER): Payer: BC Managed Care – PPO | Admitting: Nurse Practitioner

## 2018-12-30 VITALS — BP 130/70 | HR 54 | Temp 96.9°F | Ht 72.0 in | Wt 221.0 lb

## 2018-12-30 DIAGNOSIS — N529 Male erectile dysfunction, unspecified: Secondary | ICD-10-CM

## 2018-12-30 DIAGNOSIS — N182 Chronic kidney disease, stage 2 (mild): Secondary | ICD-10-CM

## 2018-12-30 DIAGNOSIS — E1122 Type 2 diabetes mellitus with diabetic chronic kidney disease: Secondary | ICD-10-CM

## 2018-12-30 DIAGNOSIS — Z794 Long term (current) use of insulin: Secondary | ICD-10-CM

## 2018-12-30 DIAGNOSIS — E785 Hyperlipidemia, unspecified: Secondary | ICD-10-CM | POA: Diagnosis not present

## 2018-12-30 DIAGNOSIS — E890 Postprocedural hypothyroidism: Secondary | ICD-10-CM | POA: Diagnosis not present

## 2018-12-30 NOTE — Patient Instructions (Signed)
Increase water intake Decrease coffee Increase physical activity   Follow up in 3 months with labs prior.  Decrease sodium in diet.  Blood pressure should be <140/90   DASH Eating Plan DASH stands for "Dietary Approaches to Stop Hypertension." The DASH eating plan is a healthy eating plan that has been shown to reduce high blood pressure (hypertension). It may also reduce your risk for type 2 diabetes, heart disease, and stroke. The DASH eating plan may also help with weight loss. What are tips for following this plan?  General guidelines  Avoid eating more than 2,300 mg (milligrams) of salt (sodium) a day. If you have hypertension, you may need to reduce your sodium intake to 1,500 mg a day.  Limit alcohol intake to no more than 1 drink a day for nonpregnant women and 2 drinks a day for men. One drink equals 12 oz of beer, 5 oz of wine, or 1 oz of hard liquor.  Work with your health care provider to maintain a healthy body weight or to lose weight. Ask what an ideal weight is for you.  Get at least 30 minutes of exercise that causes your heart to beat faster (aerobic exercise) most days of the week. Activities may include walking, swimming, or biking.  Work with your health care provider or diet and nutrition specialist (dietitian) to adjust your eating plan to your individual calorie needs. Reading food labels   Check food labels for the amount of sodium per serving. Choose foods with less than 5 percent of the Daily Value of sodium. Generally, foods with less than 300 mg of sodium per serving fit into this eating plan.  To find whole grains, look for the word "whole" as the first word in the ingredient list. Shopping  Buy products labeled as "low-sodium" or "no salt added."  Buy fresh foods. Avoid canned foods and premade or frozen meals. Cooking  Avoid adding salt when cooking. Use salt-free seasonings or herbs instead of table salt or sea salt. Check with your health care  provider or pharmacist before using salt substitutes.  Do not fry foods. Cook foods using healthy methods such as baking, boiling, grilling, and broiling instead.  Cook with heart-healthy oils, such as olive, canola, soybean, or sunflower oil. Meal planning  Eat a balanced diet that includes: ? 5 or more servings of fruits and vegetables each day. At each meal, try to fill half of your plate with fruits and vegetables. ? Up to 6-8 servings of whole grains each day. ? Less than 6 oz of lean meat, poultry, or fish each day. A 3-oz serving of meat is about the same size as a deck of cards. One egg equals 1 oz. ? 2 servings of low-fat dairy each day. ? A serving of nuts, seeds, or beans 5 times each week. ? Heart-healthy fats. Healthy fats called Omega-3 fatty acids are found in foods such as flaxseeds and coldwater fish, like sardines, salmon, and mackerel.  Limit how much you eat of the following: ? Canned or prepackaged foods. ? Food that is high in trans fat, such as fried foods. ? Food that is high in saturated fat, such as fatty meat. ? Sweets, desserts, sugary drinks, and other foods with added sugar. ? Full-fat dairy products.  Do not salt foods before eating.  Try to eat at least 2 vegetarian meals each week.  Eat more home-cooked food and less restaurant, buffet, and fast food.  When eating at a restaurant, ask  that your food be prepared with less salt or no salt, if possible. What foods are recommended? The items listed may not be a complete list. Talk with your dietitian about what dietary choices are best for you. Grains Whole-grain or whole-wheat bread. Whole-grain or whole-wheat pasta. Brown rice. [place]. Bulgur. Whole-grain and low-sodium cereals. Pita bread. Low-fat, low-sodium crackers. Whole-wheat flour tortillas. Vegetables Fresh or frozen vegetables (raw, steamed, roasted, or grilled). Low-sodium or reduced-sodium tomato and vegetable juice. Low-sodium or  reduced-sodium tomato sauce and tomato paste. Low-sodium or reduced-sodium canned vegetables. Fruits All fresh, dried, or frozen fruit. Canned fruit in natural juice (without added sugar). Meat and other protein foods Skinless chicken or [place]. Ground chicken or [place]. Pork with fat trimmed off. Fish and seafood. Egg whites. Dried beans, peas, or lentils. Unsalted nuts, nut butters, and seeds. Unsalted canned beans. Lean cuts of beef with fat trimmed off. Low-sodium, lean deli meat. Dairy Low-fat (1%) or fat-free (skim) milk. Fat-free, low-fat, or reduced-fat cheeses. Nonfat, low-sodium ricotta or cottage cheese. Low-fat or nonfat yogurt. Low-fat, low-sodium cheese. Fats and oils Soft margarine without trans fats. Vegetable oil. Low-fat, reduced-fat, or light mayonnaise and salad dressings (reduced-sodium). Canola, safflower, olive, soybean, and sunflower oils. Avocado. Seasoning and other foods Herbs. Spices. Seasoning mixes without salt. Unsalted popcorn and pretzels. Fat-free sweets. What foods are not recommended? The items listed may not be a complete list. Talk with your dietitian about what dietary choices are best for you. Grains Baked goods made with fat, such as croissants, muffins, or some breads. Dry pasta or rice meal packs. Vegetables Creamed or fried vegetables. Vegetables in a cheese sauce. Regular canned vegetables (not low-sodium or reduced-sodium). Regular canned tomato sauce and paste (not low-sodium or reduced-sodium). Regular tomato and vegetable juice (not low-sodium or reduced-sodium). Angie Fava. Olives. Fruits Canned fruit in a light or heavy syrup. Fried fruit. Fruit in cream or butter sauce. Meat and other protein foods Fatty cuts of meat. Ribs. Fried meat. Berniece Salines. Sausage. Bologna and other processed lunch meats. Salami. Fatback. Hotdogs. Bratwurst. Salted nuts and seeds. Canned beans with added salt. Canned or smoked fish. Whole eggs or egg yolks. Chicken or [place]  with skin. Dairy Whole or 2% milk, cream, and half-and-half. Whole or full-fat cream cheese. Whole-fat or sweetened yogurt. Full-fat cheese. Nondairy creamers. Whipped toppings. Processed cheese and cheese spreads. Fats and oils Butter. Stick margarine. Lard. Shortening. Ghee. Bacon fat. Tropical oils, such as coconut, palm kernel, or palm oil. Seasoning and other foods Salted popcorn and pretzels. Onion salt, garlic salt, seasoned salt, table salt, and sea salt. Worcestershire sauce. Tartar sauce. Barbecue sauce. Teriyaki sauce. Soy sauce, including reduced-sodium. Steak sauce. Canned and packaged gravies. Fish sauce. Oyster sauce. Cocktail sauce. Horseradish that you find on the shelf. Ketchup. Mustard. Meat flavorings and tenderizers. Bouillon cubes. Hot sauce and Tabasco sauce. Premade or packaged marinades. Premade or packaged taco seasonings. Relishes. Regular salad dressings. Where to find more information:  National Heart, Lung, and [place]: https://wilson-eaton.com/  American Heart Association: www.heart.org Summary  The DASH eating plan is a healthy eating plan that has been shown to reduce high blood pressure (hypertension). It may also reduce your risk for type 2 diabetes, heart disease, and stroke.  With the DASH eating plan, you should limit salt (sodium) intake to 2,300 mg a day. If you have hypertension, you may need to reduce your sodium intake to 1,500 mg a day.  When on the DASH eating plan, aim to eat more fresh fruits  and vegetables, whole grains, lean proteins, low-fat dairy, and heart-healthy fats.  Work with your health care provider or diet and nutrition specialist (dietitian) to adjust your eating plan to your individual calorie needs. This information is not intended to replace advice given to you by your health care provider. Make sure you discuss any questions you have with your health care provider. Document Released: 12/22/2010 Document Revised: 12/15/2016  Document Reviewed: 12/27/2015 Elsevier Patient Education  2020 [employer].

## 2018-12-30 NOTE — Progress Notes (Signed)
Careteam: Patient Care Team: [name], NP as PCP - General (Geriatric Medicine) Leighton Ruff, [place] (Optometry)  Advanced Directive information    No Known Allergies  Chief Complaint  Patient presents with  . Medical Management of Chronic Issues    3 month follow-up and discuss labs      HPI: Patient is a 68 y.o. male seen in the office today for routine follow up.   DM- no low blood sugars. a1c 6.1. Gained weight over thanksgiving.  htn- does not check blood pressure at home. Sodium intake has been up the last few days.   Hypothyroid - takes synthroid at the same time daily. No recent change in medication. TSH 6.0  CKD stage 2- drinking more coffee  BPPV- no recent dizziness  ED- continues on viagra PRN.  Review of Systems:  Review of Systems  Constitutional: Negative for chills, fever and weight loss.  HENT: Negative for hearing loss.   Respiratory: Negative for cough, sputum production and shortness of breath.   Cardiovascular: Negative for chest pain, palpitations and leg swelling.  Gastrointestinal: Negative for abdominal pain, constipation, diarrhea and heartburn.  Genitourinary: Negative for dysuria, frequency and urgency.  Musculoskeletal: Negative for back pain, falls, joint pain and myalgias.  Skin: Negative.   Neurological: Negative for dizziness and headaches.  Psychiatric/Behavioral: Negative for depression and memory loss. The patient does not have insomnia.     Past Medical History:  Diagnosis Date  . Bell's palsy    2005  . Carpal tunnel syndrome   . Cataract   . CKD stage 2 due to type 2 diabetes mellitus ([place]) 12/01/2015  . Diabetes mellitus without complication ([place])   . Edema   . Gout, unspecified   . Hyperlipidemia   . Hypertension   . Internal hemorrhoids without mention of complication   . Malignant neoplasm of thyroid gland ([place])   . Memory loss   . 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 Obesity, unspecified   .  Other malaise and fatigue   . Other psoriasis   . Sleep apnea    no CPAP used 09-23-15  . Supraventricular premature beats   . Thyroid disease   . Trigger finger (acquired)   . Unspecified sleep apnea    Past Surgical History:  Procedure Laterality Date  . CATARACT EXTRACTION W/ INTRAOCULAR LENS IMPLANT Right 06/05/2016   Dr. Arlina Robes  . COLONOSCOPY  10/07/2015   one polyp, Dr. Loletha Carrow  . TOTAL THYROIDECTOMY  2007   Dr. Georgina Quint   Social History:   reports that he quit smoking about 40 years ago. He has never used smokeless tobacco. He reports current alcohol use. He reports that he does not use drugs.  Family History  Problem Relation Age of Onset  . Cancer - Prostate Father   . Stomach cancer Father   . Hypertension Other   . Colon cancer Neg Hx   . Esophageal cancer Neg Hx   . Rectal cancer Neg Hx     Medications: Patient's Medications  New Prescriptions   No medications on file  Previous Medications   ASPIRIN EC 81 MG TABLET    Take 81 mg by mouth daily.   BLOOD GLUCOSE MONITORING SUPPL (ONE TOUCH ULTRA MINI) W/DEVICE KIT    Check blood sugar once daily as directed E11.22   CYANOCOBALAMIN (B-12) 1000 MCG TABS    Take by mouth daily.   GLUCOSE BLOOD (ONE TOUCH ULTRA TEST) TEST STRIP  TEST 2 TIMES DAILY FOR DIABETES   INSULIN DETEMIR (LEVEMIR FLEXPEN) 100 UNIT/ML PEN    Inject 44 Units into the skin daily at 10 pm.   INSULIN PEN NEEDLE (B-D ULTRAFINE III SHORT PEN) 31G X 8 MM MISC    Use for insulin injections. Dx: E11.22   LEVOTHYROXINE (SYNTHROID) 112 MCG TABLET    TAKE 1 TABLET (112 MCG TOTAL) BY MOUTH DAILY BEFORE BREAKFAST.   LOSARTAN (COZAAR) 100 MG TABLET    Take 1 tablet (100 mg total) by mouth daily.   LOVASTATIN (MEVACOR) 40 MG TABLET    TAKE 1 TABLET DAILY TO CONTROL CHOLESTEROL   METFORMIN (GLUCOPHAGE) 500 MG TABLET    Take one tablet by mouth before breakfast and one tablet before supper to control blood sugar.   NONFORMULARY OR COMPOUNDED ITEM     Antifungal solution: Terbinafine 3%, Fluconazole 2%, Tea Tree Oil 5%, Urea 10%, Ibuprofen 2% in DMSO suspension #[id number]   ONETOUCH DELICA LANCETS FINE MISC    Use to check blood sugar twice daily.   SILDENAFIL (VIAGRA) 100 MG TABLET    TAKE ONE TABLET BY MOUTH DAILY AS NEEDED FOR  ERECTILE DYSFUNCTION   VITAMIN C (ASCORBIC ACID) 250 MG TABLET    Take 500 mg by mouth daily.  Modified Medications   No medications on file  Discontinued Medications   No medications on file    Physical Exam:  Vitals:   12/30/18 0837 12/30/18 1418  BP: (!) 142/86 130/70  Pulse: (!) 54   Temp: (!) 96.9 F (36.1 C)   TempSrc: Temporal   SpO2: 99%   Weight: 221 lb (100.2 kg)   Height: 6' (1.829 m)    Body mass index is 29.97 kg/m. Wt Readings from Last 3 Encounters:  12/30/18 221 lb (100.2 kg)  09/30/18 216 lb (98 kg)  06/21/18 218 lb (98.9 kg)    Physical Exam Constitutional:      General: He is not in acute distress.    Appearance: He is well-developed. He is not diaphoretic.  HENT:     Head: Normocephalic and atraumatic.  Eyes:     Conjunctiva/sclera: Conjunctivae normal.     Pupils: Pupils are equal, round, and reactive to light.  Cardiovascular:     Rate and Rhythm: Normal rate and regular rhythm.     Heart sounds: Normal heart sounds.  Pulmonary:     Effort: Pulmonary effort is normal.     Breath sounds: Normal breath sounds.  Abdominal:     General: Bowel sounds are normal.     Palpations: Abdomen is soft.  Musculoskeletal:        General: No tenderness.     Cervical back: Normal range of motion and neck supple.  Skin:    General: Skin is warm and dry.  Neurological:     Mental Status: He is alert and oriented to person, place, and time.  Psychiatric:        Mood and Affect: Mood normal.        Behavior: Behavior normal.     Labs reviewed: Basic Metabolic Panel: Recent Labs    03/18/18 1527 05/14/18 0837 06/21/18 1015 09/30/18 1011 12/27/18 0850  NA 137  --  140 139  141  K 3.7  --  4.1 4.1 4.2  CL 100  --  104 105 104  CO2 30  --  '29 30 29  '$ GLUCOSE 75  --  114* 109* 87  BUN 18  --  18  16 14  CREATININE 1.34*  --  1.38* 1.09 1.37*  CALCIUM 9.9  --  9.2 9.4 9.4  TSH 0.25* 0.94  --   --  6.04*   Liver Function Tests: Recent Labs    03/18/18 1527 09/30/18 1011  AST 16 14  ALT 11 11  BILITOT 0.5 0.3  PROT 7.1 6.6   No results for input(s): LIPASE, AMYLASE in the last 8760 hours. No results for input(s): AMMONIA in the last 8760 hours. CBC: Recent Labs    03/18/18 1527 09/30/18 1011  WBC 3.9 3.3*  NEUTROABS 1,997 1,785  HGB 12.7* 12.5*  HCT 37.7* 38.1*  MCV 80.6 82.8  PLT 267 234   Lipid Panel: Recent Labs    03/18/18 1527 09/30/18 1011  CHOL 147 145  HDL 60 60  LDLCALC 73 71  TRIG 61 55  CHOLHDL 2.5 2.4   TSH: Recent Labs    03/18/18 1527 05/14/18 0837 12/27/18 0850  TSH 0.25* 0.94 6.04*   A1C: Lab Results  Component Value Date   HGBA1C 6.1 (H) 12/27/2018     Assessment/Plan 1. Hyperlipidemia, unspecified hyperlipidemia type -continue on heart healthy diet with lovastatin - CMP with eGFR([facility]); Future - Lipid Panel; Future  2. Erectile dysfunction, unspecified erectile dysfunction type -continues on viagra PRN.   3. Postoperative hypothyroidism -TSH slightly elevated, previously on the low side, reports consistency with taking medication. Will continue on current dose and follow up TSH  - TSH; Future  4. Type 2 diabetes mellitus with stage 2 chronic kidney disease, with long-term current use of insulin ([place]) -reports blood sugars have been consistent without highs or lows.   5. CKD stage 2 due to type 2 diabetes mellitus ([place]) -Encourage proper hydration and to avoid NSAIDS (Aleve, Advil, Motrin, Ibuprofen)  - CMP with eGFR([facility]); Future - Hemoglobin A1c; Future - CBC with Differential/Platelet; Future  Next appt: 3 months with lab work prior to appt.  Carlos American. [place], [facility] [phone]

## 2019-01-02 ENCOUNTER — Other Ambulatory Visit: Payer: Self-pay | Admitting: Nurse Practitioner

## 2019-01-31 ENCOUNTER — Ambulatory Visit (INDEPENDENT_AMBULATORY_CARE_PROVIDER_SITE_OTHER): Payer: Medicare PPO | Admitting: Podiatry

## 2019-01-31 ENCOUNTER — Other Ambulatory Visit: Payer: Self-pay

## 2019-01-31 ENCOUNTER — Encounter: Payer: Self-pay | Admitting: Podiatry

## 2019-01-31 DIAGNOSIS — L84 Corns and callosities: Secondary | ICD-10-CM

## 2019-01-31 DIAGNOSIS — B351 Tinea unguium: Secondary | ICD-10-CM

## 2019-01-31 DIAGNOSIS — M79674 Pain in right toe(s): Secondary | ICD-10-CM

## 2019-01-31 DIAGNOSIS — Z794 Long term (current) use of insulin: Secondary | ICD-10-CM | POA: Diagnosis not present

## 2019-01-31 DIAGNOSIS — E1122 Type 2 diabetes mellitus with diabetic chronic kidney disease: Secondary | ICD-10-CM | POA: Diagnosis not present

## 2019-01-31 DIAGNOSIS — N182 Chronic kidney disease, stage 2 (mild): Secondary | ICD-10-CM

## 2019-01-31 DIAGNOSIS — M79675 Pain in left toe(s): Secondary | ICD-10-CM

## 2019-01-31 NOTE — Patient Instructions (Signed)

## 2019-02-06 NOTE — Progress Notes (Signed)
Subjective: Mitchell Gibbs presents today for preventative diabetic foot care. Patient is seen for follow up of painful, mycotic toenails which interfere with comfortable ambulation when wearing enclosed shoe gear. Pain is relieved with periodic professional debridement.  He voices no new pedal problems on today's visit.  Medications reviewed in chart.  No Known Allergies  Objective: There were no vitals filed for this visit.  Vascular Examination: Capillary refill time immediate x 10 digits.  Dorsalis pedis present b/l.  Posterior tibial pulses present b/l.  Digital hair remains sparse b/l.  Skin temperature gradient WNL b/l.   Dermatological Examination: Skin with normal turgor, texture and tone b/l.  Toenails 1-5 b/l discolored, thick, dystrophic with subungual debris and pain with palpation to nailbeds due to thickness of nails.  Hyperkeratotic lesion submet head  2 left foot, submet head 5 right foot and b/l hallux with tenderness to palpation. No edema, no erythema, no drainage, no flocculence.  Musculoskeletal: Muscle strength 5/5 b/l to all LE muscle groups.  Gross bony deformities:  HAV with bunion b/l and hammertoes 2-5 b/l.  No pain, crepitus or joint limitation with passive/active ROM b/l.  Neurological Examination: Protective sensation intact 5/5 b/l with 10 gram monofilament.  Vibratory sensation intact bilaterally.   Assessment: 1. Painful onychomycosis toenails 1-5 b/l 2. Calluses submet head 2 left foot and submet head 5 right foot 3. NIDDM  Plan: 1. Continue diabetic foot care principles. Literature dispensed on today. 2. Toenails 1-5 b/l were debrided in length and girth without iatrogenic bleeding. 3. Calluses pared submetatarsal head(s) 2 left foot, submet head 5 right foot utilizing sterile scalpel blade without incident. 4. Patient to continue soft, supportive shoe gear. 5. Patient to report any pedal injuries to medical  professional. 6. Follow up 3 months.  7. Patient/POA to call should there be a concern in the interim.

## 2019-02-15 ENCOUNTER — Other Ambulatory Visit: Payer: Self-pay | Admitting: Nurse Practitioner

## 2019-03-14 ENCOUNTER — Other Ambulatory Visit: Payer: Self-pay

## 2019-03-14 DIAGNOSIS — E1122 Type 2 diabetes mellitus with diabetic chronic kidney disease: Secondary | ICD-10-CM

## 2019-03-14 DIAGNOSIS — N182 Chronic kidney disease, stage 2 (mild): Secondary | ICD-10-CM

## 2019-03-14 MED ORDER — INSULIN DETEMIR 100 UNIT/ML FLEXPEN: 44.0000 [IU] | PEN_INJECTOR | Freq: Every day | SUBCUTANEOUS | 6 refills | Status: DC

## 2019-03-30 ENCOUNTER — Other Ambulatory Visit: Payer: Self-pay | Admitting: Nurse Practitioner

## 2019-03-30 DIAGNOSIS — E1122 Type 2 diabetes mellitus with diabetic chronic kidney disease: Secondary | ICD-10-CM

## 2019-03-30 DIAGNOSIS — Z794 Long term (current) use of insulin: Secondary | ICD-10-CM

## 2019-03-31 ENCOUNTER — Other Ambulatory Visit: Payer: Self-pay

## 2019-03-31 ENCOUNTER — Other Ambulatory Visit: Payer: Medicare PPO

## 2019-03-31 DIAGNOSIS — E890 Postprocedural hypothyroidism: Secondary | ICD-10-CM

## 2019-03-31 DIAGNOSIS — N182 Chronic kidney disease, stage 2 (mild): Secondary | ICD-10-CM

## 2019-03-31 DIAGNOSIS — E785 Hyperlipidemia, unspecified: Secondary | ICD-10-CM

## 2019-03-31 DIAGNOSIS — E1122 Type 2 diabetes mellitus with diabetic chronic kidney disease: Secondary | ICD-10-CM

## 2019-03-31 MED ORDER — METFORMIN HCL 500 MG PO TABS: ORAL_TABLET | ORAL | 0 refills | Status: DC

## 2019-04-01 LAB — COMPLETE METABOLIC PANEL WITH GFR
AG Ratio: 1.2 (calc) (ref 1.0–2.5)
ALT: 10 U/L (ref 9–46)
AST: 13 U/L (ref 10–35)
Albumin: 3.6 g/dL (ref 3.6–5.1)
Alkaline phosphatase (APISO): 52 U/L (ref 35–144)
BUN: 13 mg/dL (ref 7–25)
CO2: 28 mmol/L (ref 20–32)
Calcium: 9.2 mg/dL (ref 8.6–10.3)
Chloride: 105 mmol/L (ref 98–110)
Creat: 1.17 mg/dL (ref 0.70–1.25)
GFR, Est African American: 75 mL/min/{1.73_m2} (ref >=60)
GFR, Est Non African American: 65 mL/min/{1.73_m2} (ref >=60)
Globulin: 3 g/dL (calc) (ref 1.9–3.7)
Glucose, Bld: 101 mg/dL — ABNORMAL HIGH (ref 65–99)
Potassium: 4 mmol/L (ref 3.5–5.3)
Sodium: 140 mmol/L (ref 135–146)
Total Bilirubin: 0.4 mg/dL (ref 0.2–1.2)
Total Protein: 6.6 g/dL (ref 6.1–8.1)

## 2019-04-01 LAB — HEMOGLOBIN A1C
Hgb A1c MFr Bld: 6.3 % of total Hgb — ABNORMAL HIGH (ref <5.7)
Mean Plasma Glucose: 134 (calc)
eAG (mmol/L): 7.4 (calc)

## 2019-04-01 LAB — LIPID PANEL
Cholesterol: 144 mg/dL (ref <200)
HDL: 63 mg/dL (ref >=40)
LDL Cholesterol (Calc): 68 mg/dL (calc)
Non-HDL Cholesterol (Calc): 81 mg/dL (calc) (ref <130)
Total CHOL/HDL Ratio: 2.3 (calc) (ref <5.0)
Triglycerides: 54 mg/dL (ref <150)

## 2019-04-01 LAB — CBC WITH DIFFERENTIAL/PLATELET
Absolute Monocytes: 343 cells/uL (ref 200–950)
Basophils Absolute: 21 cells/uL (ref 0–200)
Basophils Relative: 0.6 %
Eosinophils Absolute: 151 cells/uL (ref 15–500)
Eosinophils Relative: 4.3 %
HCT: 38.7 % (ref 38.5–50.0)
Hemoglobin: 12.6 g/dL — ABNORMAL LOW (ref 13.2–17.1)
Lymphs Abs: 1194 cells/uL (ref 850–3900)
MCH: 27 pg (ref 27.0–33.0)
MCHC: 32.6 g/dL (ref 32.0–36.0)
MCV: 83 fL (ref 80.0–100.0)
MPV: 9.7 fL (ref 7.5–12.5)
Monocytes Relative: 9.8 %
Neutro Abs: 1792 cells/uL (ref 1500–7800)
Neutrophils Relative %: 51.2 %
Platelets: 214 10*3/uL (ref 140–400)
RBC: 4.66 10*6/uL (ref 4.20–5.80)
RDW: 14.1 % (ref 11.0–15.0)
Total Lymphocyte: 34.1 %
WBC: 3.5 10*3/uL — ABNORMAL LOW (ref 3.8–10.8)

## 2019-04-01 LAB — TSH: TSH: 3.68 mIU/L (ref 0.40–4.50)

## 2019-04-04 ENCOUNTER — Other Ambulatory Visit: Payer: Self-pay

## 2019-04-04 ENCOUNTER — Ambulatory Visit (INDEPENDENT_AMBULATORY_CARE_PROVIDER_SITE_OTHER): Payer: Medicare PPO | Admitting: Nurse Practitioner

## 2019-04-04 ENCOUNTER — Encounter: Payer: Self-pay | Admitting: Nurse Practitioner

## 2019-04-04 VITALS — BP 158/90 | HR 59 | Temp 96.8°F | Ht 72.0 in | Wt 225.0 lb

## 2019-04-04 DIAGNOSIS — Z23 Encounter for immunization: Secondary | ICD-10-CM | POA: Diagnosis not present

## 2019-04-04 DIAGNOSIS — I872 Venous insufficiency (chronic) (peripheral): Secondary | ICD-10-CM

## 2019-04-04 DIAGNOSIS — E890 Postprocedural hypothyroidism: Secondary | ICD-10-CM | POA: Diagnosis not present

## 2019-04-04 DIAGNOSIS — I10 Essential (primary) hypertension: Secondary | ICD-10-CM

## 2019-04-04 DIAGNOSIS — N182 Chronic kidney disease, stage 2 (mild): Secondary | ICD-10-CM

## 2019-04-04 DIAGNOSIS — E782 Mixed hyperlipidemia: Secondary | ICD-10-CM | POA: Diagnosis not present

## 2019-04-04 DIAGNOSIS — R3915 Urgency of urination: Secondary | ICD-10-CM

## 2019-04-04 DIAGNOSIS — Z794 Long term (current) use of insulin: Secondary | ICD-10-CM

## 2019-04-04 DIAGNOSIS — E1122 Type 2 diabetes mellitus with diabetic chronic kidney disease: Secondary | ICD-10-CM | POA: Diagnosis not present

## 2019-04-04 DIAGNOSIS — K5901 Slow transit constipation: Secondary | ICD-10-CM

## 2019-04-04 DIAGNOSIS — Z7189 Other specified counseling: Secondary | ICD-10-CM

## 2019-04-04 NOTE — Patient Instructions (Addendum)
4 week for MOST form completion and follow up BP  Take blood pressure twice weekly after medication and make sure you have been sitting and relaxed bp should be <140/90 Continue to stay hydrated Low sodium diet.      DASH Eating Plan DASH stands for "Dietary Approaches to Stop Hypertension." The DASH eating plan is a healthy eating plan that has been shown to reduce high blood pressure (hypertension). It may also reduce your risk for type 2 diabetes, heart disease, and stroke. The DASH eating plan may also help with weight loss. What are tips for following this plan?  General guidelines  Avoid eating more than 2,300 mg (milligrams) of salt (sodium) a day. If you have hypertension, you may need to reduce your sodium intake to 1,500 mg a day.  Limit alcohol intake to no more than 1 drink a day for nonpregnant women and 2 drinks a day for men. One drink equals 12 oz of beer, 5 oz of wine, or 1 oz of hard liquor.  Work with your health care provider to maintain a healthy body weight or to lose weight. Ask what an ideal weight is for you.  Get at least 30 minutes of exercise that causes your heart to beat faster (aerobic exercise) most days of the week. Activities may include walking, swimming, or biking.  Work with your health care provider or diet and nutrition specialist (dietitian) to adjust your eating plan to your individual calorie needs. Reading food labels   Check food labels for the amount of sodium per serving. Choose foods with less than 5 percent of the Daily Value of sodium. Generally, foods with less than 300 mg of sodium per serving fit into this eating plan.  To find whole grains, look for the word "whole" as the first word in the ingredient list. Shopping  Buy products labeled as "low-sodium" or "no salt added."  Buy fresh foods. Avoid canned foods and premade or frozen meals. Cooking  Avoid adding salt when cooking. Use salt-free seasonings or herbs instead of  table salt or sea salt. Check with your health care provider or pharmacist before using salt substitutes.  Do not fry foods. Cook foods using healthy methods such as baking, boiling, grilling, and broiling instead.  Cook with heart-healthy oils, such as olive, canola, soybean, or sunflower oil. Meal planning  Eat a balanced diet that includes: ? 5 or more servings of fruits and vegetables each day. At each meal, try to fill half of your plate with fruits and vegetables. ? Up to 6-8 servings of whole grains each day. ? Less than 6 oz of lean meat, poultry, or fish each day. A 3-oz serving of meat is about the same size as a deck of cards. One egg equals 1 oz. ? 2 servings of low-fat dairy each day. ? A serving of nuts, seeds, or beans 5 times each week. ? Heart-healthy fats. Healthy fats called Omega-3 fatty acids are found in foods such as flaxseeds and coldwater fish, like sardines, salmon, and mackerel.  Limit how much you eat of the following: ? Canned or prepackaged foods. ? Food that is high in trans fat, such as fried foods. ? Food that is high in saturated fat, such as fatty meat. ? Sweets, desserts, sugary drinks, and other foods with added sugar. ? Full-fat dairy products.  Do not salt foods before eating.  Try to eat at least 2 vegetarian meals each week.  Eat more home-cooked food and less  restaurant, buffet, and fast food.  When eating at a restaurant, ask that your food be prepared with less salt or no salt, if possible. What foods are recommended? The items listed may not be a complete list. Talk with your dietitian about what dietary choices are best for you. Grains Whole-grain or whole-wheat bread. Whole-grain or whole-wheat pasta. Brown rice. [place]. Bulgur. Whole-grain and low-sodium cereals. Pita bread. Low-fat, low-sodium crackers. Whole-wheat flour tortillas. Vegetables Fresh or frozen vegetables (raw, steamed, roasted, or grilled). Low-sodium or  reduced-sodium tomato and vegetable juice. Low-sodium or reduced-sodium tomato sauce and tomato paste. Low-sodium or reduced-sodium canned vegetables. Fruits All fresh, dried, or frozen fruit. Canned fruit in natural juice (without added sugar). Meat and other protein foods Skinless chicken or [place]. Ground chicken or [place]. Pork with fat trimmed off. Fish and seafood. Egg whites. Dried beans, peas, or lentils. Unsalted nuts, nut butters, and seeds. Unsalted canned beans. Lean cuts of beef with fat trimmed off. Low-sodium, lean deli meat. Dairy Low-fat (1%) or fat-free (skim) milk. Fat-free, low-fat, or reduced-fat cheeses. Nonfat, low-sodium ricotta or cottage cheese. Low-fat or nonfat yogurt. Low-fat, low-sodium cheese. Fats and oils Soft margarine without trans fats. Vegetable oil. Low-fat, reduced-fat, or light mayonnaise and salad dressings (reduced-sodium). Canola, safflower, olive, soybean, and sunflower oils. Avocado. Seasoning and other foods Herbs. Spices. Seasoning mixes without salt. Unsalted popcorn and pretzels. Fat-free sweets. What foods are not recommended? The items listed may not be a complete list. Talk with your dietitian about what dietary choices are best for you. Grains Baked goods made with fat, such as croissants, muffins, or some breads. Dry pasta or rice meal packs. Vegetables Creamed or fried vegetables. Vegetables in a cheese sauce. Regular canned vegetables (not low-sodium or reduced-sodium). Regular canned tomato sauce and paste (not low-sodium or reduced-sodium). Regular tomato and vegetable juice (not low-sodium or reduced-sodium). Angie Fava. Olives. Fruits Canned fruit in a light or heavy syrup. Fried fruit. Fruit in cream or butter sauce. Meat and other protein foods Fatty cuts of meat. Ribs. Fried meat. Berniece Salines. Sausage. Bologna and other processed lunch meats. Salami. Fatback. Hotdogs. Bratwurst. Salted nuts and seeds. Canned beans with added salt. Canned or  smoked fish. Whole eggs or egg yolks. Chicken or [place] with skin. Dairy Whole or 2% milk, cream, and half-and-half. Whole or full-fat cream cheese. Whole-fat or sweetened yogurt. Full-fat cheese. Nondairy creamers. Whipped toppings. Processed cheese and cheese spreads. Fats and oils Butter. Stick margarine. Lard. Shortening. Ghee. Bacon fat. Tropical oils, such as coconut, palm kernel, or palm oil. Seasoning and other foods Salted popcorn and pretzels. Onion salt, garlic salt, seasoned salt, table salt, and sea salt. Worcestershire sauce. Tartar sauce. Barbecue sauce. Teriyaki sauce. Soy sauce, including reduced-sodium. Steak sauce. Canned and packaged gravies. Fish sauce. Oyster sauce. Cocktail sauce. Horseradish that you find on the shelf. Ketchup. Mustard. Meat flavorings and tenderizers. Bouillon cubes. Hot sauce and Tabasco sauce. Premade or packaged marinades. Premade or packaged taco seasonings. Relishes. Regular salad dressings. Where to find more information:  National Heart, Lung, and [place]: https://wilson-eaton.com/  American Heart Association: www.heart.org Summary  The DASH eating plan is a healthy eating plan that has been shown to reduce high blood pressure (hypertension). It may also reduce your risk for type 2 diabetes, heart disease, and stroke.  With the DASH eating plan, you should limit salt (sodium) intake to 2,300 mg a day. If you have hypertension, you may need to reduce your sodium intake to 1,500 mg a day.  When on the DASH eating plan, aim to eat more fresh fruits and vegetables, whole grains, lean proteins, low-fat dairy, and heart-healthy fats.  Work with your health care provider or diet and nutrition specialist (dietitian) to adjust your eating plan to your individual calorie needs. This information is not intended to replace advice given to you by your health care provider. Make sure you discuss any questions you have with your health care provider. Document  Revised: 12/15/2016 Document Reviewed: 12/27/2015 Elsevier Patient Education  [place].    Constipation, Adult Constipation is when a person has fewer bowel movements in a week than normal, has difficulty having a bowel movement, or has stools that are dry, hard, or larger than normal. Constipation may be caused by an underlying condition. It may become worse with age if a person takes certain medicines and does not take in enough fluids. Follow these instructions at home: Eating and drinking   Eat foods that have a lot of fiber, such as fresh fruits and vegetables, whole grains, and beans.  Limit foods that are high in fat, low in fiber, or overly processed, such as french fries, hamburgers, cookies, candies, and soda.  Drink enough fluid to keep your urine clear or pale yellow. General instructions  Exercise regularly or as told by your health care provider.  Go to the restroom when you have the urge to go. Do not hold it in.  Take over-the-counter and prescription medicines only as told by your health care provider. These include any fiber supplements.  Practice pelvic floor retraining exercises, such as deep breathing while relaxing the lower abdomen and pelvic floor relaxation during bowel movements.  Watch your condition for any changes.  Keep all follow-up visits as told by your health care provider. This is important. Contact a health care provider if:  You have pain that gets worse.  You have a fever.  You do not have a bowel movement after 4 days.  You vomit.  You are not hungry.  You lose weight.  You are bleeding from the anus.  You have thin, pencil-like stools. Get help right away if:  You have a fever and your symptoms suddenly get worse.  You leak stool or have blood in your stool.  Your abdomen is bloated.  You have severe pain in your abdomen.  You feel dizzy or you faint. This information is not intended to replace advice given to  you by your health care provider. Make sure you discuss any questions you have with your health care provider. Document Revised: 12/15/2016 Document Reviewed: 06/23/2015 Elsevier Patient Education  2020 [employer].

## 2019-04-04 NOTE — Progress Notes (Signed)
Careteam: Patient Care Team: [name], NP as PCP - General (Geriatric Medicine) Leighton Ruff, OD (Optometry)  PLACE OF SERVICE:  [place] Directive information    No Known Allergies  Chief Complaint  Patient presents with  . Medical Management of Chronic Issues    3 month follow-up   . Immunizations    Discuss PNA recommendation   . Advanced Directive    No ACP on file      HPI: Patient is a 68 y.o. male for routine follow up.   DM- A1c at 6.3, continues with diet and exercise modifications. Overall exercise has not been like it should but   Has information on advance directives but has not done.  Has received both COVID vaccines.   Hyperlipidemia- LDL at goal.   htn- high sodium meal yesterday, generally no issue with BP  Some urinary urgency, does not get up at night.   Review of Systems:  Review of Systems  Constitutional: Negative for chills, fever and weight loss.  Respiratory: Negative for cough, sputum production and shortness of breath.   Cardiovascular: Positive for leg swelling (improved with compression hose). Negative for chest pain and palpitations.  Gastrointestinal: Negative for abdominal pain, constipation, diarrhea and heartburn.  Genitourinary: Positive for urgency. Negative for dysuria and frequency.  Musculoskeletal: Negative for back pain, joint pain and myalgias.  Skin: Negative.   Neurological: Negative for dizziness and headaches.  Psychiatric/Behavioral: Negative for depression and memory loss. The patient does not have insomnia.     Past Medical History:  Diagnosis Date  . Bell's palsy    2005  . Carpal tunnel syndrome   . Cataract   . CKD stage 2 due to type 2 diabetes mellitus ([place]) 12/01/2015  . Diabetes mellitus without complication ([place])   . Edema   . Gout, unspecified   . Hyperlipidemia   . Hypertension   . Internal hemorrhoids without mention of complication   . Malignant neoplasm of thyroid  gland ([place])   . Memory loss   . 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 Obesity, unspecified   . Other malaise and fatigue   . Other psoriasis   . Sleep apnea    no CPAP used 09-23-15  . Supraventricular premature beats   . Thyroid disease   . Trigger finger (acquired)   . Unspecified sleep apnea    Past Surgical History:  Procedure Laterality Date  . CATARACT EXTRACTION W/ INTRAOCULAR LENS IMPLANT Right 06/05/2016   Dr. Arlina Robes  . COLONOSCOPY  10/07/2015   one polyp, Dr. Loletha Carrow  . TOTAL THYROIDECTOMY  2007   Dr. Georgina Quint   Social History:   reports that he quit smoking about 40 years ago. He has never used smokeless tobacco. He reports current alcohol use. He reports that he does not use drugs.  Family History  Problem Relation Age of Onset  . Cancer - Prostate Father   . Stomach cancer Father   . Hypertension Other   . Colon cancer Neg Hx   . Esophageal cancer Neg Hx   . Rectal cancer Neg Hx     Medications: Patient's Medications  New Prescriptions   No medications on file  Previous Medications   ASPIRIN EC 81 MG TABLET    Take 81 mg by mouth daily.   BLOOD GLUCOSE MONITORING SUPPL (ONE TOUCH ULTRA MINI) W/DEVICE KIT    Check blood sugar once daily as directed E11.22   CYANOCOBALAMIN (B-12)  1000 MCG TABS    Take by mouth daily.   GLUCOSE BLOOD (ONE TOUCH ULTRA TEST) TEST STRIP    TEST 2 TIMES DAILY FOR DIABETES   INSULIN DETEMIR (LEVEMIR FLEXPEN) 100 UNIT/ML PEN    Inject 44 Units into the skin daily at 10 pm.   INSULIN PEN NEEDLE (B-D ULTRAFINE III SHORT PEN) 31G X 8 MM MISC    Use for insulin injections. Dx: E11.22   LEVOTHYROXINE (SYNTHROID) 112 MCG TABLET    TAKE 1 TABLET (112 MCG TOTAL) BY MOUTH DAILY BEFORE BREAKFAST.   LOSARTAN (COZAAR) 100 MG TABLET    Take 1 tablet (100 mg total) by mouth daily.   LOVASTATIN (MEVACOR) 40 MG TABLET    TAKE 1 TABLET DAILY TO CONTROL CHOLESTEROL   METFORMIN (GLUCOPHAGE) 500 MG TABLET    Take one tablet by mouth before  breakfast and one tablet before supper to control blood sugar.   NONFORMULARY OR COMPOUNDED ITEM    Antifungal solution: Terbinafine 3%, Fluconazole 2%, Tea Tree Oil 5%, Urea 10%, Ibuprofen 2% in DMSO suspension #[id number]   ONETOUCH DELICA LANCETS FINE MISC    Use to check blood sugar twice daily.   SILDENAFIL (VIAGRA) 100 MG TABLET    TAKE ONE TABLET BY MOUTH DAILY AS NEEDED FOR  ERECTILE DYSFUNCTION   VITAMIN C (ASCORBIC ACID) 250 MG TABLET    Take 500 mg by mouth daily.  Modified Medications   No medications on file  Discontinued Medications   No medications on file    Physical Exam:  Vitals:   04/04/19 0833  BP: (!) 158/90  Pulse: (!) 59  Temp: (!) 96.8 F (36 C)  TempSrc: Temporal  SpO2: 99%  Weight: 225 lb (102.1 kg)  Height: 6' (1.829 m)   Body mass index is 30.52 kg/m. Wt Readings from Last 3 Encounters:  04/04/19 225 lb (102.1 kg)  12/30/18 221 lb (100.2 kg)  09/30/18 216 lb (98 kg)    Physical Exam Constitutional:      General: He is not in acute distress.    Appearance: He is well-developed. He is not diaphoretic.  HENT:     Head: Normocephalic and atraumatic.     Mouth/Throat:     Pharynx: No oropharyngeal exudate.  Eyes:     Conjunctiva/sclera: Conjunctivae normal.     Pupils: Pupils are equal, round, and reactive to light.  Cardiovascular:     Rate and Rhythm: Normal rate and regular rhythm.     Heart sounds: Normal heart sounds.  Pulmonary:     Effort: Pulmonary effort is normal.     Breath sounds: Normal breath sounds.  Abdominal:     General: Bowel sounds are normal.     Palpations: Abdomen is soft.  Musculoskeletal:        General: No tenderness.     Cervical back: Normal range of motion and neck supple.  Skin:    General: Skin is warm and dry.  Neurological:     Mental Status: He is alert and oriented to person, place, and time.     Labs reviewed: Basic Metabolic Panel: Recent Labs    05/14/18 0837 06/21/18 1015 09/30/18 1011  12/27/18 0850 03/31/19 0959  NA  --    < > 139 141 140  K  --    < > 4.1 4.2 4.0  CL  --    < > 105 104 105  CO2  --    < > 30 29 28  GLUCOSE  --    < > 109* 87 101*  BUN  --    < > '16 14 13  '$ CREATININE  --    < > 1.09 1.37* 1.17  CALCIUM  --    < > 9.4 9.4 9.2  TSH 0.94  --   --  6.04* 3.68   < > = values in this interval not displayed.   Liver Function Tests: Recent Labs    09/30/18 1011 03/31/19 0959  AST 14 13  ALT 11 10  BILITOT 0.3 0.4  PROT 6.6 6.6   No results for input(s): LIPASE, AMYLASE in the last 8760 hours. No results for input(s): AMMONIA in the last 8760 hours. CBC: Recent Labs    09/30/18 1011 03/31/19 0959  WBC 3.3* 3.5*  NEUTROABS 1,785 1,792  HGB 12.5* 12.6*  HCT 38.1* 38.7  MCV 82.8 83.0  PLT 234 214   Lipid Panel: Recent Labs    09/30/18 1011 03/31/19 0959  CHOL 145 144  HDL 60 63  LDLCALC 71 68  TRIG 55 54  CHOLHDL 2.4 2.3   TSH: Recent Labs    05/14/18 0837 12/27/18 0850 03/31/19 0959  TSH 0.94 6.04* 3.68   A1C: Lab Results  Component Value Date   HGBA1C 6.3 (H) 03/31/2019     Assessment/Plan 1. Postoperative hypothyroidism -continues on synthroid 183mg - TSH; Future  2. CKD stage 2 due to type 2 diabetes mellitus ([place] -has increased water intake. Avoiding NSAIDS  - COMPLETE METABOLIC PANEL WITH GFR; Future  3. Type 2 diabetes mellitus with stage 2 chronic kidney disease, with long-term current use of insulin (HCC) -plans to increase physical activity, continues on metformin with levemir. Encouraged dietary compliance, routine foot care/monitoring and to keep up with diabetic eye exams through ophthalmology  - Hemoglobin A1c; Future - COMPLETE METABOLIC PANEL WITH GFR; Future - Pneumococcal polysaccharide vaccine 23-valent greater than or equal to 2yo subcutaneous/IM  4. Mixed hyperlipidemia LDL at goal on current regimen. Plans to get back into more exercise  - COMPLETE METABOLIC PANEL WITH GFR; Future -  Lipid Panel; Future  5. Edema of left lower extremity due to peripheral venous insufficiency Stable with LE compression wraps and elevation as tolerates.   6. Urinary urgency -occasionally will have episodes of frequency or urgency, does not seem to be persistent at this time.   - PSA; Future  7. Essential hypertension -elevated high sodium diet yesterday, again elevated on recheck. Reports generally not this high, plans to monitor at home and report back. Dash diet encouraged.  - CBC with Differential/Platelet; Future - COMPLETE METABOLIC PANEL WITH GFR; Future  8. Slow transit constipation Education provided.   9. Need for pneumococcal vaccination - Pneumococcal polysaccharide vaccine 23-valent greater than or equal to 2yo subcutaneous/IM  10. Advance care planning -reports he is aware that he should complete paperwork but has not gotten around to it at this time. MOST form given and he reports he will complete this.   Next appt: 4 weeks for bp follow up and MOST form  Brynleigh Sequeira K. E[facility]fer [facility] [phone]

## 2019-04-18 ENCOUNTER — Other Ambulatory Visit: Payer: Self-pay | Admitting: Nurse Practitioner

## 2019-05-05 ENCOUNTER — Other Ambulatory Visit: Payer: Self-pay

## 2019-05-05 ENCOUNTER — Encounter: Payer: Self-pay | Admitting: Podiatry

## 2019-05-05 ENCOUNTER — Ambulatory Visit: Payer: Medicare PPO | Admitting: Podiatry

## 2019-05-05 VITALS — Temp 97.4°F

## 2019-05-05 DIAGNOSIS — E1122 Type 2 diabetes mellitus with diabetic chronic kidney disease: Secondary | ICD-10-CM | POA: Diagnosis not present

## 2019-05-05 DIAGNOSIS — N182 Chronic kidney disease, stage 2 (mild): Secondary | ICD-10-CM

## 2019-05-05 DIAGNOSIS — L84 Corns and callosities: Secondary | ICD-10-CM | POA: Diagnosis not present

## 2019-05-05 DIAGNOSIS — B351 Tinea unguium: Secondary | ICD-10-CM

## 2019-05-05 DIAGNOSIS — M79674 Pain in right toe(s): Secondary | ICD-10-CM

## 2019-05-05 DIAGNOSIS — M79675 Pain in left toe(s): Secondary | ICD-10-CM

## 2019-05-05 DIAGNOSIS — Z794 Long term (current) use of insulin: Secondary | ICD-10-CM

## 2019-05-05 NOTE — Patient Instructions (Signed)
Diabetes Mellitus and Foot Care Foot care is an important part of your health, especially when you have diabetes. Diabetes may cause you to have problems because of poor blood flow (circulation) to your feet and legs, which can cause your skin to:  Become thinner and drier.  Break more easily.  Heal more slowly.  Peel and crack. You may also have nerve damage (neuropathy) in your legs and feet, causing decreased feeling in them. This means that you may not notice minor injuries to your feet that could lead to more serious problems. Noticing and addressing any potential problems early is the best way to prevent future foot problems. How to care for your feet Foot hygiene  Wash your feet daily with warm water and mild soap. Do not use hot water. Then, pat your feet and the areas between your toes until they are completely dry. Do not soak your feet as this can dry your skin.  Trim your toenails straight across. Do not dig under them or around the cuticle. File the edges of your nails with an emery board or nail file.  Apply a moisturizing lotion or petroleum jelly to the skin on your feet and to dry, brittle toenails. Use lotion that does not contain alcohol and is unscented. Do not apply lotion between your toes. Shoes and socks  Wear clean socks or stockings every day. Make sure they are not too tight. Do not wear knee-high stockings since they may decrease blood flow to your legs.  Wear shoes that fit properly and have enough cushioning. Always look in your shoes before you put them on to be sure there are no objects inside.  To break in new shoes, wear them for just a few hours a day. This prevents injuries on your feet. Wounds, scrapes, corns, and calluses  Check your feet daily for blisters, cuts, bruises, sores, and redness. If you cannot see the bottom of your feet, use a mirror or ask someone for help.  Do not cut corns or calluses or try to remove them with medicine.  If you  find a minor scrape, cut, or break in the skin on your feet, keep it and the skin around it clean and dry. You may clean these areas with mild soap and water. Do not clean the area with peroxide, alcohol, or iodine.  If you have a wound, scrape, corn, or callus on your foot, look at it several times a day to make sure it is healing and not infected. Check for: ? Redness, swelling, or pain. ? Fluid or blood. ? Warmth. ? Pus or a bad smell. General instructions  Do not cross your legs. This may decrease blood flow to your feet.  Do not use heating pads or hot water bottles on your feet. They may burn your skin. If you have lost feeling in your feet or legs, you may not know this is happening until it is too late.  Protect your feet from hot and cold by wearing shoes, such as at the beach or on hot pavement.  Schedule a complete foot exam at least once a year (annually) or more often if you have foot problems. If you have foot problems, report any cuts, sores, or bruises to your health care provider immediately. Contact a health care provider if:  You have a medical condition that increases your risk of infection and you have any cuts, sores, or bruises on your feet.  You have an injury that is not   healing.  You have redness on your legs or feet.  You feel burning or tingling in your legs or feet.  You have pain or cramps in your legs and feet.  Your legs or feet are numb.  Your feet always feel cold.  You have pain around a toenail. Get help right away if:  You have a wound, scrape, corn, or callus on your foot and: ? You have pain, swelling, or redness that gets worse. ? You have fluid or blood coming from the wound, scrape, corn, or callus. ? Your wound, scrape, corn, or callus feels warm to the touch. ? You have pus or a bad smell coming from the wound, scrape, corn, or callus. ? You have a fever. ? You have a red line going up your leg. Summary  Check your feet every day  for cuts, sores, red spots, swelling, and blisters.  Moisturize feet and legs daily.  Wear shoes that fit properly and have enough cushioning.  If you have foot problems, report any cuts, sores, or bruises to your health care provider immediately.  Schedule a complete foot exam at least once a year (annually) or more often if you have foot problems. This information is not intended to replace advice given to you by your health care provider. Make sure you discuss any questions you have with your health care provider. Document Revised: 09/25/2018 Document Reviewed: 02/04/2016 Elsevier Patient Education  2020 [employer]  Corns and Calluses Corns are small areas of thickened skin that occur on the top, sides, or tip of a toe. They contain a cone-shaped core with a point that can press on a nerve below. This causes pain.  Calluses are areas of thickened skin that can occur anywhere on the body, including the hands, fingers, palms, soles of the feet, and heels. Calluses are usually larger than corns. What are the causes? Corns and calluses are caused by rubbing (friction) or pressure, such as from shoes that are too tight or do not fit properly. What increases the risk? Corns are more likely to develop in people who have misshapen toes (toe deformities), such as hammer toes. Calluses can occur with friction to any area of the skin. They are more likely to develop in people who:  Work with their hands.  Wear shoes that fit poorly, are too tight, or are high-heeled.  Have toe deformities. What are the signs or symptoms? Symptoms of a corn or callus include:  A hard growth on the skin.  Pain or tenderness under the skin.  Redness and swelling.  Increased discomfort while wearing tight-fitting shoes, if your feet are affected. If a corn or callus becomes infected, symptoms may include:  Redness and swelling that gets worse.  Pain.  Fluid, blood, or pus draining from the corn or  callus. How is this diagnosed? Corns and calluses may be diagnosed based on your symptoms, your medical history, and a physical exam. How is this treated? Treatment for corns and calluses may include:  Removing the cause of the friction or pressure. This may involve: ? Changing your shoes. ? Wearing shoe inserts (orthotics) or other protective layers in your shoes, such as a corn pad. ? Wearing gloves.  Applying medicine to the skin (topical medicine) to help soften skin in the hardened, thickened areas.  Removing layers of dead skin with a file to reduce the size of the corn or callus.  Removing the corn or callus with a scalpel or laser.  Taking   antibiotic medicines, if your corn or callus is infected.  Having surgery, if a toe deformity is the cause. Follow these instructions at home:   Take over-the-counter and prescription medicines only as told by your health care provider.  If you were prescribed an antibiotic, take it as told by your health care provider. Do not stop taking it even if your condition starts to improve.  Wear shoes that fit well. Avoid wearing high-heeled shoes and shoes that are too tight or too loose.  Wear any padding, protective layers, gloves, or orthotics as told by your health care provider.  Soak your hands or feet and then use a file or pumice stone to soften your corn or callus. Do this as told by your health care provider.  Check your corn or callus every day for symptoms of infection. Contact a health care provider if you:  Notice that your symptoms do not improve with treatment.  Have redness or swelling that gets worse.  Notice that your corn or callus becomes painful.  Have fluid, blood, or pus coming from your corn or callus.  Have new symptoms. Summary  Corns are small areas of thickened skin that occur on the top, sides, or tip of a toe.  Calluses are areas of thickened skin that can occur anywhere on the body, including the  hands, fingers, palms, and soles of the feet. Calluses are usually larger than corns.  Corns and calluses are caused by rubbing (friction) or pressure, such as from shoes that are too tight or do not fit properly.  Treatment may include wearing any padding, protective layers, gloves, or orthotics as told by your health care provider. This information is not intended to replace advice given to you by your health care provider. Make sure you discuss any questions you have with your health care provider. Document Revised: 04/24/2018 Document Reviewed: 11/15/2016 Elsevier Patient Education  2020 Elsevier Inc.  

## 2019-05-05 NOTE — Progress Notes (Signed)
Subjective: Mitchell Gibbs presents today for follow up of at risk foot care. Pt has h/o NIDDM with chronic kidney disease and callus(es) of both feet and painful mycotic toenails b/l that are difficult to trim. Pain interferes with ambulation. Aggravating factors include wearing enclosed shoe gear. Pain is relieved with periodic professional debridement..   No Known Allergies   Objective: Vitals:   05/05/19 0837  Temp: (!) 97.4 F (36.3 C)    Pt is a pleasant 69 y.o. AA male, WD, WN in NAD. AAO x 3.  Vascular Examination:  Capillary refill time to digits immediate b/l. Palpable DP pulses b/l. Palpable PT pulses b/l. Pedal hair sparse b/l. Skin temperature gradient within normal limits b/l. No edema noted b/l. No pain with calf compression b/l.  Dermatological Examination: Pedal skin with normal turgor, texture and tone bilaterally. No open wounds bilaterally. No interdigital macerations bilaterally. Toenails 1-5 b/l elongated, dystrophic, thickened, crumbly with subungual debris and tenderness to dorsal palpation. Hyperkeratotic lesion(s) submet head 1 left foot, submet head 1 right foot, submet head 3 left foot and submet head 5 right foot.  No erythema, no edema, no drainage, no flocculence.  Musculoskeletal: Normal muscle strength 5/5 to all lower extremity muscle groups bilaterally, no pain crepitus or joint limitation noted with ROM b/l, bunion deformity noted b/l and hammertoes noted to the  2-5 bilaterally  Neurological: Protective sensation intact 5/5 intact with 10g monofilament b/l. Vibratory sensation intact b/l. Proprioception intact bilaterally. Babinski reflex negative b/l. Clonus negative b/l.   Assessment: 1. Pain due to onychomycosis of toenails of both feet   2. Callus   3. Type 2 diabetes mellitus with stage 2 chronic kidney disease, with long-term current use of insulin ([place])    Plan: -Continue diabetic foot care principles. Literature dispensed on today.   -Toenails 1-5 b/l were debrided in length and girth with sterile nail nippers and dremel without iatrogenic bleeding.  -Callus(es) submet head 1 left foot, submet head 1 right foot, submet head 3 left foot and submet head 5 right foot were debrided without complication or incident. Total number debrided =4. -Patient to continue soft, supportive shoe gear daily. -Patient to report any pedal injuries to medical professional immediately. -Patient/POA to call should there be question/concern in the interim.  Return in about 3 months (around 08/04/2019) for diabetic nail and callus trim.

## 2019-05-26 ENCOUNTER — Other Ambulatory Visit: Payer: Self-pay

## 2019-05-26 MED ORDER — SILDENAFIL CITRATE 100 MG PO TABS: ORAL_TABLET | ORAL | 0 refills | Status: DC

## 2019-05-26 NOTE — Telephone Encounter (Signed)
Patient called for refill on Viagra

## 2019-06-09 ENCOUNTER — Emergency Department (HOSPITAL_COMMUNITY)
Admission: EM | Admit: 2019-06-09 | Discharge: 2019-06-09 | Disposition: A | Discharge status: Home / Self Care | Payer: Medicare PPO | Attending: Emergency Medicine | Admitting: Emergency Medicine

## 2019-06-09 ENCOUNTER — Encounter (HOSPITAL_COMMUNITY): Payer: Self-pay

## 2019-06-09 ENCOUNTER — Emergency Department (HOSPITAL_COMMUNITY): Payer: Medicare PPO

## 2019-06-09 ENCOUNTER — Other Ambulatory Visit: Payer: Self-pay

## 2019-06-09 DIAGNOSIS — N182 Chronic kidney disease, stage 2 (mild): Secondary | ICD-10-CM | POA: Insufficient documentation

## 2019-06-09 DIAGNOSIS — E1122 Type 2 diabetes mellitus with diabetic chronic kidney disease: Secondary | ICD-10-CM | POA: Insufficient documentation

## 2019-06-09 DIAGNOSIS — M5431 Sciatica, right side: Secondary | ICD-10-CM

## 2019-06-09 DIAGNOSIS — E890 Postprocedural hypothyroidism: Secondary | ICD-10-CM | POA: Diagnosis not present

## 2019-06-09 DIAGNOSIS — I129 Hypertensive chronic kidney disease with stage 1 through stage 4 chronic kidney disease, or unspecified chronic kidney disease: Secondary | ICD-10-CM | POA: Diagnosis not present

## 2019-06-09 DIAGNOSIS — Z794 Long term (current) use of insulin: Secondary | ICD-10-CM | POA: Diagnosis not present

## 2019-06-09 DIAGNOSIS — Z87891 Personal history of nicotine dependence: Secondary | ICD-10-CM | POA: Insufficient documentation

## 2019-06-09 DIAGNOSIS — Z7982 Long term (current) use of aspirin: Secondary | ICD-10-CM | POA: Insufficient documentation

## 2019-06-09 DIAGNOSIS — Z85850 Personal history of malignant neoplasm of thyroid: Secondary | ICD-10-CM | POA: Insufficient documentation

## 2019-06-09 DIAGNOSIS — Z79899 Other long term (current) drug therapy: Secondary | ICD-10-CM | POA: Diagnosis not present

## 2019-06-09 DIAGNOSIS — M25551 Pain in right hip: Secondary | ICD-10-CM | POA: Diagnosis present

## 2019-06-09 MED ORDER — DICLOFENAC SODIUM 75 MG PO TBEC: 75.0000 mg | DELAYED_RELEASE_TABLET | Freq: Two times a day (BID) | ORAL | 0 refills | Status: DC

## 2019-06-09 MED ORDER — METHOCARBAMOL 500 MG PO TABS: 500.0000 mg | ORAL_TABLET | Freq: Four times a day (QID) | ORAL | 0 refills | Status: DC

## 2019-06-09 NOTE — ED Provider Notes (Signed)
[facility] DEPT Provider Note   CSN: [id number] Arrival date & time: 06/09/19  1218     History Chief Complaint  Patient presents with  . Hip Pain    Mitchell Gibbs is a 69 y.o. male.  Pt complains of pain in his right hip and back.  Pt reports pain began after driving.  Pt did have a wallet in his pocket   The history is provided by the patient. No language interpreter was used.  Hip Pain This is a new problem. The problem occurs constantly. The problem has not changed since onset.Pertinent negatives include no chest pain. Nothing aggravates the symptoms. Nothing relieves the symptoms. He has tried nothing for the symptoms. The treatment provided no relief.       Past Medical History:  Diagnosis Date  . Bell's palsy    2005  . Carpal tunnel syndrome   . Cataract   . CKD stage 2 due to type 2 diabetes mellitus ([place]) 12/01/2015  . Diabetes mellitus without complication ([place])   . Edema   . Gout, unspecified   . Hyperlipidemia   . Hypertension   . Internal hemorrhoids without mention of complication   . Malignant neoplasm of thyroid gland ([place])   . Memory loss   . 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 Obesity, unspecified   . Other malaise and fatigue   . Other psoriasis   . Sleep apnea    no CPAP used 09-23-15  . Supraventricular premature beats   . Thyroid disease   . Trigger finger (acquired)   . Unspecified sleep apnea     Patient Active Problem List   Diagnosis Date Noted  . Postoperative hypothyroidism 05/16/2017  . High risk medication use 05/16/2017  . CKD stage 2 due to type 2 diabetes mellitus ([place]) 12/01/2015  . Cerumen impaction 07/28/2015  . BPPV (benign paroxysmal positional vertigo) 10/06/2014  . Pain in finger of left hand 07/28/2014  . Flank pain 05/12/2014  . Tinea pedis of left foot 02/09/2014  . Chest pain, unspecified 05/07/2013  . Immunization due 04/15/2013  . Erectile dysfunction 03/12/2013  . DM  (diabetes mellitus), type 2 with renal complications ([place]) 99/83/3825  . Edema 08/13/2012  . Hypertension   . Memory loss   . Obesity   . Unspecified sleep apnea   . Hyperlipidemia     Past Surgical History:  Procedure Laterality Date  . CATARACT EXTRACTION W/ INTRAOCULAR LENS IMPLANT Right 06/05/2016   Dr. Arlina Robes  . COLONOSCOPY  10/07/2015   one polyp, Dr. Loletha Carrow  . TOTAL THYROIDECTOMY  2007   Dr. Georgina Quint       Family History  Problem Relation Age of Onset  . Cancer - Prostate Father   . Stomach cancer Father   . Hypertension Other   . Colon cancer Neg Hx   . Esophageal cancer Neg Hx   . Rectal cancer Neg Hx     Social History   Tobacco Use  . Smoking status: Former Smoker    Quit date: 10/27/1978    Years since quitting: 40.6  . Smokeless tobacco: Never Used  Substance Use Topics  . Alcohol use: Yes    Comment: rarely  . Drug use: No    Home Medications Prior to Admission medications   Medication Sig Start Date End Date Taking? Authorizing Provider  aspirin EC 81 MG tablet Take 81 mg by mouth daily.    [provider]  Blood  Glucose Monitoring Suppl (ONE TOUCH ULTRA MINI) w/Device KIT Check blood sugar once daily as directed E11.22 06/26/18   [name], NP  Cyanocobalamin (B-12) 1000 MCG TABS Take by mouth daily.    [provider]  diclofenac (VOLTAREN) 75 MG EC tablet Take 1 tablet (75 mg total) by mouth 2 (two) times daily. 06/09/19   Fransico Meadow, PA-C  glucose blood (ONE TOUCH ULTRA TEST) test strip TEST 2 TIMES DAILY FOR DIABETES 06/21/18   [name], NP  Insulin Detemir (LEVEMIR FLEXPEN) 100 UNIT/ML Pen Inject 44 Units into the skin daily at 10 pm. 03/14/19   [name], NP  Insulin Pen Needle (B-D ULTRAFINE III SHORT PEN) 31G X 8 MM MISC Use for insulin injections. Dx: E11.22 06/06/18   [name], NP  levothyroxine (SYNTHROID) 112 MCG tablet TAKE 1 TABLET (112 MCG TOTAL) BY MOUTH DAILY BEFORE  BREAKFAST. 01/02/19   [name], NP  losartan (COZAAR) 100 MG tablet Take 1 tablet (100 mg total) by mouth daily. 09/30/18   [name], NP  lovastatin (MEVACOR) 40 MG tablet TAKE 1 TABLET DAILY TO CONTROL CHOLESTEROL 09/05/18   [name], NP  metFORMIN (GLUCOPHAGE) 500 MG tablet Take one tablet by mouth before breakfast and one tablet before supper to control blood sugar. 03/31/19   [name], NP  methocarbamol (ROBAXIN) 500 MG tablet Take 1 tablet (500 mg total) by mouth 4 (four) times daily. 06/09/19   Fransico Meadow, PA-C  NONFORMULARY OR COMPOUNDED ITEM Antifungal solution: Terbinafine 3%, Fluconazole 2%, Tea Tree Oil 5%, Urea 10%, Ibuprofen 2% in DMSO suspension #70m 10/25/18   [name], [name] DPM  ONETOUCH DELICA LANCETS FINE MISC Use to check blood sugar twice daily. 09/25/17   CGildardo Cranker DO  sildenafil (VIAGRA) 100 MG tablet Take 1 tablet by mouth as needed for erectile dysfunction 05/26/19   ELauree Chandler NP  vitamin C (ASCORBIC ACID) 250 MG tablet Take 500 mg by mouth daily.    [provider]    Allergies    Patient has no known allergies.  Review of Systems   Review of Systems  Cardiovascular: Negative for chest pain.  All other systems reviewed and are negative.   Physical Exam Updated Vital Signs BP (!) 180/104 (BP Location: Left Arm)   Pulse (!) 55   Temp 98.7 F (37.1 C) (Oral)   Resp 18   Ht '6\' 2"'$  (1.88 m)   Wt 98 kg   SpO2 100%   BMI 27.73 kg/m   Physical Exam Vitals reviewed.  Constitutional:      Appearance: Normal appearance.  HENT:     Head: Normocephalic.     Nose: Nose normal.     Mouth/Throat:     Mouth: Mucous membranes are moist.  Eyes:     Pupils: Pupils are equal, round, and reactive to light.  Cardiovascular:     Rate and Rhythm: Normal rate.  Pulmonary:     Effort: Pulmonary effort is normal.  Musculoskeletal:        General: Tenderness present. Normal range of motion.     Cervical  back: Normal range of motion.  Skin:    General: Skin is warm.  Neurological:     General: No focal deficit present.     Mental Status: He is alert.  Psychiatric:        Mood and Affect: Mood normal.     ED Results / Procedures /  Treatments   Labs (all labs ordered are listed, but only abnormal results are displayed) Labs Reviewed - No data to display  EKG None  Radiology DG Hip Unilat W or Wo Pelvis 2-3 Views Right  Result Date: 06/09/2019 CLINICAL DATA:  Posterior right hip pain EXAM: DG HIP (WITH OR WITHOUT PELVIS) 2-3V RIGHT COMPARISON:  None. FINDINGS: There is no evidence of hip fracture or dislocation. There is no evidence of arthropathy or other focal bone abnormality. IMPRESSION: No fracture or dislocation of the right hip. Joint spaces are preserved. Electronically Signed   By: Eddie Candle M.D.   On: 06/09/2019 14:08    Procedures Procedures (including critical care time)  Medications Ordered in ED Medications - No data to display  ED Course  I have reviewed the triage vital signs and the nursing notes.  Pertinent labs & imaging results that were available during my care of the patient were reviewed by me and considered in my medical decision making (see chart for details).    MDM Rules/Calculators/A&P                      MDM:  Final Clinical Impression(s) / ED Diagnoses Final diagnoses:  Sciatica of right side    Rx / DC Orders ED Discharge Orders         Ordered    diclofenac (VOLTAREN) 75 MG EC tablet  2 times daily     06/09/19 1414    methocarbamol (ROBAXIN) 500 MG tablet  4 times daily     06/09/19 1414        An After Visit Summary was printed and given to the patient.    Fransico Meadow, [place] 06/09/19 1424    Veryl Speak, MD 06/09/19 1513

## 2019-06-09 NOTE — ED Triage Notes (Addendum)
Pt reports right hip pain that radiates down leg that started on Saturday. Pt reports pain is worse at times making it hard to walk around. Pt denies recent injury. Pt ambulatory in triage

## 2019-06-09 NOTE — Discharge Instructions (Signed)
Return if any problems.

## 2019-06-10 ENCOUNTER — Telehealth: Payer: Self-pay | Admitting: Family

## 2019-06-10 NOTE — Telephone Encounter (Signed)
Patient called at 5: 20 pm request to cancel appointment for 06/11/2019 at 9 Am.

## 2019-06-11 ENCOUNTER — Ambulatory Visit: Payer: Self-pay | Admitting: Family

## 2019-06-11 NOTE — Telephone Encounter (Signed)
Appointment Canceled

## 2019-06-24 ENCOUNTER — Other Ambulatory Visit: Payer: Self-pay | Admitting: Nurse Practitioner

## 2019-06-24 DIAGNOSIS — E782 Mixed hyperlipidemia: Secondary | ICD-10-CM

## 2019-07-22 ENCOUNTER — Other Ambulatory Visit: Payer: Self-pay | Admitting: Nurse Practitioner

## 2019-08-04 ENCOUNTER — Encounter: Payer: Self-pay | Admitting: Podiatry

## 2019-08-04 ENCOUNTER — Other Ambulatory Visit: Payer: Self-pay

## 2019-08-04 ENCOUNTER — Ambulatory Visit: Payer: Medicare PPO | Admitting: Podiatry

## 2019-08-04 DIAGNOSIS — L84 Corns and callosities: Secondary | ICD-10-CM | POA: Diagnosis not present

## 2019-08-04 DIAGNOSIS — Z794 Long term (current) use of insulin: Secondary | ICD-10-CM

## 2019-08-04 DIAGNOSIS — N182 Chronic kidney disease, stage 2 (mild): Secondary | ICD-10-CM | POA: Diagnosis not present

## 2019-08-04 DIAGNOSIS — M79674 Pain in right toe(s): Secondary | ICD-10-CM

## 2019-08-04 DIAGNOSIS — E1122 Type 2 diabetes mellitus with diabetic chronic kidney disease: Secondary | ICD-10-CM

## 2019-08-04 DIAGNOSIS — B351 Tinea unguium: Secondary | ICD-10-CM

## 2019-08-04 DIAGNOSIS — M79675 Pain in left toe(s): Secondary | ICD-10-CM

## 2019-08-06 NOTE — Progress Notes (Signed)
Subjective: Mitchell Gibbs presents today for follow up of at risk foot care. Pt has h/o NIDDM with chronic kidney disease and callus(es) of both feet and painful mycotic toenails b/l that are difficult to trim. Pain interferes with ambulation. Aggravating factors include wearing enclosed shoe gear. Pain is relieved with periodic professional debridement.   He voices no new pedal problems on today's visit.  No Known Allergies   Objective: There were no vitals filed for this visit.  Pt is a pleasant 69 y.o. AA male, WD, WN in NAD. AAO x 3.  Vascular Examination:  Capillary refill time to digits immediate b/l. Palpable DP pulses b/l. Palpable PT pulses b/l. Pedal hair sparse b/l. Skin temperature gradient within normal limits b/l. No edema noted b/l. No pain with calf compression b/l.  Dermatological Examination: Pedal skin with normal turgor, texture and tone bilaterally. No open wounds bilaterally. No interdigital macerations bilaterally. Toenails 1-5 b/l elongated, discolored, dystrophic, thickened, crumbly with subungual debris and tenderness to dorsal palpation. Hyperkeratotic lesion(s) submet head 1 left foot, submet head 1 right foot, submet head 3 left foot and submet head 5 right foot.  No erythema, no edema, no drainage, no flocculence.  Musculoskeletal: Normal muscle strength 5/5 to all lower extremity muscle groups bilaterally, no pain crepitus or joint limitation noted with ROM b/l, bunion deformity noted b/l and hammertoes noted to the  2-5 bilaterally  Neurological: Protective sensation intact 5/5 intact with 10g monofilament b/l. Vibratory sensation intact b/l. Proprioception intact bilaterally. Babinski reflex negative b/l. Clonus negative b/l.  Assessment: 1. Pain due to onychomycosis of toenails of both feet   2. Callus   3. Type 2 diabetes mellitus with stage 2 chronic kidney disease, with long-term current use of insulin (Mitchell Gibbs)    Plan: -Continue diabetic foot care  principles. Literature dispensed on today.  -Toenails 1-5 b/l were debrided in length and girth with sterile nail nippers and dremel without iatrogenic bleeding.  -Callus(es) submet head 1 left foot, submet head 1 right foot, submet head 3 left foot and submet head 5 right foot were debrided without complication or incident. Total number debrided =4. -Patient to continue soft, supportive shoe gear daily. -Patient to report any pedal injuries to medical professional immediately. -Patient/POA to call should there be question/concern in the interim.  Return in about 3 months (around 11/04/2019) for diabetic nail and callus trim.

## 2019-09-24 ENCOUNTER — Other Ambulatory Visit: Payer: Self-pay | Admitting: Nurse Practitioner

## 2019-09-24 DIAGNOSIS — I10 Essential (primary) hypertension: Secondary | ICD-10-CM

## 2019-10-02 ENCOUNTER — Other Ambulatory Visit: Payer: Self-pay | Admitting: Nurse Practitioner

## 2019-10-06 ENCOUNTER — Other Ambulatory Visit: Payer: Medicare PPO

## 2019-10-06 ENCOUNTER — Other Ambulatory Visit: Payer: Self-pay

## 2019-10-06 ENCOUNTER — Encounter: Payer: Self-pay | Admitting: Nurse Practitioner

## 2019-10-06 ENCOUNTER — Ambulatory Visit (INDEPENDENT_AMBULATORY_CARE_PROVIDER_SITE_OTHER): Payer: Medicare PPO

## 2019-10-06 DIAGNOSIS — E890 Postprocedural hypothyroidism: Secondary | ICD-10-CM

## 2019-10-06 DIAGNOSIS — Z23 Encounter for immunization: Secondary | ICD-10-CM

## 2019-10-06 DIAGNOSIS — N182 Chronic kidney disease, stage 2 (mild): Secondary | ICD-10-CM

## 2019-10-06 DIAGNOSIS — E1122 Type 2 diabetes mellitus with diabetic chronic kidney disease: Secondary | ICD-10-CM

## 2019-10-06 DIAGNOSIS — E782 Mixed hyperlipidemia: Secondary | ICD-10-CM

## 2019-10-06 DIAGNOSIS — I10 Essential (primary) hypertension: Secondary | ICD-10-CM

## 2019-10-06 DIAGNOSIS — R3915 Urgency of urination: Secondary | ICD-10-CM

## 2019-10-07 ENCOUNTER — Other Ambulatory Visit: Payer: Self-pay | Admitting: *Deleted

## 2019-10-08 LAB — COMPLETE METABOLIC PANEL WITH GFR
AG Ratio: 1.3 (calc) (ref 1.0–2.5)
ALT: 9 U/L (ref 9–46)
AST: 15 U/L (ref 10–35)
Albumin: 3.8 g/dL (ref 3.6–5.1)
Alkaline phosphatase (APISO): 61 U/L (ref 35–144)
BUN/Creatinine Ratio: 10 (calc) (ref 6–22)
BUN: 13 mg/dL (ref 7–25)
CO2: 30 mmol/L (ref 20–32)
Calcium: 9.5 mg/dL (ref 8.6–10.3)
Chloride: 102 mmol/L (ref 98–110)
Creat: 1.26 mg/dL — ABNORMAL HIGH (ref 0.70–1.25)
GFR, Est African American: 68 mL/min/{1.73_m2} (ref >=60)
GFR, Est Non African American: 59 mL/min/{1.73_m2} — ABNORMAL LOW (ref >=60)
Globulin: 3 g/dL (calc) (ref 1.9–3.7)
Glucose, Bld: 79 mg/dL (ref 65–99)
Potassium: 4 mmol/L (ref 3.5–5.3)
Sodium: 139 mmol/L (ref 135–146)
Total Bilirubin: 0.4 mg/dL (ref 0.2–1.2)
Total Protein: 6.8 g/dL (ref 6.1–8.1)

## 2019-10-08 LAB — TEST AUTHORIZATION

## 2019-10-08 LAB — CBC WITH DIFFERENTIAL/PLATELET
Absolute Monocytes: 320 cells/uL (ref 200–950)
Basophils Absolute: 31 cells/uL (ref 0–200)
Basophils Relative: 0.8 %
Eosinophils Absolute: 140 cells/uL (ref 15–500)
Eosinophils Relative: 3.6 %
HCT: 40.5 % (ref 38.5–50.0)
Hemoglobin: 13.2 g/dL (ref 13.2–17.1)
Lymphs Abs: 1556 cells/uL (ref 850–3900)
MCH: 27.4 pg (ref 27.0–33.0)
MCHC: 32.6 g/dL (ref 32.0–36.0)
MCV: 84.2 fL (ref 80.0–100.0)
MPV: 9.7 fL (ref 7.5–12.5)
Monocytes Relative: 8.2 %
Neutro Abs: 1853 cells/uL (ref 1500–7800)
Neutrophils Relative %: 47.5 %
Platelets: 224 10*3/uL (ref 140–400)
RBC: 4.81 10*6/uL (ref 4.20–5.80)
RDW: 13.6 % (ref 11.0–15.0)
Total Lymphocyte: 39.9 %
WBC: 3.9 10*3/uL (ref 3.8–10.8)

## 2019-10-08 LAB — LIPID PANEL
Cholesterol: 146 mg/dL (ref <200)
HDL: 66 mg/dL (ref >=40)
LDL Cholesterol (Calc): 68 mg/dL (calc)
Non-HDL Cholesterol (Calc): 80 mg/dL (calc) (ref <130)
Total CHOL/HDL Ratio: 2.2 (calc) (ref <5.0)
Triglycerides: 50 mg/dL (ref <150)

## 2019-10-08 LAB — PSA: PSA: 0.49 ng/mL (ref <=4.0)

## 2019-10-08 LAB — HEMOGLOBIN A1C
Hgb A1c MFr Bld: 6.5 % of total Hgb — ABNORMAL HIGH (ref <5.7)
Mean Plasma Glucose: 140 (calc)
eAG (mmol/L): 7.7 (calc)

## 2019-10-08 LAB — TSH: TSH: 13.11 mIU/L — ABNORMAL HIGH (ref 0.40–4.50)

## 2019-10-10 ENCOUNTER — Ambulatory Visit (INDEPENDENT_AMBULATORY_CARE_PROVIDER_SITE_OTHER): Payer: Medicare PPO | Admitting: Nurse Practitioner

## 2019-10-10 ENCOUNTER — Other Ambulatory Visit: Payer: Self-pay

## 2019-10-10 ENCOUNTER — Encounter: Payer: Self-pay | Admitting: Nurse Practitioner

## 2019-10-10 VITALS — BP 160/100 | HR 54 | Temp 97.9°F | Resp 20 | Ht 72.0 in | Wt 224.0 lb

## 2019-10-10 DIAGNOSIS — E782 Mixed hyperlipidemia: Secondary | ICD-10-CM | POA: Diagnosis not present

## 2019-10-10 DIAGNOSIS — E6609 Other obesity due to excess calories: Secondary | ICD-10-CM

## 2019-10-10 DIAGNOSIS — E890 Postprocedural hypothyroidism: Secondary | ICD-10-CM | POA: Diagnosis not present

## 2019-10-10 DIAGNOSIS — E1122 Type 2 diabetes mellitus with diabetic chronic kidney disease: Secondary | ICD-10-CM

## 2019-10-10 DIAGNOSIS — I10 Essential (primary) hypertension: Secondary | ICD-10-CM

## 2019-10-10 DIAGNOSIS — Z6830 Body mass index (BMI) 30.0-30.9, adult: Secondary | ICD-10-CM

## 2019-10-10 DIAGNOSIS — N182 Chronic kidney disease, stage 2 (mild): Secondary | ICD-10-CM

## 2019-10-10 DIAGNOSIS — Z794 Long term (current) use of insulin: Secondary | ICD-10-CM

## 2019-10-10 NOTE — Progress Notes (Signed)
Careteam: Patient Care Team: [name], NP as PCP - General (Geriatric Medicine) Leighton Ruff, [place] (Optometry)  PLACE OF SERVICE:  [place] Directive information Does Patient Have a Medical Advance Directive?: No, Would patient like information on creating a medical advance directive?: Yes (MAU/Ambulatory/Procedural Areas - Information given)  No Known Allergies  Chief Complaint  Patient presents with  . Medical Management of Chronic Issues    6 Month Follow Up and Discuss Labs Copy Printed  . Quality Metric Gaps    Foot exam due today, Discuss the need for eye exam  . Advanced Directive    packet given     HPI: Patient is a 69 y.o. male for routine follow up  Got flu shot when he was here last week for labs  hypothyroid forgot his synthroid for ~ a week. Has restarted at this time.   htn- takes blood pressure at home and reports normal. sbp <140 and dbp ~80. Woke up late this morning and did not take any medication before he left the house.   DM- over a year for last eye doctor appt, changed insurances due to retirement. Sees podiatry. No low blood sugar. Continues on levemir 44 units.   Hyperlipidemia- continues on lovastatin 40 mg daily   Review of Systems:  Review of Systems  Constitutional: Negative for chills, fever and weight loss.  HENT: Negative for tinnitus.   Respiratory: Negative for cough, sputum production and shortness of breath.   Cardiovascular: Negative for chest pain, palpitations and leg swelling.  Gastrointestinal: Negative for abdominal pain, constipation, diarrhea and heartburn.  Genitourinary: Negative for dysuria, frequency and urgency.  Musculoskeletal: Negative for back pain, falls, joint pain and myalgias.  Skin: Negative.   Neurological: Negative for dizziness and headaches.  Psychiatric/Behavioral: Negative for depression and memory loss. The patient does not have insomnia.     Past Medical History:   Diagnosis Date  . Bell's palsy    2005  . Carpal tunnel syndrome   . Cataract   . CKD stage 2 due to type 2 diabetes mellitus ([place]) 12/01/2015  . Diabetes mellitus without complication ([place])   . Edema   . Gout, unspecified   . Hyperlipidemia   . Hypertension   . Internal hemorrhoids without mention of complication   . Malignant neoplasm of thyroid gland ([place])   . Memory loss   . 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 Obesity, unspecified   . Other malaise and fatigue   . Other psoriasis   . Sleep apnea    no CPAP used 09-23-15  . Supraventricular premature beats   . Thyroid disease   . Trigger finger (acquired)   . Unspecified sleep apnea    Past Surgical History:  Procedure Laterality Date  . CATARACT EXTRACTION W/ INTRAOCULAR LENS IMPLANT Right 06/05/2016   Dr. Arlina Robes  . COLONOSCOPY  10/07/2015   one polyp, Dr. Loletha Carrow  . TOTAL THYROIDECTOMY  2007   Dr. Georgina Quint   Social History:   reports that he quit smoking about 40 years ago. He has never used smokeless tobacco. He reports current alcohol use. He reports that he does not use drugs.  Family History  Problem Relation Age of Onset  . Cancer - Prostate Father   . Stomach cancer Father   . Hypertension Other   . Colon cancer Neg Hx   . Esophageal cancer Neg Hx   . Rectal cancer Neg Hx  Medications: Patient's Medications  New Prescriptions   No medications on file  Previous Medications   ASPIRIN EC 81 MG TABLET    Take 81 mg by mouth daily.   CYANOCOBALAMIN (B-12) 1000 MCG TABS    Take by mouth daily.   INSULIN DETEMIR (LEVEMIR FLEXPEN) 100 UNIT/ML PEN    Inject 44 Units into the skin daily at 10 pm.   INSULIN PEN NEEDLE (B-D ULTRAFINE III SHORT PEN) 31G X 8 MM MISC    Use for insulin injections. Dx: E11.22   LEVOTHYROXINE (SYNTHROID) 112 MCG TABLET    TAKE 1 TABLET (112 MCG TOTAL) BY MOUTH DAILY BEFORE BREAKFAST.   LOSARTAN (COZAAR) 100 MG TABLET    TAKE 1 TABLET BY MOUTH EVERY DAY   LOVASTATIN  (MEVACOR) 40 MG TABLET    TAKE 1 TABLET DAILY TO CONTROL CHOLESTEROL   METFORMIN (GLUCOPHAGE) 500 MG TABLET    Take one tablet by mouth before breakfast and one tablet before supper to control blood sugar.   NONFORMULARY OR COMPOUNDED ITEM    Antifungal solution: Terbinafine 3%, Fluconazole 2%, Tea Tree Oil 5%, Urea 10%, Ibuprofen 2% in DMSO suspension #36mL   SILDENAFIL (VIAGRA) 100 MG TABLET    TAKE ONE TABLET BY MOUTH DAILY AS NEEDED FOR FOR ERECTILE DYSFUNCTION   UNABLE TO FIND    Med Name: Live and go Diabetic supply check blood sugar once daily   VITAMIN C (ASCORBIC ACID) 250 MG TABLET    Take 500 mg by mouth daily.  Modified Medications   No medications on file  Discontinued Medications   BLOOD GLUCOSE MONITORING SUPPL (ONE TOUCH ULTRA MINI) W/DEVICE KIT    Check blood sugar once daily as directed E11.22   DICLOFENAC (VOLTAREN) 75 MG EC TABLET    Take 1 tablet (75 mg total) by mouth 2 (two) times daily.   GLUCOSE BLOOD (ONE TOUCH ULTRA TEST) TEST STRIP    TEST 2 TIMES DAILY FOR DIABETES   METHOCARBAMOL (ROBAXIN) 500 MG TABLET    Take 1 tablet (500 mg total) by mouth 4 (four) times daily.   ONETOUCH DELICA LANCETS FINE MISC    Use to check blood sugar twice daily.    Physical Exam:  Vitals:   10/10/19 0834  BP: (!) 160/100  Pulse: (!) 54  Resp: 20  Temp: 97.9 F (36.6 C)  TempSrc: Temporal  SpO2: 100%  Weight: 224 lb (101.6 kg)  Height: 6' (1.829 m)   Body mass index is 30.38 kg/m. Wt Readings from Last 3 Encounters:  10/10/19 224 lb (101.6 kg)  06/09/19 216 lb (98 kg)  04/04/19 225 lb (102.1 kg)    Physical Exam Constitutional:      General: He is not in acute distress.    Appearance: He is well-developed. He is not diaphoretic.  HENT:     Head: Normocephalic and atraumatic.     Mouth/Throat:     Pharynx: No oropharyngeal exudate.  Eyes:     Conjunctiva/sclera: Conjunctivae normal.     Pupils: Pupils are equal, round, and reactive to light.  Cardiovascular:      Rate and Rhythm: Normal rate and regular rhythm.     Pulses:          Dorsalis pedis pulses are 2+ on the right side and 2+ on the left side.       Posterior tibial pulses are 2+ on the right side and 2+ on the left side.     Heart sounds: Normal heart  sounds.  Pulmonary:     Effort: Pulmonary effort is normal.     Breath sounds: Normal breath sounds.  Abdominal:     General: Bowel sounds are normal.     Palpations: Abdomen is soft.  Musculoskeletal:        General: No tenderness.     Cervical back: Normal range of motion and neck supple.  Feet:     Right foot:     Protective Sensation: 4 sites tested. 4 sites sensed.     Skin integrity: Skin integrity normal.     Toenail Condition: Fungal disease present.    Left foot:     Protective Sensation: 4 sites tested. 4 sites sensed.     Skin integrity: Skin integrity normal.     Toenail Condition: Fungal disease present. Skin:    General: Skin is warm and dry.  Neurological:     Mental Status: He is alert and oriented to person, place, and time.     Labs reviewed: Basic Metabolic Panel: Recent Labs    12/27/18 0850 03/31/19 0959 10/06/19 0804  NA 141 140 139  K 4.2 4.0 4.0  CL 104 105 102  CO2 $Re'29 28 30  'jZm$ GLUCOSE 87 101* 79  BUN $Re'14 13 13  'xRR$ CREATININE 1.37* 1.17 1.26*  CALCIUM 9.4 9.2 9.5  TSH 6.04* 3.68 13.11*   Liver Function Tests: Recent Labs    03/31/19 0959 10/06/19 0804  AST 13 15  ALT 10 9  BILITOT 0.4 0.4  PROT 6.6 6.8   No results for input(s): LIPASE, AMYLASE in the last 8760 hours. No results for input(s): AMMONIA in the last 8760 hours. CBC: Recent Labs    03/31/19 0959 10/06/19 0804  WBC 3.5* 3.9  NEUTROABS 1,792 1,853  HGB 12.6* 13.2  HCT 38.7 40.5  MCV 83.0 84.2  PLT 214 224   Lipid Panel: Recent Labs    03/31/19 0959 10/06/19 0804  CHOL 144 146  HDL 63 66  LDLCALC 68 68  TRIG 54 50  CHOLHDL 2.3 2.2   TSH: Recent Labs    12/27/18 0850 03/31/19 0959 10/06/19 0804  TSH 6.04*  3.68 13.11*   A1C: Lab Results  Component Value Date   HGBA1C 6.5 (H) 10/06/2019     Assessment/Plan 1. Type 2 diabetes mellitus with stage 2 chronic kidney disease, with long-term current use of insulin (HCC) a1c at goal, without hypoglycemia -Encouraged dietary compliance, routine foot care/monitoring and to keep up with diabetic eye exams through ophthalmology  - Ambulatory referral to Ophthalmology - Hemoglobin A1c; Future  2. CKD stage 2 due to type 2 diabetes mellitus ([place]) -Encourage proper hydration and to avoid NSAIDS (Aleve, Advil, Motrin, Ibuprofen)   3. Postoperative hypothyroidism -continues on synthroid, he has previously been stable on synthryoid 112 mcg daily but recently has forgotten to take medication. Encouraged compliance with medication and will continue the same dose.  - TSH; Future  4. Mixed hyperlipidemia -stable, LDL at goal, continues on lovastatin with dietary modifications   5. Essential hypertension Controlled with losartan 100 mg daily- high at visit as he did not take medication. Will send [place] BP log for review, reports bp <140/90. - CBC with Differential/Platelet; Future - COMPLETE METABOLIC PANEL WITH GFR; Future  6. Class 1 obesity due to excess calories without serious comorbidity with body mass index (BMI) of 30.0 to 30.9 in adult -encouraged weight loss though dietary modifications and increase in physical activity.   Next appt: 6 months.  Janett Billow  Beaulah Corin, [facility] [phone]

## 2019-11-04 ENCOUNTER — Encounter: Payer: Self-pay | Admitting: Podiatry

## 2019-11-04 ENCOUNTER — Other Ambulatory Visit: Payer: Self-pay

## 2019-11-04 ENCOUNTER — Ambulatory Visit: Payer: Medicare PPO | Admitting: Podiatry

## 2019-11-04 DIAGNOSIS — Z794 Long term (current) use of insulin: Secondary | ICD-10-CM | POA: Diagnosis not present

## 2019-11-04 DIAGNOSIS — B351 Tinea unguium: Secondary | ICD-10-CM | POA: Diagnosis not present

## 2019-11-04 DIAGNOSIS — E1122 Type 2 diabetes mellitus with diabetic chronic kidney disease: Secondary | ICD-10-CM

## 2019-11-04 DIAGNOSIS — M79674 Pain in right toe(s): Secondary | ICD-10-CM

## 2019-11-04 DIAGNOSIS — M79675 Pain in left toe(s): Secondary | ICD-10-CM

## 2019-11-04 DIAGNOSIS — L84 Corns and callosities: Secondary | ICD-10-CM | POA: Diagnosis not present

## 2019-11-04 DIAGNOSIS — N182 Chronic kidney disease, stage 2 (mild): Secondary | ICD-10-CM

## 2019-11-04 NOTE — Progress Notes (Signed)
Subjective: Mitchell Gibbs presents today for follow up of at risk foot care. Pt has h/o NIDDM with chronic kidney disease and callus(es) of both feet and painful mycotic toenails b/l that are difficult to trim. Pain interferes with ambulation. Aggravating factors include wearing enclosed shoe gear. Pain is relieved with periodic professional debridement.   He states he did not check his blood sugar this morning. Last A1c was 6.5% last month.  PCP is Mitchell Mustache, NP, and last visit was 10/10/2019.  He voices no new pedal problems on today's visit.  No Known Allergies   Objective: There were no vitals filed for this visit.  Pt is a pleasant 69 y.o. AA male, WD, WN in NAD. AAO x 3.  Vascular Examination:  Capillary refill time to digits immediate b/l. Palpable DP pulses b/l. Palpable PT pulses b/l. Pedal hair sparse b/l. Skin temperature gradient within normal limits b/l. No edema noted b/l. No pain with calf compression b/l.  Dermatological Examination: Pedal skin with normal turgor, texture and tone bilaterally. No open wounds bilaterally. No interdigital macerations bilaterally. Toenails 1-5 b/l elongated, discolored, dystrophic, thickened, crumbly with subungual debris and tenderness to dorsal palpation. Hyperkeratotic lesion(s) submet head 1 left foot, submet head 1 right foot, submet head 3 left foot and submet head 5 right foot.  No erythema, no edema, no drainage, no flocculence.  Musculoskeletal: Normal muscle strength 5/5 to all lower extremity muscle groups bilaterally, no pain crepitus or joint limitation noted with ROM b/l, bunion deformity noted b/l and hammertoes noted to the  2-5 bilaterally.  Neurological: Protective sensation intact 5/5 intact with 10g monofilament b/l. Vibratory sensation intact b/l. Proprioception intact bilaterally. Babinski reflex negative b/l. Clonus negative b/l.  Assessment: 1. Pain due to onychomycosis of toenails of both feet   2. Callus    3. Type 2 diabetes mellitus with stage 2 chronic kidney disease, with long-term current use of insulin (Mitchell Gibbs)    Plan: -Examined patient. -No new findings. No new orders. -Continue diabetic foot care principles. -Toenails 1-5 b/l were debrided in length and girth with sterile nail nippers and dremel without iatrogenic bleeding.  -Callus(es) submet head 1 left foot, submet head 1 right foot, submet head 3 left foot and submet head 5 right foot pared utilizing sterile scalpel blade without complication or incident. Total number debrided =4. -Patient to report any pedal injuries to medical professional immediately. -Patient to continue soft, supportive shoe gear daily. -Patient/POA to call should there be question/concern in the interim.  Return in about 3 months (around 02/04/2020) for diabetic toenails, corn(s)/callus(es).

## 2019-11-14 ENCOUNTER — Other Ambulatory Visit: Payer: Self-pay | Admitting: Nurse Practitioner

## 2019-12-22 LAB — HM DIABETES EYE EXAM

## 2020-01-01 ENCOUNTER — Other Ambulatory Visit: Payer: Self-pay | Admitting: Nurse Practitioner

## 2020-01-01 DIAGNOSIS — E1122 Type 2 diabetes mellitus with diabetic chronic kidney disease: Secondary | ICD-10-CM

## 2020-01-01 DIAGNOSIS — Z794 Long term (current) use of insulin: Secondary | ICD-10-CM

## 2020-01-02 ENCOUNTER — Other Ambulatory Visit: Payer: Self-pay | Admitting: Nurse Practitioner

## 2020-01-02 DIAGNOSIS — Z794 Long term (current) use of insulin: Secondary | ICD-10-CM

## 2020-01-02 DIAGNOSIS — E1122 Type 2 diabetes mellitus with diabetic chronic kidney disease: Secondary | ICD-10-CM

## 2020-01-24 ENCOUNTER — Other Ambulatory Visit: Payer: Self-pay | Admitting: Nurse Practitioner

## 2020-01-24 DIAGNOSIS — E782 Mixed hyperlipidemia: Secondary | ICD-10-CM

## 2020-01-28 ENCOUNTER — Other Ambulatory Visit: Payer: Self-pay | Admitting: Nurse Practitioner

## 2020-02-04 ENCOUNTER — Other Ambulatory Visit: Payer: Self-pay | Admitting: Nurse Practitioner

## 2020-02-10 ENCOUNTER — Encounter: Payer: Self-pay | Admitting: Podiatry

## 2020-02-10 ENCOUNTER — Ambulatory Visit: Payer: Medicare PPO | Admitting: Podiatry

## 2020-02-10 ENCOUNTER — Other Ambulatory Visit: Payer: Self-pay

## 2020-02-10 DIAGNOSIS — E1122 Type 2 diabetes mellitus with diabetic chronic kidney disease: Secondary | ICD-10-CM

## 2020-02-10 DIAGNOSIS — B351 Tinea unguium: Secondary | ICD-10-CM | POA: Diagnosis not present

## 2020-02-10 DIAGNOSIS — M79674 Pain in right toe(s): Secondary | ICD-10-CM | POA: Diagnosis not present

## 2020-02-10 DIAGNOSIS — Z794 Long term (current) use of insulin: Secondary | ICD-10-CM

## 2020-02-10 DIAGNOSIS — N182 Chronic kidney disease, stage 2 (mild): Secondary | ICD-10-CM

## 2020-02-10 DIAGNOSIS — L84 Corns and callosities: Secondary | ICD-10-CM

## 2020-02-10 DIAGNOSIS — M79675 Pain in left toe(s): Secondary | ICD-10-CM | POA: Diagnosis not present

## 2020-02-10 NOTE — Progress Notes (Signed)
Subjective: Mitchell Gibbs presents today for follow up of at risk foot care. Mitchell Gibbs has h/o NIDDM with chronic kidney disease and callus(es) of both feet and painful mycotic toenails b/l that are difficult to trim. Pain interferes with ambulation. Aggravating factors include wearing enclosed shoe gear. Pain is relieved with periodic professional debridement.   Mitchell Gibbs states his blood sugar was 98 mg/dl this morning.  PCP is Sherrie Mustache, NP, and last visit was 10/10/2019.  Mitchell Gibbs no new pedal problems on today's visit.  No Known Allergies   Objective: There were no vitals filed for this visit.  Mitchell Gibbs is a pleasant 70 y.o. AA male, WD, WN in NAD. AAO x 3.  Vascular Examination:  Capillary refill time to digits immediate b/l. Palpable DP pulses b/l. Palpable Mitchell Gibbs pulses b/l. Pedal hair sparse b/l. Skin temperature gradient within normal limits b/l. No edema noted b/l. No pain with calf compression b/l.  Dermatological Examination: Pedal skin with normal turgor, texture and tone bilaterally. No open wounds bilaterally. No interdigital macerations bilaterally. Toenails 1-5 b/l elongated, discolored, dystrophic, thickened, crumbly with subungual debris and tenderness to dorsal palpation. Hyperkeratotic lesion(s) submet head 1 left foot, submet head 1 right foot, submet head 3 left foot and submet head 5 right foot.  No erythema, no edema, no drainage, no flocculence.  Musculoskeletal: Normal muscle strength 5/5 to all lower extremity muscle groups bilaterally, no pain crepitus or joint limitation noted with ROM b/l, bunion deformity noted b/l and hammertoes noted to the  2-5 bilaterally.  Neurological: Protective sensation intact 5/5 intact with 10g monofilament b/l. Vibratory sensation intact b/l. Proprioception intact bilaterally. Babinski reflex negative b/l. Clonus negative b/l.  Assessment: 1. Pain due to onychomycosis of toenails of both feet   2. Callus   3. Type 2 diabetes mellitus with  stage 2 chronic kidney disease, with long-term current use of insulin ([place])     Plan: -Examined patient. -No new findings. No new orders. -Continue diabetic foot care principles. -Toenails 1-5 b/l were debrided in length and girth with sterile nail nippers and dremel without iatrogenic bleeding.  -Callus(es) submet head 1 left foot, submet head 1 right foot, submet head 3 left foot and submet head 5 right foot pared utilizing sterile scalpel blade without complication or incident. Total number debrided =4. -Patient to report any pedal injuries to medical professional immediately. -Patient to continue soft, supportive shoe gear daily. -Mitchell Gibbs to call should there be question/concern in the interim.  Return in about 3 months (around 05/10/2020).

## 2020-03-09 ENCOUNTER — Other Ambulatory Visit: Payer: Self-pay | Admitting: *Deleted

## 2020-03-09 MED ORDER — SILDENAFIL CITRATE 100 MG PO TABS: ORAL_TABLET | ORAL | 1 refills | Status: DC

## 2020-03-09 NOTE — Telephone Encounter (Signed)
Patient called requesting #30 on his medication. Stated that he has always gotten 30 but sometimes they don't have enough in stock and it is the same money.

## 2020-04-05 ENCOUNTER — Other Ambulatory Visit: Payer: Medicare PPO

## 2020-04-05 ENCOUNTER — Other Ambulatory Visit: Payer: Self-pay

## 2020-04-05 DIAGNOSIS — I10 Essential (primary) hypertension: Secondary | ICD-10-CM

## 2020-04-05 DIAGNOSIS — E890 Postprocedural hypothyroidism: Secondary | ICD-10-CM

## 2020-04-05 DIAGNOSIS — E1122 Type 2 diabetes mellitus with diabetic chronic kidney disease: Secondary | ICD-10-CM

## 2020-04-06 LAB — CBC WITH DIFFERENTIAL/PLATELET
Absolute Monocytes: 287 cells/uL (ref 200–950)
Basophils Absolute: 30 cells/uL (ref 0–200)
Basophils Relative: 0.9 %
Eosinophils Absolute: 99 cells/uL (ref 15–500)
Eosinophils Relative: 3 %
HCT: 41.1 % (ref 38.5–50.0)
Hemoglobin: 13.5 g/dL (ref 13.2–17.1)
Lymphs Abs: 1297 cells/uL (ref 850–3900)
MCH: 27.4 pg (ref 27.0–33.0)
MCHC: 32.8 g/dL (ref 32.0–36.0)
MCV: 83.5 fL (ref 80.0–100.0)
MPV: 10 fL (ref 7.5–12.5)
Monocytes Relative: 8.7 %
Neutro Abs: 1587 cells/uL (ref 1500–7800)
Neutrophils Relative %: 48.1 %
Platelets: 208 10*3/uL (ref 140–400)
RBC: 4.92 10*6/uL (ref 4.20–5.80)
RDW: 13.4 % (ref 11.0–15.0)
Total Lymphocyte: 39.3 %
WBC: 3.3 10*3/uL — ABNORMAL LOW (ref 3.8–10.8)

## 2020-04-06 LAB — COMPLETE METABOLIC PANEL WITH GFR
AG Ratio: 1.3 (calc) (ref 1.0–2.5)
ALT: 7 U/L — ABNORMAL LOW (ref 9–46)
AST: 14 U/L (ref 10–35)
Albumin: 3.9 g/dL (ref 3.6–5.1)
Alkaline phosphatase (APISO): 52 U/L (ref 35–144)
BUN/Creatinine Ratio: 9 (calc) (ref 6–22)
BUN: 12 mg/dL (ref 7–25)
CO2: 25 mmol/L (ref 20–32)
Calcium: 9.6 mg/dL (ref 8.6–10.3)
Chloride: 105 mmol/L (ref 98–110)
Creat: 1.29 mg/dL — ABNORMAL HIGH (ref 0.70–1.25)
GFR, Est African American: 66 mL/min/{1.73_m2} (ref >=60)
GFR, Est Non African American: 57 mL/min/{1.73_m2} — ABNORMAL LOW (ref >=60)
Globulin: 3.1 g/dL (calc) (ref 1.9–3.7)
Glucose, Bld: 89 mg/dL (ref 65–99)
Potassium: 4.1 mmol/L (ref 3.5–5.3)
Sodium: 139 mmol/L (ref 135–146)
Total Bilirubin: 0.5 mg/dL (ref 0.2–1.2)
Total Protein: 7 g/dL (ref 6.1–8.1)

## 2020-04-06 LAB — TSH: TSH: 4.84 mIU/L — ABNORMAL HIGH (ref 0.40–4.50)

## 2020-04-06 LAB — HEMOGLOBIN A1C
Hgb A1c MFr Bld: 6.3 % of total Hgb — ABNORMAL HIGH (ref <5.7)
Mean Plasma Glucose: 134 mg/dL
eAG (mmol/L): 7.4 mmol/L

## 2020-04-07 ENCOUNTER — Encounter: Payer: Self-pay | Admitting: Nurse Practitioner

## 2020-04-07 ENCOUNTER — Other Ambulatory Visit: Payer: Self-pay

## 2020-04-07 ENCOUNTER — Ambulatory Visit: Payer: Medicare PPO | Admitting: Nurse Practitioner

## 2020-04-07 VITALS — BP 130/88 | HR 57 | Temp 97.7°F | Ht 72.0 in | Wt 221.0 lb

## 2020-04-07 DIAGNOSIS — E890 Postprocedural hypothyroidism: Secondary | ICD-10-CM

## 2020-04-07 DIAGNOSIS — E663 Overweight: Secondary | ICD-10-CM

## 2020-04-07 DIAGNOSIS — E782 Mixed hyperlipidemia: Secondary | ICD-10-CM

## 2020-04-07 DIAGNOSIS — E1122 Type 2 diabetes mellitus with diabetic chronic kidney disease: Secondary | ICD-10-CM

## 2020-04-07 DIAGNOSIS — Z794 Long term (current) use of insulin: Secondary | ICD-10-CM

## 2020-04-07 DIAGNOSIS — I10 Essential (primary) hypertension: Secondary | ICD-10-CM

## 2020-04-07 DIAGNOSIS — N182 Chronic kidney disease, stage 2 (mild): Secondary | ICD-10-CM

## 2020-04-07 NOTE — Progress Notes (Signed)
`   Careteam: Patient Care Team: [name], NP as PCP - General (Geriatric Medicine) Leighton Ruff, [place] (Optometry)  PLACE OF SERVICE:  [place] Directive information Does Patient Have a Medical Advance Directive?: No, Would patient like information on creating a medical advance directive?: No - Patient declined  No Known Allergies  Chief Complaint  Patient presents with  . Medical Management of Chronic Issues    6 month follow up. Discuss need for COVID booster     HPI: Patient is a 70 y.o. male for routine follow up.   htn- blood pressure 140/90 today, does not take bp at home.  Took medication this morning. Lots of traffic this morning  DM- doing well- one day he woke up at it was 65 did not have symptoms of hypoglycemia- generally fasting around 80. A1c well controlled at 6.3. continues on levemir 44 units with metformin 500 mg  Has had 3 covid vaccines- got booster.  Continues to work on staying active.   Hypothyroid- TSH was elevated 6 months ago however had forgotten to take synthroid for several days close to when lab was drawn. Continues on synthroid 112 mcg, recent TSH 4.84   Hyperlipidemia- continues on lovastatin 40 mg daily LDL 68  Review of Systems:  Review of Systems  Constitutional: Negative for chills, fever and weight loss.  HENT: Negative for tinnitus.   Respiratory: Negative for cough, sputum production and shortness of breath.   Cardiovascular: Negative for chest pain, palpitations and leg swelling.  Gastrointestinal: Negative for abdominal pain, constipation, diarrhea and heartburn.  Genitourinary: Negative for dysuria, frequency and urgency.  Musculoskeletal: Negative for back pain, falls, joint pain and myalgias.  Skin: Negative.   Neurological: Negative for dizziness and headaches.  Psychiatric/Behavioral: Negative for depression and memory loss. The patient does not have insomnia.     Past Medical History:  Diagnosis  Date  . Bell's palsy    2005  . Carpal tunnel syndrome   . Cataract   . CKD stage 2 due to type 2 diabetes mellitus ([place]) 12/01/2015  . Diabetes mellitus without complication ([place])   . Edema   . Gout, unspecified   . Hyperlipidemia   . Hypertension   . Internal hemorrhoids without mention of complication   . Malignant neoplasm of thyroid gland ([place])   . Memory loss   . 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 Obesity, unspecified   . Other malaise and fatigue   . Other psoriasis   . Sleep apnea    no CPAP used 09-23-15  . Supraventricular premature beats   . Thyroid disease   . Trigger finger (acquired)   . Unspecified sleep apnea    Past Surgical History:  Procedure Laterality Date  . CATARACT EXTRACTION W/ INTRAOCULAR LENS IMPLANT Right 06/05/2016   Dr. Arlina Robes  . COLONOSCOPY  10/07/2015   one polyp, Dr. Loletha Carrow  . TOTAL THYROIDECTOMY  2007   Dr. Georgina Quint   Social History:   reports that he quit smoking about 41 years ago. He has never used smokeless tobacco. He reports previous alcohol use. He reports that he does not use drugs.  Family History  Problem Relation Age of Onset  . Cancer - Prostate Father   . Stomach cancer Father   . Hypertension Other   . Colon cancer Neg Hx   . Esophageal cancer Neg Hx   . Rectal cancer Neg Hx     Medications: Patient's Medications  New Prescriptions   No medications on file  Previous Medications   ASPIRIN EC 81 MG TABLET    Take 81 mg by mouth daily.   CYANOCOBALAMIN (B-12) 1000 MCG TABS    Take by mouth daily.   INSULIN DETEMIR (LEVEMIR FLEXTOUCH) 100 UNIT/ML FLEXPEN    Inject 44 Units into the skin at bedtime. DX E11.22 , N18.2 , Z79.4   INSULIN PEN NEEDLE (B-D ULTRAFINE III SHORT PEN) 31G X 8 MM MISC    Use for insulin injections. Dx: E11.22   LEVOTHYROXINE (SYNTHROID) 112 MCG TABLET    TAKE 1 TABLET (112 MCG TOTAL) BY MOUTH DAILY BEFORE BREAKFAST.   LOSARTAN (COZAAR) 100 MG TABLET    TAKE 1 TABLET BY MOUTH EVERY DAY    LOVASTATIN (MEVACOR) 40 MG TABLET    TAKE 1 TABLET DAILY TO CONTROL CHOLESTEROL   METFORMIN (GLUCOPHAGE) 500 MG TABLET    Take one tablet by mouth before breakfast and one tablet before supper to control blood sugar.   NONFORMULARY OR COMPOUNDED ITEM    Antifungal solution: Terbinafine 3%, Fluconazole 2%, Tea Tree Oil 5%, Urea 10%, Ibuprofen 2% in DMSO suspension #51mL   SILDENAFIL (VIAGRA) 100 MG TABLET    TAKE 1 TABLET BY MOUTH DAILY AS NEEDED FOR ERECTILE DYSFUNCTION   UNABLE TO FIND    Med Name: Live and go Diabetic supply check blood sugar once daily   VITAMIN C (ASCORBIC ACID) 250 MG TABLET    Take 500 mg by mouth daily.  Modified Medications   No medications on file  Discontinued Medications   No medications on file    Physical Exam:  Vitals:   04/07/20 0846  BP: 140/90  Pulse: (!) 57  Temp: 97.7 F (36.5 C)  TempSrc: Temporal  SpO2: 97%  Weight: 221 lb (100.2 kg)  Height: 6' (1.829 m)   Body mass index is 29.97 kg/m. Wt Readings from Last 3 Encounters:  04/07/20 221 lb (100.2 kg)  10/10/19 224 lb (101.6 kg)  06/09/19 216 lb (98 kg)    Physical Exam Constitutional:      General: He is not in acute distress.    Appearance: He is well-developed. He is not diaphoretic.  HENT:     Head: Normocephalic and atraumatic.     Mouth/Throat:     Pharynx: No oropharyngeal exudate.  Eyes:     Conjunctiva/sclera: Conjunctivae normal.     Pupils: Pupils are equal, round, and reactive to light.  Cardiovascular:     Rate and Rhythm: Normal rate and regular rhythm.     Heart sounds: Normal heart sounds.  Pulmonary:     Effort: Pulmonary effort is normal.     Breath sounds: Normal breath sounds.  Abdominal:     General: Bowel sounds are normal.     Palpations: Abdomen is soft.  Musculoskeletal:        General: No tenderness.     Cervical back: Normal range of motion and neck supple.  Skin:    General: Skin is warm and dry.  Neurological:     Mental Status: He is alert  and oriented to person, place, and time.  Psychiatric:        Mood and Affect: Mood normal.        Behavior: Behavior normal.     Labs reviewed: Basic Metabolic Panel: Recent Labs    10/06/19 0804 04/05/20 0933  NA 139 139  K 4.0 4.1  CL 102 105  CO2 30 25  GLUCOSE 79 89  BUN 13 12  CREATININE 1.26* 1.29*  CALCIUM 9.5 9.6  TSH 13.11* 4.84*   Liver Function Tests: Recent Labs    10/06/19 0804 04/05/20 0933  AST 15 14  ALT 9 7*  BILITOT 0.4 0.5  PROT 6.8 7.0   No results for input(s): LIPASE, AMYLASE in the last 8760 hours. No results for input(s): AMMONIA in the last 8760 hours. CBC: Recent Labs    10/06/19 0804 04/05/20 0933  WBC 3.9 3.3*  NEUTROABS 1,853 1,587  HGB 13.2 13.5  HCT 40.5 41.1  MCV 84.2 83.5  PLT 224 208   Lipid Panel: Recent Labs    10/06/19 0804  CHOL 146  HDL 66  LDLCALC 68  TRIG 50  CHOLHDL 2.2   TSH: Recent Labs    10/06/19 0804 04/05/20 0933  TSH 13.11* 4.84*   A1C: Lab Results  Component Value Date   HGBA1C 6.3 (H) 04/05/2020     Assessment/Plan 1. Type 2 diabetes mellitus with stage 2 chronic kidney disease, with long-term current use of insulin (HCC) Well controlled on current regimen. Continue levemir and metformin. Encouraged dietary compliance, routine foot care/monitoring and to keep up with diabetic eye exams through ophthalmology  - Hemoglobin A1c; Future  2. Postoperative hypothyroidism TSH mildly elevated, continues synthroid 112 mcg.  - TSH; Future  3. Mixed hyperlipidemia LDL at goal in September, continues on statin with deitary modifications.  - COMPLETE METABOLIC PANEL WITH GFR; Future - Lipid Panel; Future  4. Essential hypertension -well controlled on current regimen continues on losartan 100 mg daily  - CBC with Differential/Platelet; Future - COMPLETE METABOLIC PANEL WITH GFR; Future  5. Overweight with body mass index (BMI) 25.0-29.9 Continues to work on healthy lifestyle and weight  management through diet and exercise.   Next appt: 6 months, labs prior  Mitsuye Schrodt K. [place], [facility] [phone]

## 2020-04-08 ENCOUNTER — Ambulatory Visit: Payer: Medicare PPO | Admitting: Nurse Practitioner

## 2020-05-20 ENCOUNTER — Other Ambulatory Visit: Payer: Self-pay | Admitting: Nurse Practitioner

## 2020-05-20 DIAGNOSIS — I10 Essential (primary) hypertension: Secondary | ICD-10-CM

## 2020-05-25 ENCOUNTER — Other Ambulatory Visit: Payer: Self-pay

## 2020-05-25 ENCOUNTER — Encounter: Payer: Self-pay | Admitting: Podiatry

## 2020-05-25 ENCOUNTER — Ambulatory Visit: Payer: Medicare PPO | Admitting: Podiatry

## 2020-05-25 DIAGNOSIS — B351 Tinea unguium: Secondary | ICD-10-CM | POA: Diagnosis not present

## 2020-05-25 DIAGNOSIS — M79674 Pain in right toe(s): Secondary | ICD-10-CM | POA: Diagnosis not present

## 2020-05-25 DIAGNOSIS — L84 Corns and callosities: Secondary | ICD-10-CM

## 2020-05-25 DIAGNOSIS — Z794 Long term (current) use of insulin: Secondary | ICD-10-CM

## 2020-05-25 DIAGNOSIS — E1122 Type 2 diabetes mellitus with diabetic chronic kidney disease: Secondary | ICD-10-CM | POA: Diagnosis not present

## 2020-05-25 DIAGNOSIS — N182 Chronic kidney disease, stage 2 (mild): Secondary | ICD-10-CM | POA: Diagnosis not present

## 2020-05-25 DIAGNOSIS — M79675 Pain in left toe(s): Secondary | ICD-10-CM

## 2020-05-30 NOTE — Progress Notes (Signed)
  Subjective:  Patient ID: Mitchell Gibbs, male    DOB: [birth year],  MRN: [id number]  70 y.o. male presents with at risk foot care. Pt has h/o NIDDM with chronic kidney disease and callus(es) b/l feet and painful thick toenails that are difficult to trim. Painful toenails interfere with ambulation. Aggravating factors include wearing enclosed shoe gear. Pain is relieved with periodic professional debridement. Painful calluses are aggravated when weightbearing with and without shoegear. Pain is relieved with periodic professional debridement..    Patient's blood sugar was 105 mg/dl on yesterday morning.  PCP: [name], NP and last visit was: one month ago per patient recall.  Review of Systems: Negative except as noted in the HPI.   No Known Allergies  Objective:  There were no vitals filed for this visit. Constitutional Patient is a pleasant 70 y.o. African American male WD, WN in NAD. AAO x 3.  Vascular Capillary refill time to digits immediate b/l. Palpable pedal pulses b/l LE. Pedal hair sparse. Lower extremity skin temperature gradient within normal limits. No pain with calf compression b/l. No edema noted b/l lower extremities. No cyanosis or clubbing noted.  Neurologic Normal speech. Protective sensation intact 5/5 intact bilaterally with 10g monofilament b/l. Vibratory sensation intact b/l.  Dermatologic Pedal skin with normal turgor, texture and tone bilaterally. No open wounds bilaterally. No interdigital macerations bilaterally. Toenails 1-5 b/l elongated, discolored, dystrophic, thickened, crumbly with subungual debris and tenderness to dorsal palpation. Hyperkeratotic lesion(s) submet head 1 left foot, submet head 1 right foot, submet head 3 left foot and submet head 5 right foot.  No erythema, no edema, no drainage, no fluctuance.  Orthopedic: Normal muscle strength 5/5 to all lower extremity muscle groups bilaterally. No pain crepitus or joint limitation noted with ROM b/l.  Hallux valgus with bunion deformity noted b/l lower extremities. Hammertoes noted to the 2-5 bilaterally.   Hemoglobin A1C Latest Ref Rng & Units 04/05/2020 10/06/2019  HGBA1C <5.7 % of total Hgb 6.3(H) 6.5(H)  Some recent data might be hidden   Assessment:   1. Pain due to onychomycosis of toenails of both feet   2. Callus   3. Type 2 diabetes mellitus with stage 2 chronic kidney disease, with long-term current use of insulin ([place])    Plan:  Patient was evaluated and treated and all questions answered.  Onychomycosis with pain -Nails palliatively debridement as below. -Educated on self-care  Procedure: Nail Debridement Rationale: Pain Type of Debridement: manual, sharp debridement. Instrumentation: Nail nipper, rotary burr. Number of Nails: 10  -Examined patient. -No new findings. No new orders. -Continue diabetic foot care principles. -Patient to continue soft, supportive shoe gear daily. -Toenails 1-5 b/l were debrided in length and girth with sterile nail nippers and dremel without iatrogenic bleeding.  -Callus(es) submet head 1 left foot, submet head 1 right foot, submet head 3 left foot and submet head 5 right foot pared utilizing sterile scalpel blade without complication or incident. Total number debrided =4. -Patient to report any pedal injuries to medical professional immediately. -Patient/POA to call should there be question/concern in the interim.  Return in about 3 months (around 08/25/2020).  [name], DPM

## 2020-06-14 ENCOUNTER — Emergency Department (HOSPITAL_COMMUNITY): Payer: Medicare PPO

## 2020-06-14 ENCOUNTER — Emergency Department (HOSPITAL_COMMUNITY)
Admission: EM | Admit: 2020-06-14 | Discharge: 2020-06-15 | Disposition: A | Discharge status: Home / Self Care | Payer: Medicare PPO | Attending: Emergency Medicine | Admitting: Emergency Medicine

## 2020-06-14 ENCOUNTER — Encounter (HOSPITAL_COMMUNITY): Payer: Self-pay | Admitting: Emergency Medicine

## 2020-06-14 DIAGNOSIS — N182 Chronic kidney disease, stage 2 (mild): Secondary | ICD-10-CM | POA: Insufficient documentation

## 2020-06-14 DIAGNOSIS — Z7984 Long term (current) use of oral hypoglycemic drugs: Secondary | ICD-10-CM | POA: Insufficient documentation

## 2020-06-14 DIAGNOSIS — U071 COVID-19: Secondary | ICD-10-CM | POA: Insufficient documentation

## 2020-06-14 DIAGNOSIS — Z79899 Other long term (current) drug therapy: Secondary | ICD-10-CM | POA: Insufficient documentation

## 2020-06-14 DIAGNOSIS — B349 Viral infection, unspecified: Secondary | ICD-10-CM | POA: Diagnosis not present

## 2020-06-14 DIAGNOSIS — R509 Fever, unspecified: Secondary | ICD-10-CM | POA: Diagnosis present

## 2020-06-14 DIAGNOSIS — Z794 Long term (current) use of insulin: Secondary | ICD-10-CM | POA: Diagnosis not present

## 2020-06-14 DIAGNOSIS — Z87891 Personal history of nicotine dependence: Secondary | ICD-10-CM | POA: Insufficient documentation

## 2020-06-14 DIAGNOSIS — Z7982 Long term (current) use of aspirin: Secondary | ICD-10-CM | POA: Insufficient documentation

## 2020-06-14 DIAGNOSIS — E1122 Type 2 diabetes mellitus with diabetic chronic kidney disease: Secondary | ICD-10-CM | POA: Insufficient documentation

## 2020-06-14 DIAGNOSIS — I129 Hypertensive chronic kidney disease with stage 1 through stage 4 chronic kidney disease, or unspecified chronic kidney disease: Secondary | ICD-10-CM | POA: Insufficient documentation

## 2020-06-14 LAB — COMPREHENSIVE METABOLIC PANEL
ALT: 34 U/L (ref 0–44)
ALT: 14 U/L (ref 0–44)
AST: 24 U/L (ref 15–41)
AST: 19 U/L (ref 15–41)
Albumin: 3.3 g/dL — ABNORMAL LOW (ref 3.5–5.0)
Albumin: 3.9 g/dL (ref 3.5–5.0)
Alkaline Phosphatase: 207 U/L — ABNORMAL HIGH (ref 38–126)
Alkaline Phosphatase: 56 U/L (ref 38–126)
Anion gap: 13 (ref 5–15)
Anion gap: 6 (ref 5–15)
BUN: 12 mg/dL (ref 8–23)
BUN: 17 mg/dL (ref 8–23)
CO2: 23 mmol/L (ref 22–32)
CO2: 28 mmol/L (ref 22–32)
Calcium: 8.8 mg/dL — ABNORMAL LOW (ref 8.9–10.3)
Calcium: 9.4 mg/dL (ref 8.9–10.3)
Chloride: 100 mmol/L (ref 98–111)
Chloride: 103 mmol/L (ref 98–111)
Creatinine, Ser: 1.13 mg/dL (ref 0.61–1.24)
Creatinine, Ser: 1.29 mg/dL — ABNORMAL HIGH (ref 0.61–1.24)
GFR, Estimated: >60 mL/min (ref >60)
GFR, Estimated: >60 mL/min (ref >60)
Glucose, Bld: 328 mg/dL — ABNORMAL HIGH (ref 70–99)
Glucose, Bld: 89 mg/dL (ref 70–99)
Potassium: 4 mmol/L (ref 3.5–5.1)
Potassium: 3.9 mmol/L (ref 3.5–5.1)
Sodium: 136 mmol/L (ref 135–145)
Sodium: 137 mmol/L (ref 135–145)
Total Bilirubin: 1.2 mg/dL (ref 0.3–1.2)
Total Bilirubin: 0.7 mg/dL (ref 0.3–1.2)
Total Protein: 6.4 g/dL — ABNORMAL LOW (ref 6.5–8.1)
Total Protein: 7.7 g/dL (ref 6.5–8.1)

## 2020-06-14 LAB — URINALYSIS, ROUTINE W REFLEX MICROSCOPIC
Bilirubin Urine: NEGATIVE
Glucose, UA: NEGATIVE mg/dL
Hgb urine dipstick: NEGATIVE
Ketones, ur: NEGATIVE mg/dL
Leukocytes,Ua: NEGATIVE
Nitrite: NEGATIVE
Protein, ur: NEGATIVE mg/dL
Specific Gravity, Urine: 1.016 (ref 1.005–1.030)
pH: 5 (ref 5.0–8.0)

## 2020-06-14 LAB — CBC WITH DIFFERENTIAL/PLATELET
Abs Immature Granulocytes: 0.02 10*3/uL (ref 0.00–0.07)
Basophils Absolute: 0 10*3/uL (ref 0.0–0.1)
Basophils Relative: 0 %
Eosinophils Absolute: 0 10*3/uL (ref 0.0–0.5)
Eosinophils Relative: 0 %
HCT: 40.4 % (ref 39.0–52.0)
Hemoglobin: 12.9 g/dL — ABNORMAL LOW (ref 13.0–17.0)
Immature Granulocytes: 0 %
Lymphocytes Relative: 13 %
Lymphs Abs: 0.7 10*3/uL (ref 0.7–4.0)
MCH: 27.1 pg (ref 26.0–34.0)
MCHC: 31.9 g/dL (ref 30.0–36.0)
MCV: 84.9 fL (ref 80.0–100.0)
Monocytes Absolute: 0.4 10*3/uL (ref 0.1–1.0)
Monocytes Relative: 8 %
Neutro Abs: 4.2 10*3/uL (ref 1.7–7.7)
Neutrophils Relative %: 79 %
Platelets: 220 10*3/uL (ref 150–400)
RBC: 4.76 MIL/uL (ref 4.22–5.81)
RDW: 14.6 % (ref 11.5–15.5)
WBC: 5.4 10*3/uL (ref 4.0–10.5)
nRBC: 0 % (ref 0.0–0.2)

## 2020-06-14 LAB — LACTIC ACID, PLASMA: Lactic Acid, Venous: 1 mmol/L (ref 0.5–1.9)

## 2020-06-14 MED ORDER — ACETAMINOPHEN 325 MG PO TABS
650.0000 mg | ORAL_TABLET | Freq: Once | ORAL | Status: AC
  Administered 2020-06-14: 650 mg via ORAL
  Filled 2020-06-14: qty 2

## 2020-06-14 NOTE — ED Triage Notes (Signed)
Patient reports fatigue and chills today. Febrile in triage. Denies cough, chest pain, SOB.

## 2020-06-14 NOTE — Discharge Instructions (Addendum)
Your work-up today was overall reassuring.  Your COVID and flu test are still pending.  If your COVID test is positive, you will receive a call.  If this is positive, you will need to quarantine for additional 7 to 10 days.  You will also be able to follow-up on these results via the MyChart app.  Please make sure to drink plenty of fluids.  You may take Tylenol or ibuprofen for fever, over-the-counter cold and flu medicines for body aches.  Please make sure to follow-up with your primary care doctor.  Return to the ER for any new or worsening symptoms.

## 2020-06-14 NOTE — ED Provider Notes (Signed)
[facility] DEPT Provider Note   CSN: [id number] Arrival date & time: 06/14/20  2101     History Chief Complaint  Patient presents with  . Fever    Mitchell Gibbs is a 70 y.o. male.  HPI 70 year old male with history of CKD stage II, DM type II, hyperlipidemia, hypertension presents to the ER with complaints of fatigue, nasal congestion, chills which began this afternoon.  Patient denies any cough, dumping, nausea, vomiting, dysuria or hematuria.  No chest pain or shortness of breath.  States he felt so poorly that he could not go to his mother's funeral today.  Denies any other symptoms.  He is fully vaccinated and boosted for COVID-19    Past Medical History:  Diagnosis Date  . Bell's palsy    2005  . Carpal tunnel syndrome   . Cataract   . CKD stage 2 due to type 2 diabetes mellitus ([place]) 12/01/2015  . Diabetes mellitus without complication ([place])   . Edema   . Gout, unspecified   . Hyperlipidemia   . Hypertension   . Internal hemorrhoids without mention of complication   . Malignant neoplasm of thyroid gland ([place])   . Memory loss   . 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 Obesity, unspecified   . Other malaise and fatigue   . Other psoriasis   . Sleep apnea    no CPAP used 09-23-15  . Supraventricular premature beats   . Thyroid disease   . Trigger finger (acquired)   . Unspecified sleep apnea     Patient Active Problem List   Diagnosis Date Noted  . Postoperative hypothyroidism 05/16/2017  . High risk medication use 05/16/2017  . CKD stage 2 due to type 2 diabetes mellitus ([place]) 12/01/2015  . Cerumen impaction 07/28/2015  . BPPV (benign paroxysmal positional vertigo) 10/06/2014  . Pain in finger of left hand 07/28/2014  . Flank pain 05/12/2014  . Tinea pedis of left foot 02/09/2014  . Chest pain, unspecified 05/07/2013  . Immunization due 04/15/2013  . Erectile dysfunction 03/12/2013  . DM (diabetes mellitus), type 2 with  renal complications ([place]) 61/44/3154  . Edema 08/13/2012  . Hypertension   . Memory loss   . Obesity   . Unspecified sleep apnea   . Hyperlipidemia     Past Surgical History:  Procedure Laterality Date  . CATARACT EXTRACTION W/ INTRAOCULAR LENS IMPLANT Right 06/05/2016   Dr. Arlina Robes  . COLONOSCOPY  10/07/2015   one polyp, Dr. Loletha Carrow  . TOTAL THYROIDECTOMY  2007   Dr. Georgina Quint       Family History  Problem Relation Age of Onset  . Cancer - Prostate Father   . Stomach cancer Father   . Hypertension Other   . Colon cancer Neg Hx   . Esophageal cancer Neg Hx   . Rectal cancer Neg Hx     Social History   Tobacco Use  . Smoking status: Former Smoker    Quit date: 10/27/1978    Years since quitting: 41.6  . Smokeless tobacco: Never Used  Vaping Use  . Vaping Use: Never used  Substance Use Topics  . Alcohol use: Not Currently    Comment: rarely  . Drug use: No    Home Medications Prior to Admission medications   Medication Sig Start Date End Date Taking? Authorizing Provider  aspirin EC 81 MG tablet Take 81 mg by mouth daily.    [provider]  Cyanocobalamin (  B-12) 1000 MCG TABS Take by mouth daily.    [provider]  insulin detemir (LEVEMIR FLEXTOUCH) 100 UNIT/ML FlexPen Inject 44 Units into the skin at bedtime. DX E11.22 , N18.2 , Z79.4 01/01/20   [name], NP  Insulin Pen Needle (B-D ULTRAFINE III SHORT PEN) 31G X 8 MM MISC Use for insulin injections. Dx: E11.22 06/06/18   [name], NP  levothyroxine (SYNTHROID) 112 MCG tablet TAKE 1 TABLET (112 MCG TOTAL) BY MOUTH DAILY BEFORE BREAKFAST. 01/28/20   [name], NP  losartan (COZAAR) 100 MG tablet TAKE 1 TABLET BY MOUTH EVERY DAY 05/20/20   [name], NP  lovastatin (MEVACOR) 40 MG tablet TAKE 1 TABLET DAILY TO CONTROL CHOLESTEROL 01/26/20   [name], NP  metFORMIN (GLUCOPHAGE) 500 MG tablet Take one tablet by mouth before breakfast and one tablet  before supper to control blood sugar. 03/31/19   [name], NP  NONFORMULARY OR COMPOUNDED ITEM Antifungal solution: Terbinafine 3%, Fluconazole 2%, Tea Tree Oil 5%, Urea 10%, Ibuprofen 2% in DMSO suspension #67mL 10/25/18   [name], Stephani Police, DPM  sildenafil (VIAGRA) 100 MG tablet TAKE 1 TABLET BY MOUTH DAILY AS NEEDED FOR ERECTILE DYSFUNCTION 03/09/20   [name], NP  UNABLE TO FIND Med Name: Live and go Diabetic supply check blood sugar once daily    [provider]  vitamin C (ASCORBIC ACID) 250 MG tablet Take 500 mg by mouth daily.    [provider]    Allergies    Patient has no known allergies.  Review of Systems   Review of Systems  Constitutional: Positive for fatigue and fever. Negative for chills.  HENT: Positive for congestion. Negative for ear pain and sore throat.   Eyes: Negative for pain and visual disturbance.  Respiratory: Negative for cough and shortness of breath.   Cardiovascular: Negative for chest pain and palpitations.  Gastrointestinal: Negative for abdominal pain and vomiting.  Genitourinary: Negative for dysuria and hematuria.  Musculoskeletal: Negative for arthralgias and back pain.  Skin: Negative for color change and rash.  Neurological: Negative for seizures and syncope.  All other systems reviewed and are negative.   Physical Exam Updated Vital Signs BP 130/79   Pulse 78   Temp (!) 101.4 F (38.6 C) (Oral)   Resp 18   SpO2 100%   Physical Exam Vitals and nursing note reviewed.  Constitutional:      Appearance: He is well-developed.  HENT:     Head: Normocephalic and atraumatic.  Eyes:     Conjunctiva/sclera: Conjunctivae normal.  Cardiovascular:     Rate and Rhythm: Normal rate and regular rhythm.     Heart sounds: No murmur heard.   Pulmonary:     Effort: Pulmonary effort is normal. No respiratory distress.     Breath sounds: Normal breath sounds.  Abdominal:     Palpations: Abdomen is soft.      Tenderness: There is no abdominal tenderness.  Musculoskeletal:        General: Normal range of motion.     Cervical back: Neck supple.  Skin:    General: Skin is warm and dry.  Neurological:     General: No focal deficit present.     Mental Status: He is alert and oriented to person, place, and time.  Psychiatric:        Mood and Affect: Mood normal.        Behavior: Behavior normal.  ED Results / Procedures / Treatments   Labs (all labs ordered are listed, but only abnormal results are displayed) Labs Reviewed  COMPREHENSIVE METABOLIC PANEL - Abnormal; Notable for the following components:      Result Value   Glucose, Bld 328 (*)    Calcium 8.8 (*)    Total Protein 6.4 (*)    Albumin 3.3 (*)    Alkaline Phosphatase 207 (*)    All other components within normal limits  CBC WITH DIFFERENTIAL/PLATELET - Abnormal; Notable for the following components:   Hemoglobin 12.9 (*)    All other components within normal limits  COMPREHENSIVE METABOLIC PANEL - Abnormal; Notable for the following components:   Creatinine, Ser 1.29 (*)    All other components within normal limits  RESP PANEL BY RT-PCR (FLU A&B, COVID) ARPGX2  CULTURE, BLOOD (SINGLE)  URINALYSIS, ROUTINE W REFLEX MICROSCOPIC  LACTIC ACID, PLASMA  CBC WITH DIFFERENTIAL/PLATELET    EKG None  Radiology DG Chest Portable 1 View  Result Date: 06/14/2020 CLINICAL DATA:  Fevers and chills, initial encounter EXAM: PORTABLE CHEST 1 VIEW COMPARISON:  02/05/2013 FINDINGS: The heart size and mediastinal contours are within normal limits. Both lungs are clear. The visualized skeletal structures are unremarkable. IMPRESSION: No active disease. Electronically Signed   By: Inez Catalina M.D.   On: 06/14/2020 22:02    Procedures Procedures   Medications Ordered in ED Medications  acetaminophen (TYLENOL) tablet 650 mg (650 mg Oral Given 06/14/20 2145)    ED Course  I have reviewed the triage vital signs and the nursing  notes.  Pertinent labs & imaging results that were available during my care of the patient were reviewed by me and considered in my medical decision making (see chart for details).    MDM Rules/Calculators/A&P                         70 year old male presents to the ER with malaise, fatigue, nasal congestion which started today.  On arrival, he is well-appearing, no acute distress, resting comfortably in ER bed.  Physical exam overall reassuring, lung sounds clear, abdomen soft nontender, no CVA tenderness.  Labs and imaging ordered, reviewed and interpreted by me.  CBC without leukocytosis, CMP consistent with his history of renal disease, however no other significant abnormalities noted.  Chest x-ray without evidence of pneumonia.  Lactic acid normal.  UA without evidence of UTI.  COVID and flu are pending.  Suspect viral process.  No signs of sepsis, low suspicion for abdominal infection, pneumonia.  Patient was educated on supportive measures, PCP follow-up, return precautions discussed.  He voiced her standing and is agreeable.  Stable for discharge.  Case discussed with Dr. Ronnald Nian who is agreeable to the above plan and disposition.  Final Clinical Impression(s) / ED Diagnoses Final diagnoses:  Viral illness    Rx / DC Orders ED Discharge Orders    None       Garald Balding, PA-C 06/14/20 2352    Lennice Sites, DO 06/15/20 1921

## 2020-06-15 LAB — RESP PANEL BY RT-PCR (FLU A&B, COVID) ARPGX2
Influenza A by PCR: NEGATIVE
Influenza B by PCR: NEGATIVE
SARS Coronavirus 2 by RT PCR: POSITIVE — AB

## 2020-06-16 ENCOUNTER — Other Ambulatory Visit (HOSPITAL_COMMUNITY): Payer: Self-pay

## 2020-06-16 ENCOUNTER — Telehealth: Payer: Self-pay

## 2020-06-16 ENCOUNTER — Other Ambulatory Visit: Payer: Self-pay | Admitting: Physician Assistant

## 2020-06-16 MED ORDER — NIRMATRELVIR/RITONAVIR (PAXLOVID)TABLET
3.0000 | ORAL_TABLET | Freq: Two times a day (BID) | ORAL | 0 refills | Status: AC
  Filled 2020-06-16: qty 30, 5d supply, fill #0

## 2020-06-16 NOTE — Progress Notes (Signed)
Outpatient Oral COVID Treatment Note  I connected with Mitchell Gibbs on 06/16/2020/11:23 AM by telephone and verified that I am speaking with the correct person using two identifiers.  I discussed the limitations, risks, security, and privacy concerns of performing an evaluation and management service by telephone and the availability of in person appointments. I also discussed with the patient that there may be a patient responsible charge related to this service. The patient expressed understanding and agreed to proceed.  Patient location: home Provider location: [place]  Diagnosis: COVID-19 infection  Purpose of visit: Discussion of potential use of Molnupiravir or Paxlovid, a new treatment for mild to moderate COVID-19 viral infection in non-hospitalized patients.   Subjective: Patient is a 70 y.o. male who has been diagnosed with COVID 19 viral infection.  Their symptoms began on 5/30 with sore throat, fever, cough and congestion.  Past Medical History:  Diagnosis Date  . Bell's palsy    2005  . Carpal tunnel syndrome   . Cataract   . CKD stage 2 due to type 2 diabetes mellitus ([place]) 12/01/2015  . Diabetes mellitus without complication ([place])   . Edema   . Gout, unspecified   . Hyperlipidemia   . Hypertension   . Internal hemorrhoids without mention of complication   . Malignant neoplasm of thyroid gland ([place])   . Memory loss   . 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 Obesity, unspecified   . Other malaise and fatigue   . Other psoriasis   . Sleep apnea    no CPAP used 09-23-15  . Supraventricular premature beats   . Thyroid disease   . Trigger finger (acquired)   . Unspecified sleep apnea     No Known Allergies   Current Outpatient Medications:  .  aspirin EC 81 MG tablet, Take 81 mg by mouth daily., Disp: , Rfl:  .  Cyanocobalamin (B-12) 1000 MCG TABS, Take by mouth daily., Disp: , Rfl:  .  insulin detemir (LEVEMIR FLEXTOUCH) 100 UNIT/ML FlexPen, Inject 44 Units  into the skin at bedtime. DX E11.22 , N18.2 , Z79.4, Disp: 15 mL, Rfl: 11 .  Insulin Pen Needle (B-D ULTRAFINE III SHORT PEN) 31G X 8 MM MISC, Use for insulin injections. Dx: E11.22, Disp: 300 each, Rfl: 3 .  levothyroxine (SYNTHROID) 112 MCG tablet, TAKE 1 TABLET (112 MCG TOTAL) BY MOUTH DAILY BEFORE BREAKFAST., Disp: 90 tablet, Rfl: 1 .  losartan (COZAAR) 100 MG tablet, TAKE 1 TABLET BY MOUTH EVERY DAY, Disp: 90 tablet, Rfl: 1 .  lovastatin (MEVACOR) 40 MG tablet, TAKE 1 TABLET DAILY TO CONTROL CHOLESTEROL, Disp: 90 tablet, Rfl: 1 .  metFORMIN (GLUCOPHAGE) 500 MG tablet, Take one tablet by mouth before breakfast and one tablet before supper to control blood sugar., Disp: 60 tablet, Rfl: 0 .  NONFORMULARY OR COMPOUNDED ITEM, Antifungal solution: Terbinafine 3%, Fluconazole 2%, Tea Tree Oil 5%, Urea 10%, Ibuprofen 2% in DMSO suspension #65mL, Disp: 1 each, Rfl: 3 .  sildenafil (VIAGRA) 100 MG tablet, TAKE 1 TABLET BY MOUTH DAILY AS NEEDED FOR ERECTILE DYSFUNCTION, Disp: 30 tablet, Rfl: 1 .  UNABLE TO FIND, Med Name: Live and go Diabetic supply check blood sugar once daily, Disp: , Rfl:  .  vitamin C (ASCORBIC ACID) 250 MG tablet, Take 500 mg by mouth daily., Disp: , Rfl:   Objective: Patient appears/sounds sick.  They are in no apparent distress.  Breathing is non labored.  Mood and behavior are normal.  Laboratory  Data:  Recent Results (from the past 2160 hour(s))  COMPLETE METABOLIC PANEL WITH GFR     Status: Abnormal   Collection Time: 04/05/20  9:33 AM  Result Value Ref Range   Glucose, Bld 89 65 - 99 mg/dL    Comment: .            Fasting reference interval .    BUN 12 7 - 25 mg/dL   Creat 1.29 (H) 0.70 - 1.25 mg/dL    Comment: For patients >79 years of age, the reference limit for Creatinine is approximately 13% higher for people identified as African-American. .    GFR, Est Non African American 57 (L) > OR = 60 mL/min/1.67m2   GFR, Est African American 66 > OR = 60 mL/min/1.37m2    BUN/Creatinine Ratio 9 6 - 22 (calc)   Sodium 139 135 - 146 mmol/L   Potassium 4.1 3.5 - 5.3 mmol/L   Chloride 105 98 - 110 mmol/L   CO2 25 20 - 32 mmol/L   Calcium 9.6 8.6 - 10.3 mg/dL   Total Protein 7.0 6.1 - 8.1 g/dL   Albumin 3.9 3.6 - 5.1 g/dL   Globulin 3.1 1.9 - 3.7 g/dL (calc)   AG Ratio 1.3 1.0 - 2.5 (calc)   Total Bilirubin 0.5 0.2 - 1.2 mg/dL   Alkaline phosphatase (APISO) 52 35 - 144 U/L   AST 14 10 - 35 U/L   ALT 7 (L) 9 - 46 U/L  CBC with Differential/Platelet     Status: Abnormal   Collection Time: 04/05/20  9:33 AM  Result Value Ref Range   WBC 3.3 (L) 3.8 - 10.8 Thousand/uL   RBC 4.92 4.20 - 5.80 Million/uL   Hemoglobin 13.5 13.2 - 17.1 g/dL   HCT 41.1 38.5 - 50.0 %   MCV 83.5 80.0 - 100.0 fL   MCH 27.4 27.0 - 33.0 pg   MCHC 32.8 32.0 - 36.0 g/dL   RDW 13.4 11.0 - 15.0 %   Platelets 208 140 - 400 Thousand/uL   MPV 10.0 7.5 - 12.5 fL   Neutro Abs 1,587 1,500 - 7,800 cells/uL   Lymphs Abs 1,297 850 - 3,900 cells/uL   Absolute Monocytes 287 200 - 950 cells/uL   Eosinophils Absolute 99 15 - 500 cells/uL   Basophils Absolute 30 0 - 200 cells/uL   Neutrophils Relative % 48.1 %   Total Lymphocyte 39.3 %   Monocytes Relative 8.7 %   Eosinophils Relative 3.0 %   Basophils Relative 0.9 %  TSH     Status: Abnormal   Collection Time: 04/05/20  9:33 AM  Result Value Ref Range   TSH 4.84 (H) 0.40 - 4.50 mIU/L  Hemoglobin A1c     Status: Abnormal   Collection Time: 04/05/20  9:33 AM  Result Value Ref Range   Hgb A1c MFr Bld 6.3 (H) <5.7 % of total Hgb    Comment: For someone without known diabetes, a hemoglobin  A1c value between 5.7% and 6.4% is consistent with prediabetes and should be confirmed with a  follow-up test. . For someone with known diabetes, a value <7% indicates that their diabetes is well controlled. A1c targets should be individualized based on duration of diabetes, age, comorbid conditions, and other considerations. . This assay result  is consistent with an increased risk of diabetes. . Currently, no consensus exists regarding use of hemoglobin A1c for diagnosis of diabetes for children. .    Mean Plasma Glucose 134 mg/dL  eAG (mmol/L) 7.4 mmol/L  Comprehensive metabolic panel     Status: Abnormal   Collection Time: 06/14/20  9:38 PM  Result Value Ref Range   Sodium 136 135 - 145 mmol/L   Potassium 4.0 3.5 - 5.1 mmol/L   Chloride 100 98 - 111 mmol/L   CO2 23 22 - 32 mmol/L   Glucose, Bld 328 (H) 70 - 99 mg/dL    Comment: Glucose reference range applies only to samples taken after fasting for at least 8 hours.   BUN 12 8 - 23 mg/dL   Creatinine, Ser 1.13 0.61 - 1.24 mg/dL   Calcium 8.8 (L) 8.9 - 10.3 mg/dL   Total Protein 6.4 (L) 6.5 - 8.1 g/dL   Albumin 3.3 (L) 3.5 - 5.0 g/dL   AST 24 15 - 41 U/L   ALT 34 0 - 44 U/L   Alkaline Phosphatase 207 (H) 38 - 126 U/L   Total Bilirubin 1.2 0.3 - 1.2 mg/dL   GFR, Estimated >60 >60 mL/min    Comment: (NOTE) Calculated using the CKD-EPI Creatinine Equation (2021)    Anion gap 13 5 - 15    Comment: Performed at [facility], [place] [address], [place], [place] [address]  Resp Panel by RT-PCR (Flu A&B, Covid) Nasopharyngeal Swab     Status: Abnormal   Collection Time: 06/14/20  9:46 PM   Specimen: Nasopharyngeal Swab; Nasopharyngeal(NP) swabs in vial transport medium  Result Value Ref Range   SARS Coronavirus 2 by RT PCR POSITIVE (A) NEGATIVE    Comment: RESULT CALLED TO, READ BACK BY AND VERIFIED WITH: JENNIFER, RN @ [phone] ON 06/15/20 C VARNER (NOTE) SARS-CoV-2 target nucleic acids are DETECTED.  The SARS-CoV-2 RNA is generally detectable in upper respiratory specimens during the acute phase of infection. Positive results are indicative of the presence of the identified virus, but do not rule out bacterial infection or co-infection with other pathogens not detected by the test. Clinical correlation with patient history and other diagnostic  information is necessary to determine patient infection status. The expected result is Negative.  Fact Sheet for Patients: EntrepreneurPulse.com.au  Fact Sheet for Healthcare Providers: IncredibleEmployment.be  This test is not yet approved or cleared by the [place] FDA and  has been authorized for detection and/or diagnosis of SARS-CoV-2 by FDA under an Emergency Use Authorization (EUA).  This EUA will remain in effect (meaning this test  can be used) for the duration of  the COVID-19 declaration under Section 564(b)(1) of the Act, 21 U.S.C. section 360bbb-3(b)(1), unless the authorization is terminated or revoked sooner.     Influenza A by PCR NEGATIVE NEGATIVE   Influenza B by PCR NEGATIVE NEGATIVE    Comment: (NOTE) The Xpert Xpress SARS-CoV-2/FLU/RSV plus assay is intended as an aid in the diagnosis of influenza from Nasopharyngeal swab specimens and should not be used as a sole basis for treatment. Nasal washings and aspirates are unacceptable for Xpert Xpress SARS-CoV-2/FLU/RSV testing.  Fact Sheet for Patients: EntrepreneurPulse.com.au  Fact Sheet for Healthcare Providers: IncredibleEmployment.be  This test is not yet approved or cleared by the [place] FDA and has been authorized for detection and/or diagnosis of SARS-CoV-2 by FDA under an Emergency Use Authorization (EUA). This EUA will remain in effect (meaning this test can be used) for the duration of the COVID-19 declaration under Section 564(b)(1) of the Act, 21 U.S.C. section 360bbb-3(b)(1), unless the authorization is terminated or revoked.  Performed at [facility], [place]  [address], [place], [place] [address]   Comprehensive metabolic panel     Status: Abnormal   Collection Time: 06/14/20 10:00 PM  Result Value Ref Range   Sodium 137 135 - 145 mmol/L   Potassium 3.9 3.5 - 5.1 mmol/L   Chloride 103 98 -  111 mmol/L   CO2 28 22 - 32 mmol/L   Glucose, Bld 89 70 - 99 mg/dL    Comment: Glucose reference range applies only to samples taken after fasting for at least 8 hours.   BUN 17 8 - 23 mg/dL   Creatinine, Ser 1.29 (H) 0.61 - 1.24 mg/dL   Calcium 9.4 8.9 - 10.3 mg/dL   Total Protein 7.7 6.5 - 8.1 g/dL   Albumin 3.9 3.5 - 5.0 g/dL   AST 19 15 - 41 U/L   ALT 14 0 - 44 U/L   Alkaline Phosphatase 56 38 - 126 U/L   Total Bilirubin 0.7 0.3 - 1.2 mg/dL   GFR, Estimated >60 >60 mL/min    Comment: (NOTE) Calculated using the CKD-EPI Creatinine Equation (2021)    Anion gap 6 5 - 15    Comment: Performed at [facility], [place] [address], [place], [place] [address]  Lactic acid, plasma     Status: None   Collection Time: 06/14/20 10:29 PM  Result Value Ref Range   Lactic Acid, Venous 1.0 0.5 - 1.9 mmol/L    Comment: Performed at [facility], [place] [address], [place], [place] [address]  Culture, blood (single)     Status: None (Preliminary result)   Collection Time: 06/14/20 10:29 PM   Specimen: BLOOD  Result Value Ref Range   Specimen Description      BLOOD SPECIMEN SOURCE NOT MARKED ON REQUISITION Performed at [place] [address], [place], [place] [address]    Special Requests      BOTTLES DRAWN AEROBIC AND ANAEROBIC Blood Culture adequate volume Performed at [place] [address], [place], [place] [address]    Culture      NO GROWTH < 24 HOURS Performed at [place] [address], [place], [place] [address]    Report Status PENDING   CBC with Differential/Platelet     Status: Abnormal   Collection Time: 06/14/20 10:29 PM  Result Value Ref Range   WBC 5.4 4.0 - 10.5 K/uL   RBC 4.76 4.22 - 5.81 MIL/uL   Hemoglobin 12.9 (L) 13.0 - 17.0 g/dL   HCT 40.4 39.0 - 52.0 %   MCV 84.9 80.0 - 100.0 fL   MCH 27.1 26.0 - 34.0 pg   MCHC 31.9 30.0 - 36.0 g/dL   RDW 14.6 11.5 - 15.5 %    Platelets 220 150 - 400 K/uL   nRBC 0.0 0.0 - 0.2 %   Neutrophils Relative % 79 %   Neutro Abs 4.2 1.7 - 7.7 K/uL   Lymphocytes Relative 13 %   Lymphs Abs 0.7 0.7 - 4.0 K/uL   Monocytes Relative 8 %   Monocytes Absolute 0.4 0.1 - 1.0 K/uL   Eosinophils Relative 0 %   Eosinophils Absolute 0.0 0.0 - 0.5 K/uL   Basophils Relative 0 %   Basophils Absolute 0.0 0.0 - 0.1 K/uL   Immature Granulocytes 0 %   Abs Immature Granulocytes 0.02 0.00 - 0.07 K/uL    Comment: Performed at [facility], [place] [address], [place],  [address]  Urinalysis, Routine w  reflex microscopic Urine, Clean Catch     Status: None   Collection Time: 06/14/20 11:30 PM  Result Value Ref Range   Color, Urine YELLOW YELLOW   APPearance CLEAR CLEAR   Specific Gravity, Urine 1.016 1.005 - 1.030   pH 5.0 5.0 - 8.0   Glucose, UA NEGATIVE NEGATIVE mg/dL   Hgb urine dipstick NEGATIVE NEGATIVE   Bilirubin Urine NEGATIVE NEGATIVE   Ketones, ur NEGATIVE NEGATIVE mg/dL   Protein, ur NEGATIVE NEGATIVE mg/dL   Nitrite NEGATIVE NEGATIVE   Leukocytes,Ua NEGATIVE NEGATIVE    Comment: Performed at [facility] [address], [place],  [address]     Assessment: 70 y.o. male with mild/moderate COVID 19 viral infection diagnosed on 5/30  at high risk for progression to severe COVID 19.  Plan:  This patient is a 70 y.o. male that meets the following criteria for Emergency Use Authorization of: Paxlovid 1. Age >12 yr AND > 40 kg 2. SARS-COV-2 positive test 3. Symptom onset < 5 days 4. Mild-to-moderate COVID disease with high risk for severe progression to hospitalization or death  I have spoken and communicated the following to the patient or parent/caregiver regarding: 1. Paxlovid is an unapproved drug that is authorized for use under an Emergency Use Authorization.  2. There are no adequate, approved, available products for the treatment of COVID-19 in adults who have  mild-to-moderate COVID-19 and are at high risk for progressing to severe COVID-19, including hospitalization or death. 3. Other therapeutics are currently authorized. For additional information on all products authorized for treatment or prevention of COVID-19, please see [employer]Em[employer]ium.pl.  4. There are benefits and risks of taking this treatment as outlined in the "Fact Sheet for Patients and Caregivers."  5. "Fact Sheet for Patients and Caregivers" was reviewed with patient. A hard copy will be provided to patient from pharmacy prior to the patient receiving treatment. 6. Patients should continue to self-isolate and use infection control measures (e.g., wear mask, isolate, social distance, avoid sharing personal items, clean and disinfect "high touch" surfaces, and frequent handwashing) according to CDC guidelines.  7. The patient or parent/caregiver has the option to accept or refuse treatment. 8. Patient medication history was reviewed for potential drug interactions:Interaction with home meds: Lovastatin and Viagra (will hold) 9. Patient's GFR was calculated to be > 60, and they were therefore prescribed Normal dose (GFR>60) - nirmatrelvir 150mg  tab (2 tablet) by mouth twice daily AND ritonavir 100mg  tab (1 tablet) by mouth twice daily   After reviewing above information with the patient, the patient agrees to receive Paxlovid.  Follow up instructions:    . Take prescription BID x 5 days as directed . Reach out to pharmacist for counseling on medication if desired . For concerns regarding further COVID symptoms please follow up with your PCP or urgent care . For urgent or life-threatening issues, seek care at your local emergency department  The patient was provided an opportunity to ask questions, and all were answered. The patient agreed with the plan and demonstrated an understanding  of the instructions.   Script sent to [facility] and opted to pick up RX.  The patient was advised to call their PCP or seek an in-person evaluation if the symptoms worsen or if the condition fails to improve as anticipated.   I provided 14 minutes of non face-to-face telephone visit time during this encounter, and > 50% was spent counseling as documented under my assessment & plan.  [place], [place] 06/16/2020 /11:23 AM

## 2020-06-16 NOTE — Telephone Encounter (Signed)
Called to discuss with patient about COVID-19 symptoms and the use of one of the available treatments for those with mild to moderate Covid symptoms and at a high risk of hospitalization.  Pt appears to qualify for outpatient treatment due to co-morbid conditions and/or a member of an at-risk group in accordance with the FDA Emergency Use Authorization.    Symptom onset: 06/14/20 Fever,chills,fatigue,nasal congestion Vaccinated: Yes Booster? Yes Immunocompromised? No Qualifiers: CKD,HTN,Obesity NIH Criteria: Tier 1  Pt. Would like to speak with APP.   Mitchell Gibbs

## 2020-06-20 LAB — CULTURE, BLOOD (SINGLE)
Culture: NO GROWTH
Special Requests: ADEQUATE

## 2020-06-23 ENCOUNTER — Other Ambulatory Visit: Payer: Self-pay

## 2020-06-23 ENCOUNTER — Encounter: Payer: Self-pay | Admitting: Nurse Practitioner

## 2020-06-23 ENCOUNTER — Other Ambulatory Visit (HOSPITAL_COMMUNITY): Payer: Self-pay

## 2020-06-23 ENCOUNTER — Ambulatory Visit: Payer: Medicare PPO | Admitting: Nurse Practitioner

## 2020-06-23 VITALS — BP 132/88 | HR 66 | Temp 96.8°F | Ht 72.0 in | Wt 204.0 lb

## 2020-06-23 DIAGNOSIS — K5904 Chronic idiopathic constipation: Secondary | ICD-10-CM | POA: Diagnosis not present

## 2020-06-23 DIAGNOSIS — U071 COVID-19: Secondary | ICD-10-CM

## 2020-06-23 NOTE — Progress Notes (Signed)
Careteam: Patient Care Team: [name], NP as PCP - General (Geriatric Medicine) Leighton Ruff, [place] (Optometry)  PLACE OF SERVICE:  [place] Directive information Does Patient Have a Medical Advance Directive?: No, Would patient like information on creating a medical advance directive?: Yes (MAU/Ambulatory/Procedural Areas - Information given)  No Known Allergies  Chief Complaint  Patient presents with  . Acute Visit    Follow-up from covid. Patient states he feels better and has no real concerns.      HPI: Patient is a 70 y.o. male for follow up [place]. Had temperature for 1 day. Took paxloid with OTC medication. No adverse effects. Felt better after taking the paxlovid   Reports decrease appetite which has happened over time. He has decrease intake and lost weight.   No chest pains, shortness of breath, no ongoing cough or congestion.    His mother died in the middle of him having COVID.   Review of Systems:  Review of Systems  Constitutional: Negative for chills, fever and weight loss.  HENT: Negative for tinnitus.   Respiratory: Negative for cough, sputum production and shortness of breath.   Cardiovascular: Negative for chest pain, palpitations and leg swelling.  Gastrointestinal: Negative for abdominal pain, constipation, diarrhea and heartburn.  Genitourinary: Negative for dysuria, frequency and urgency.  Musculoskeletal: Negative for back pain, falls, joint pain and myalgias.  Skin: Negative.   Neurological: Negative for dizziness and headaches.  Psychiatric/Behavioral: Negative for depression and memory loss. The patient does not have insomnia.    Past Medical History:  Diagnosis Date  . Bell's palsy    2005  . Carpal tunnel syndrome   . Cataract   . CKD stage 2 due to type 2 diabetes mellitus ([place]) 12/01/2015  . Diabetes mellitus without complication ([place])   . Edema   . Gout, unspecified   . Hyperlipidemia   . Hypertension   .  Internal hemorrhoids without mention of complication   . Malignant neoplasm of thyroid gland ([place])   . Memory loss   . 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 Obesity, unspecified   . Other malaise and fatigue   . Other psoriasis   . Sleep apnea    no CPAP used 09-23-15  . Supraventricular premature beats   . Thyroid disease   . Trigger finger (acquired)   . Unspecified sleep apnea    Past Surgical History:  Procedure Laterality Date  . CATARACT EXTRACTION W/ INTRAOCULAR LENS IMPLANT Right 06/05/2016   Dr. Arlina Robes  . COLONOSCOPY  10/07/2015   one polyp, Dr. Loletha Carrow  . TOTAL THYROIDECTOMY  2007   Dr. Georgina Quint   Social History:   reports that he quit smoking about 41 years ago. He has never used smokeless tobacco. He reports previous alcohol use. He reports that he does not use drugs.  Family History  Problem Relation Age of Onset  . Cancer - Prostate Father   . Stomach cancer Father   . Hypertension Other   . Colon cancer Neg Hx   . Esophageal cancer Neg Hx   . Rectal cancer Neg Hx     Medications: Patient's Medications  New Prescriptions   No medications on file  Previous Medications   ASPIRIN EC 81 MG TABLET    Take 81 mg by mouth daily.   B COMPLEX VITAMINS CAPSULE    Take 1 capsule by mouth daily.   CYANOCOBALAMIN (B-12) 1000 MCG TABS    Take  by mouth daily.   INSULIN DETEMIR (LEVEMIR FLEXTOUCH) 100 UNIT/ML FLEXPEN    Inject 44 Units into the skin at bedtime. DX E11.22 , N18.2 , Z79.4   INSULIN PEN NEEDLE (B-D ULTRAFINE III SHORT PEN) 31G X 8 MM MISC    Use for insulin injections. Dx: E11.22   LEVOTHYROXINE (SYNTHROID) 112 MCG TABLET    TAKE 1 TABLET (112 MCG TOTAL) BY MOUTH DAILY BEFORE BREAKFAST.   LOSARTAN (COZAAR) 100 MG TABLET    TAKE 1 TABLET BY MOUTH EVERY DAY   LOVASTATIN (MEVACOR) 40 MG TABLET    TAKE 1 TABLET DAILY TO CONTROL CHOLESTEROL   METFORMIN (GLUCOPHAGE) 500 MG TABLET    Take one tablet by mouth before breakfast and one tablet before supper to  control blood sugar.   NONFORMULARY OR COMPOUNDED ITEM    Antifungal solution: Terbinafine 3%, Fluconazole 2%, Tea Tree Oil 5%, Urea 10%, Ibuprofen 2% in DMSO suspension #28mL   SILDENAFIL (VIAGRA) 100 MG TABLET    TAKE 1 TABLET BY MOUTH DAILY AS NEEDED FOR ERECTILE DYSFUNCTION   VITAMIN C (ASCORBIC ACID) 250 MG TABLET    Take 500 mg by mouth daily.  Modified Medications   No medications on file  Discontinued Medications   UNABLE TO FIND    Med Name: Live and go Diabetic supply check blood sugar once daily    Physical Exam:  Vitals:   06/23/20 1312  BP: 132/88  Pulse: 66  Temp: (!) 96.8 F (36 C)  TempSrc: Temporal  SpO2: 99%  Weight: 204 lb (92.5 kg)  Height: 6' (1.829 m)   Body mass index is 27.67 kg/m. Wt Readings from Last 3 Encounters:  06/23/20 204 lb (92.5 kg)  04/07/20 221 lb (100.2 kg)  10/10/19 224 lb (101.6 kg)    Physical Exam Constitutional:      General: He is not in acute distress.    Appearance: He is well-developed. He is not diaphoretic.  HENT:     Head: Normocephalic and atraumatic.     Mouth/Throat:     Pharynx: No oropharyngeal exudate.  Eyes:     Conjunctiva/sclera: Conjunctivae normal.     Pupils: Pupils are equal, round, and reactive to light.  Cardiovascular:     Rate and Rhythm: Normal rate and regular rhythm.     Heart sounds: Normal heart sounds.  Pulmonary:     Effort: Pulmonary effort is normal.     Breath sounds: Normal breath sounds.  Abdominal:     General: Bowel sounds are normal.     Palpations: Abdomen is soft.  Musculoskeletal:        General: No tenderness.     Cervical back: Normal range of motion and neck supple.  Skin:    General: Skin is warm and dry.  Neurological:     Mental Status: He is alert and oriented to person, place, and time. Mental status is at baseline.  Psychiatric:        Mood and Affect: Mood normal.        Behavior: Behavior normal.     Labs reviewed: Basic Metabolic Panel: Recent Labs     10/06/19 0804 04/05/20 0933 06/14/20 2138 06/14/20 2200  NA 139 139 136 137  K 4.0 4.1 4.0 3.9  CL 102 105 100 103  CO2 30 25 23 28   GLUCOSE 79 89 328* 89  BUN 13 12 12 17   CREATININE 1.26* 1.29* 1.13 1.29*  CALCIUM 9.5 9.6 8.8* 9.4  TSH 13.11* 4.84*  --   --  Liver Function Tests: Recent Labs    04/05/20 0933 06/14/20 2138 06/14/20 2200  AST 14 24 19   ALT 7* 34 14  ALKPHOS  --  207* 56  BILITOT 0.5 1.2 0.7  PROT 7.0 6.4* 7.7  ALBUMIN  --  3.3* 3.9   No results for input(s): LIPASE, AMYLASE in the last 8760 hours. No results for input(s): AMMONIA in the last 8760 hours. CBC: Recent Labs    10/06/19 0804 04/05/20 0933 06/14/20 2229  WBC 3.9 3.3* 5.4  NEUTROABS 1,853 1,587 4.2  HGB 13.2 13.5 12.9*  HCT 40.5 41.1 40.4  MCV 84.2 83.5 84.9  PLT 224 208 220   Lipid Panel: Recent Labs    10/06/19 0804  CHOL 146  HDL 66  LDLCALC 68  TRIG 50  CHOLHDL 2.2   TSH: Recent Labs    10/06/19 0804 04/05/20 0933  TSH 13.11* 4.84*   A1C: Lab Results  Component Value Date   HGBA1C 6.3 (H) 04/05/2020     Assessment/Plan 1. COVID-19 -symptoms have resolved at this time. He is doing well.   2. Chronic idiopathic constipation -to increase fiber and water in diet. Also increase in physical activity.  -handout given.   Next appt: 09/30/2020 as schedule.  Carlos American. [place], [facility] [phone]

## 2020-06-23 NOTE — Patient Instructions (Signed)
  Increase fiber in diet or supplement (benefiber or metamucil)  Increase physical activity Increase water  Constipation, Adult Constipation is when a person has fewer than three bowel movements in a week, has difficulty having a bowel movement, or has stools (feces) that are dry, hard, or larger than normal. Constipation may be caused by an underlying condition. It may become worse with age if a person takes certain medicines and does not take in enough fluids. Follow these instructions at home: Eating and drinking  Eat foods that have a lot of fiber, such as beans, whole grains, and fresh fruits and vegetables.  Limit foods that are low in fiber and high in fat and processed sugars, such as fried or sweet foods. These include french fries, hamburgers, cookies, candies, and soda.  Drink enough fluid to keep your urine pale yellow.   General instructions  Exercise regularly or as told by your health care provider. Try to do 150 minutes of moderate exercise each week.  Use the bathroom when you have the urge to go. Do not hold it in.  Take over-the-counter and prescription medicines only as told by your health care provider. This includes any fiber supplements.  During bowel movements: ? Practice deep breathing while relaxing the lower abdomen. ? Practice pelvic floor relaxation.  Watch your condition for any changes. Let your health care provider know about them.  Keep all follow-up visits as told by your health care provider. This is important. Contact a health care provider if:  You have pain that gets worse.  You have a fever.  You do not have a bowel movement after 4 days.  You vomit.  You are not hungry or you lose weight.  You are bleeding from the opening between the buttocks (anus).  You have thin, pencil-like stools. Get help right away if:  You have a fever and your symptoms suddenly get worse.  You leak stool or have blood in your stool.  Your abdomen is  bloated.  You have severe pain in your abdomen.  You feel dizzy or you faint. Summary  Constipation is when a person has fewer than three bowel movements in a week, has difficulty having a bowel movement, or has stools (feces) that are dry, hard, or larger than normal.  Eat foods that have a lot of fiber, such as beans, whole grains, and fresh fruits and vegetables.  Drink enough fluid to keep your urine pale yellow.  Take over-the-counter and prescription medicines only as told by your health care provider. This includes any fiber supplements. This information is not intended to replace advice given to you by your health care provider. Make sure you discuss any questions you have with your health care provider. Document Revised: 11/20/2018 Document Reviewed: 11/20/2018 Elsevier Patient Education  [place].

## 2020-08-07 ENCOUNTER — Other Ambulatory Visit: Payer: Self-pay | Admitting: Nurse Practitioner

## 2020-08-07 DIAGNOSIS — E782 Mixed hyperlipidemia: Secondary | ICD-10-CM

## 2020-08-16 ENCOUNTER — Other Ambulatory Visit: Payer: Self-pay | Admitting: Nurse Practitioner

## 2020-08-16 DIAGNOSIS — N182 Chronic kidney disease, stage 2 (mild): Secondary | ICD-10-CM

## 2020-08-16 DIAGNOSIS — E1122 Type 2 diabetes mellitus with diabetic chronic kidney disease: Secondary | ICD-10-CM

## 2020-08-25 ENCOUNTER — Encounter: Payer: Self-pay | Admitting: Podiatry

## 2020-08-25 ENCOUNTER — Other Ambulatory Visit: Payer: Self-pay

## 2020-08-25 ENCOUNTER — Ambulatory Visit: Payer: Medicare PPO | Admitting: Podiatry

## 2020-08-25 DIAGNOSIS — E1122 Type 2 diabetes mellitus with diabetic chronic kidney disease: Secondary | ICD-10-CM | POA: Diagnosis not present

## 2020-08-25 DIAGNOSIS — M79675 Pain in left toe(s): Secondary | ICD-10-CM | POA: Diagnosis not present

## 2020-08-25 DIAGNOSIS — Z794 Long term (current) use of insulin: Secondary | ICD-10-CM

## 2020-08-25 DIAGNOSIS — L84 Corns and callosities: Secondary | ICD-10-CM | POA: Diagnosis not present

## 2020-08-25 DIAGNOSIS — M79674 Pain in right toe(s): Secondary | ICD-10-CM

## 2020-08-25 DIAGNOSIS — B351 Tinea unguium: Secondary | ICD-10-CM

## 2020-08-25 DIAGNOSIS — N182 Chronic kidney disease, stage 2 (mild): Secondary | ICD-10-CM | POA: Diagnosis not present

## 2020-08-28 ENCOUNTER — Other Ambulatory Visit: Payer: Self-pay | Admitting: Nurse Practitioner

## 2020-08-28 DIAGNOSIS — E1122 Type 2 diabetes mellitus with diabetic chronic kidney disease: Secondary | ICD-10-CM

## 2020-08-28 DIAGNOSIS — N182 Chronic kidney disease, stage 2 (mild): Secondary | ICD-10-CM

## 2020-08-29 NOTE — Progress Notes (Signed)
Subjective: Mitchell Gibbs is a pleasant 70 y.o. male patient seen today painful thick toenails that are difficult to trim. Pain interferes with ambulation. Aggravating factors include wearing enclosed shoe gear. Pain is relieved with periodic professional debridement. He states he is still using compounded topical antifungal solution on nails, but is not sure it's working.  He is seen for diabetic foot care on today's visit. Patient states his blood glucose was 105 mg/dl today.  Sadly, he lost his mother in May.   PCP is [name], NP. Last visit was: 06/23/2020.  No Known Allergies  Objective: Physical Exam  General: [name] is a pleasant 70 y.o. African American male, in NAD. AAO x 3.   Vascular:  Capillary refill time to digits immediate b/l. Palpable DP pulse(s) b/l lower extremities Palpable PT pulse(s) b/l lower extremities Pedal hair sparse. Lower extremity skin temperature gradient within normal limits. No pain with calf compression b/l. No edema noted b/l lower extremities.  Dermatological:  Pedal skin with normal turgor, texture and tone b/l lower extremities. Toenails 1-5 b/l elongated, discolored, dystrophic, thickened, crumbly with subungual debris and tenderness to dorsal palpation. Hyperkeratotic lesion(s) submet head 1 right foot, submet head 3 left foot, submet head 5 left foot, and submet head 5 right foot.  No erythema, no edema, no drainage, no fluctuance.  Musculoskeletal:  Normal muscle strength 5/5 to all lower extremity muscle groups bilaterally. No pain crepitus or joint limitation noted with ROM b/l lower extremities. Hallux valgus with bunion deformity noted b/l lower extremities. Hammertoe(s) noted to the 2-5 bilaterally.  Neurological:  Protective sensation intact 5/5 intact bilaterally with 10g monofilament b/l. Vibratory sensation intact b/l.  Assessment and Plan:  1. Pain due to onychomycosis of toenails of both feet   2. Callus   3.  Type 2 diabetes mellitus with stage 2 chronic kidney disease, with long-term current use of insulin ([place])   -Examined patient. -Continue diabetic foot care principles: inspect feet daily, monitor glucose as recommended by PCP and/or Endocrinologist, and follow prescribed diet per PCP, Endocrinologist and/or dietician. -Patient to continue soft, supportive shoe gear daily. -Toenails 1-5 b/l were debrided in length and girth with sterile nail nippers and dremel without iatrogenic bleeding. Patient is to complete current bottle of compounded topical antifungal solution. We discussed laser therapy and will reassess at his next visit. -Callus(es) submet head 1 right foot, submet head 3 left foot, submet head 5 left foot, and submet head 5 right foot pared utilizing sterile scalpel blade without complication or incident. Total number debrided =4. -Patient to report any pedal injuries to medical professional immediately. -Patient/POA to call should there be question/concern in the interim.  Return in about 3 months (around 11/25/2020).  [name], DPM

## 2020-08-30 ENCOUNTER — Other Ambulatory Visit: Payer: Self-pay | Admitting: *Deleted

## 2020-08-30 DIAGNOSIS — E1122 Type 2 diabetes mellitus with diabetic chronic kidney disease: Secondary | ICD-10-CM

## 2020-08-30 DIAGNOSIS — N182 Chronic kidney disease, stage 2 (mild): Secondary | ICD-10-CM

## 2020-08-30 MED ORDER — METFORMIN HCL 500 MG PO TABS: ORAL_TABLET | ORAL | 1 refills | Status: DC

## 2020-08-30 NOTE — Telephone Encounter (Signed)
Patient called and stated that he needs refill. Rx had a "transmission fail" on 8/1.  Resent Rx. (Receipt of confirmation received)

## 2020-09-30 ENCOUNTER — Other Ambulatory Visit: Payer: Medicare PPO

## 2020-09-30 ENCOUNTER — Other Ambulatory Visit: Payer: Self-pay

## 2020-09-30 DIAGNOSIS — N182 Chronic kidney disease, stage 2 (mild): Secondary | ICD-10-CM

## 2020-09-30 DIAGNOSIS — I10 Essential (primary) hypertension: Secondary | ICD-10-CM

## 2020-09-30 DIAGNOSIS — E1122 Type 2 diabetes mellitus with diabetic chronic kidney disease: Secondary | ICD-10-CM

## 2020-09-30 DIAGNOSIS — E890 Postprocedural hypothyroidism: Secondary | ICD-10-CM

## 2020-09-30 DIAGNOSIS — E782 Mixed hyperlipidemia: Secondary | ICD-10-CM

## 2020-10-06 ENCOUNTER — Encounter: Payer: Self-pay | Admitting: Nurse Practitioner

## 2020-10-06 ENCOUNTER — Ambulatory Visit: Payer: Medicare PPO | Admitting: Nurse Practitioner

## 2020-10-06 ENCOUNTER — Other Ambulatory Visit: Payer: Self-pay

## 2020-10-06 VITALS — BP 122/80 | HR 60 | Temp 96.9°F | Ht 72.0 in | Wt 217.0 lb

## 2020-10-06 DIAGNOSIS — E1122 Type 2 diabetes mellitus with diabetic chronic kidney disease: Secondary | ICD-10-CM | POA: Diagnosis not present

## 2020-10-06 DIAGNOSIS — N182 Chronic kidney disease, stage 2 (mild): Secondary | ICD-10-CM | POA: Diagnosis not present

## 2020-10-06 DIAGNOSIS — R3912 Poor urinary stream: Secondary | ICD-10-CM

## 2020-10-06 DIAGNOSIS — K5904 Chronic idiopathic constipation: Secondary | ICD-10-CM

## 2020-10-06 DIAGNOSIS — E782 Mixed hyperlipidemia: Secondary | ICD-10-CM | POA: Diagnosis not present

## 2020-10-06 DIAGNOSIS — Z23 Encounter for immunization: Secondary | ICD-10-CM

## 2020-10-06 DIAGNOSIS — E890 Postprocedural hypothyroidism: Secondary | ICD-10-CM | POA: Diagnosis not present

## 2020-10-06 DIAGNOSIS — I10 Essential (primary) hypertension: Secondary | ICD-10-CM | POA: Diagnosis not present

## 2020-10-06 DIAGNOSIS — Z794 Long term (current) use of insulin: Secondary | ICD-10-CM | POA: Diagnosis not present

## 2020-10-06 LAB — PSA: PSA: 0.4 ng/mL (ref <=4.00)

## 2020-10-06 LAB — LIPID PANEL
Cholesterol: 153 mg/dL (ref <200)
HDL: 74 mg/dL (ref >=40)
LDL Cholesterol (Calc): 66 mg/dL (calc)
Non-HDL Cholesterol (Calc): 79 mg/dL (calc) (ref <130)
Total CHOL/HDL Ratio: 2.1 (calc) (ref <5.0)
Triglycerides: 44 mg/dL (ref <150)

## 2020-10-06 LAB — CBC WITH DIFFERENTIAL/PLATELET
Absolute Monocytes: 349 cells/uL (ref 200–950)
Basophils Absolute: 38 cells/uL (ref 0–200)
Basophils Relative: 0.9 %
Eosinophils Absolute: 80 cells/uL (ref 15–500)
Eosinophils Relative: 1.9 %
HCT: 39.3 % (ref 38.5–50.0)
Hemoglobin: 12.8 g/dL — ABNORMAL LOW (ref 13.2–17.1)
Lymphs Abs: 1562 cells/uL (ref 850–3900)
MCH: 27.5 pg (ref 27.0–33.0)
MCHC: 32.6 g/dL (ref 32.0–36.0)
MCV: 84.3 fL (ref 80.0–100.0)
MPV: 10 fL (ref 7.5–12.5)
Monocytes Relative: 8.3 %
Neutro Abs: 2171 cells/uL (ref 1500–7800)
Neutrophils Relative %: 51.7 %
Platelets: 231 10*3/uL (ref 140–400)
RBC: 4.66 10*6/uL (ref 4.20–5.80)
RDW: 13.8 % (ref 11.0–15.0)
Total Lymphocyte: 37.2 %
WBC: 4.2 10*3/uL (ref 3.8–10.8)

## 2020-10-06 LAB — COMPLETE METABOLIC PANEL WITH GFR
AG Ratio: 1.5 (calc) (ref 1.0–2.5)
ALT: 11 U/L (ref 9–46)
AST: 20 U/L (ref 10–35)
Albumin: 4.1 g/dL (ref 3.6–5.1)
Alkaline phosphatase (APISO): 56 U/L (ref 35–144)
BUN: 17 mg/dL (ref 7–25)
CO2: 27 mmol/L (ref 20–32)
Calcium: 9.3 mg/dL (ref 8.6–10.3)
Chloride: 105 mmol/L (ref 98–110)
Creat: 1.24 mg/dL (ref 0.70–1.35)
Globulin: 2.7 g/dL (calc) (ref 1.9–3.7)
Glucose, Bld: 60 mg/dL — ABNORMAL LOW (ref 65–99)
Potassium: 3.5 mmol/L (ref 3.5–5.3)
Sodium: 141 mmol/L (ref 135–146)
Total Bilirubin: 0.5 mg/dL (ref 0.2–1.2)
Total Protein: 6.8 g/dL (ref 6.1–8.1)
eGFR: 63 mL/min/{1.73_m2} (ref >=60)

## 2020-10-06 LAB — HEMOGLOBIN A1C
Hgb A1c MFr Bld: 6.1 % of total Hgb — ABNORMAL HIGH (ref <5.7)
Mean Plasma Glucose: 128 mg/dL
eAG (mmol/L): 7.1 mmol/L

## 2020-10-06 LAB — TSH: TSH: 5.64 mIU/L — ABNORMAL HIGH (ref 0.40–4.50)

## 2020-10-06 LAB — TEST AUTHORIZATION

## 2020-10-06 NOTE — Progress Notes (Signed)
Careteam: Patient Care Team: [name], NP as PCP - General (Geriatric Medicine) Leighton Ruff, [place] (Optometry)  PLACE OF SERVICE:  [place] Directive information Does Patient Have a Medical Advance Directive?: No, Would patient like information on creating a medical advance directive?: No - Patient declined  No Known Allergies  Chief Complaint  Patient presents with   Medical Management of Chronic Issues    6 month follow-up and discuss labs (copy printed). Discuss need for shingrix and covid #4. Flu vaccine today      HPI: Patient is a 70 y.o. male for routine follow up  DM- continues on metformin 500 mg twice daily with levemir 44 units daily, A1c at goal on last labs. Blood sugar was 60 on morning of labs denies hypoglycemia.   Hypothryoid- TSH mildly elevated, trending up. On 112 mcg daily of synthroid.   Htn- controlled on losartan 100 mg daily. Also on ASA   Hyperlipdiemia- LDL at goal on lovastatin   Has incorporated more weight training.   Reports wife is having episodes of not remembering.  Sometimes will have visual hallucinations. At night she will becomes more confused. Did not get her head CT which was ordered in April.    Review of Systems:  Review of Systems  Constitutional:  Negative for chills, fever and weight loss.  HENT:  Negative for tinnitus.   Respiratory:  Negative for cough, sputum production and shortness of breath.   Cardiovascular:  Negative for chest pain, palpitations and leg swelling.  Gastrointestinal:  Negative for abdominal pain, constipation, diarrhea and heartburn.  Genitourinary:  Negative for dysuria, frequency and urgency.       Occasional weak streat  Musculoskeletal:  Negative for back pain, falls, joint pain and myalgias.  Skin: Negative.   Neurological:  Negative for dizziness and headaches.  Psychiatric/Behavioral:  Negative for depression and memory loss. The patient does not have insomnia.     Past Medical History:  Diagnosis Date   Bell's palsy    2005   Carpal tunnel syndrome    Cataract    CKD stage 2 due to type 2 diabetes mellitus ([place]) 12/01/2015   Diabetes mellitus without complication (HCC)    Edema    Gout, unspecified    Hyperlipidemia    Hypertension    Internal hemorrhoids without mention of complication    Malignant neoplasm of thyroid gland (HCC)    Memory loss    Mixed disorders as reaction to stress    Obesity, unspecified    Other malaise and fatigue    Other psoriasis    Sleep apnea    no CPAP used 09-23-15   Supraventricular premature beats    Thyroid disease    Trigger finger (acquired)    Unspecified sleep apnea    Past Surgical History:  Procedure Laterality Date   CATARACT EXTRACTION W/ INTRAOCULAR LENS IMPLANT Right 06/05/2016   Dr. Arlina Robes   COLONOSCOPY  10/07/2015   one polyp, Dr. Loletha Carrow   TOTAL THYROIDECTOMY  2007   Dr. Georgina Quint   Social History:   reports that he quit smoking about 41 years ago. His smoking use included cigarettes. He has never used smokeless tobacco. He reports that he does not currently use alcohol. He reports that he does not use drugs.  Family History  Problem Relation Age of Onset   Cancer - Prostate Father    Stomach cancer Father    Hypertension Other    Colon cancer  Neg Hx    Esophageal cancer Neg Hx    Rectal cancer Neg Hx     Medications: Patient's Medications  New Prescriptions   No medications on file  Previous Medications   ASPIRIN EC 81 MG TABLET    Take 81 mg by mouth daily.   B COMPLEX VITAMINS CAPSULE    Take 1 capsule by mouth daily.   INSULIN DETEMIR (LEVEMIR FLEXTOUCH) 100 UNIT/ML FLEXPEN    Inject 44 Units into the skin at bedtime. DX E11.22 , N18.2 , Z79.4   INSULIN PEN NEEDLE (B-D ULTRAFINE III SHORT PEN) 31G X 8 MM MISC    Use for insulin injections. Dx: E11.22   LEVOTHYROXINE (SYNTHROID) 112 MCG TABLET    TAKE 1 TABLET BY MOUTH DAILY BEFORE BREAKFAST.   LOSARTAN (COZAAR) 100  MG TABLET    TAKE 1 TABLET BY MOUTH EVERY DAY   LOVASTATIN (MEVACOR) 40 MG TABLET    TAKE 1 TABLET DAILY TO CONTROL CHOLESTEROL   METFORMIN (GLUCOPHAGE) 500 MG TABLET    Take one tablet by mouth before breakfast and One tablet before supper to control blood sugar.   NONFORMULARY OR COMPOUNDED ITEM    Antifungal solution: Terbinafine 3%, Fluconazole 2%, Tea Tree Oil 5%, Urea 10%, Ibuprofen 2% in DMSO suspension #80m   SILDENAFIL (VIAGRA) 100 MG TABLET    TAKE 1 TABLET BY MOUTH DAILY AS NEEDED FOR ERECTILE DYSFUNCTION   VITAMIN C (ASCORBIC ACID) 250 MG TABLET    Take 500 mg by mouth daily.  Modified Medications   No medications on file  Discontinued Medications   CYANOCOBALAMIN (B-12) 1000 MCG TABS    Take by mouth daily.    Physical Exam:  Vitals:   10/06/20 0907  BP: 122/80  Pulse: 60  Temp: (!) 96.9 F (36.1 C)  TempSrc: Temporal  SpO2: 99%  Weight: 217 lb (98.4 kg)  Height: 6' (1.829 m)   Body mass index is 29.43 kg/m. Wt Readings from Last 3 Encounters:  10/06/20 217 lb (98.4 kg)  06/23/20 204 lb (92.5 kg)  04/07/20 221 lb (100.2 kg)    Physical Exam Constitutional:      General: He is not in acute distress.    Appearance: He is well-developed. He is not diaphoretic.  HENT:     Head: Normocephalic and atraumatic.     Right Ear: External ear normal.     Left Ear: External ear normal.     Mouth/Throat:     Pharynx: No oropharyngeal exudate.  Eyes:     Conjunctiva/sclera: Conjunctivae normal.     Pupils: Pupils are equal, round, and reactive to light.  Cardiovascular:     Rate and Rhythm: Normal rate and regular rhythm.     Heart sounds: Normal heart sounds.  Pulmonary:     Effort: Pulmonary effort is normal.     Breath sounds: Normal breath sounds.  Abdominal:     General: Bowel sounds are normal.     Palpations: Abdomen is soft.  Musculoskeletal:        General: No tenderness.     Cervical back: Normal range of motion and neck supple.     Right lower leg:  No edema.     Left lower leg: No edema.  Skin:    General: Skin is warm and dry.  Neurological:     Mental Status: He is alert and oriented to person, place, and time.    Labs reviewed: Basic Metabolic Panel: Recent Labs  04/05/20 0933 06/14/20 2138 06/14/20 2200 09/30/20 0836  NA 139 136 137 141  K 4.1 4.0 3.9 3.5  CL 105 100 103 105  CO2 _0 GLUCOSE 89 328* 89 60*  BUN _1 CREATININE 1.29* 1.13 1.29* 1.24  CALCIUM 9.6 8.8* 9.4 9.3  TSH 4.84*  --   --  5.64*   Liver Function Tests: Recent Labs    06/14/20 2138 06/14/20 2200 09/30/20 0836  AST _2 ALT 34 14 11  ALKPHOS 207* 56  --   BILITOT 1.2 0.7 0.5  PROT 6.4* 7.7 6.8  ALBUMIN 3.3* 3.9  --    No results for input(s): LIPASE, AMYLASE in the last 8760 hours. No results for input(s): AMMONIA in the last 8760 hours. CBC: Recent Labs    04/05/20 0933 06/14/20 2229 09/30/20 0836  WBC 3.3* 5.4 4.2  NEUTROABS 1,587 4.2 2,171  HGB 13.5 12.9* 12.8*  HCT 41.1 40.4 39.3  MCV 83.5 84.9 84.3  PLT 208 220 231   Lipid Panel: Recent Labs    09/30/20 0836  CHOL 153  HDL 74  LDLCALC 66  TRIG 44  CHOLHDL 2.1   TSH: Recent Labs    04/05/20 0933 09/30/20 0836  TSH 4.84* 5.64*   A1C: Lab Results  Component Value Date   HGBA1C 6.1 (H) 09/30/2020     Assessment/Plan 1. Type 2 diabetes mellitus with stage 2 chronic kidney disease, with long-term current use of insulin ([place]) -Encouraged dietary compliance, routine foot care/monitoring and to keep up with diabetic eye exams through ophthalmology  - Flu Vaccine QUAD High Dose([facility]) - Hemoglobin A1c; Future  2. Postoperative hypothyroidism Tsh mildly elevated, forgetting medication occasionally.  - TSH; Future  3. Mixed hyperlipidemia Continues on lovastatin with dietary modification - CMP with eGFR([facility]); Future  4. Essential hypertension Controlled on current regimen, continue medication with dietary modifications.  -  CMP with eGFR([facility]); Future - CBC with Differential/Platelet; Future  5. Need for influenza vaccination - Flu Vaccine QUAD High Dose(Fluad)  6. Chronic idiopathic constipation -to start miralax 17 gm daily as needed for constipation. Increase hydration.   7. Weak urinary stream Will see if lab can add on PSA  Next appt: 6 months, labs prior  Naheim Burgen K. [place], [facility] [phone]

## 2020-10-06 NOTE — Patient Instructions (Addendum)
Please call back to schedule AWV   Recommended to get shingles vaccine and COVID booster at local pharmacy   Can use miralax 17 gm daily in 8 oz fluid for constipation Increase water intake.

## 2020-10-13 ENCOUNTER — Other Ambulatory Visit: Payer: Self-pay

## 2020-10-13 ENCOUNTER — Emergency Department (HOSPITAL_COMMUNITY)
Admission: EM | Admit: 2020-10-13 | Discharge: 2020-10-13 | Disposition: A | Discharge status: Home / Self Care | Payer: Medicare PPO | Attending: Emergency Medicine | Admitting: Emergency Medicine

## 2020-10-13 ENCOUNTER — Encounter (HOSPITAL_COMMUNITY): Payer: Self-pay

## 2020-10-13 DIAGNOSIS — K625 Hemorrhage of anus and rectum: Secondary | ICD-10-CM | POA: Insufficient documentation

## 2020-10-13 DIAGNOSIS — Z85850 Personal history of malignant neoplasm of thyroid: Secondary | ICD-10-CM | POA: Insufficient documentation

## 2020-10-13 DIAGNOSIS — Z79899 Other long term (current) drug therapy: Secondary | ICD-10-CM | POA: Diagnosis not present

## 2020-10-13 DIAGNOSIS — D72819 Decreased white blood cell count, unspecified: Secondary | ICD-10-CM | POA: Insufficient documentation

## 2020-10-13 DIAGNOSIS — Z87891 Personal history of nicotine dependence: Secondary | ICD-10-CM | POA: Insufficient documentation

## 2020-10-13 DIAGNOSIS — N182 Chronic kidney disease, stage 2 (mild): Secondary | ICD-10-CM | POA: Diagnosis not present

## 2020-10-13 DIAGNOSIS — K644 Residual hemorrhoidal skin tags: Secondary | ICD-10-CM | POA: Diagnosis not present

## 2020-10-13 DIAGNOSIS — I129 Hypertensive chronic kidney disease with stage 1 through stage 4 chronic kidney disease, or unspecified chronic kidney disease: Secondary | ICD-10-CM | POA: Diagnosis not present

## 2020-10-13 DIAGNOSIS — Z794 Long term (current) use of insulin: Secondary | ICD-10-CM | POA: Diagnosis not present

## 2020-10-13 DIAGNOSIS — K5900 Constipation, unspecified: Secondary | ICD-10-CM | POA: Diagnosis not present

## 2020-10-13 DIAGNOSIS — Z7984 Long term (current) use of oral hypoglycemic drugs: Secondary | ICD-10-CM | POA: Diagnosis not present

## 2020-10-13 DIAGNOSIS — E1122 Type 2 diabetes mellitus with diabetic chronic kidney disease: Secondary | ICD-10-CM | POA: Diagnosis not present

## 2020-10-13 DIAGNOSIS — Z7982 Long term (current) use of aspirin: Secondary | ICD-10-CM | POA: Insufficient documentation

## 2020-10-13 LAB — COMPREHENSIVE METABOLIC PANEL
ALT: 15 U/L (ref 0–44)
AST: 20 U/L (ref 15–41)
Albumin: 3.9 g/dL (ref 3.5–5.0)
Alkaline Phosphatase: 55 U/L (ref 38–126)
Anion gap: 8 (ref 5–15)
BUN: 15 mg/dL (ref 8–23)
CO2: 26 mmol/L (ref 22–32)
Calcium: 9.4 mg/dL (ref 8.9–10.3)
Chloride: 105 mmol/L (ref 98–111)
Creatinine, Ser: 1.17 mg/dL (ref 0.61–1.24)
GFR, Estimated: >60 mL/min (ref >60)
Glucose, Bld: 88 mg/dL (ref 70–99)
Potassium: 4.1 mmol/L (ref 3.5–5.1)
Sodium: 139 mmol/L (ref 135–145)
Total Bilirubin: 0.8 mg/dL (ref 0.3–1.2)
Total Protein: 7.4 g/dL (ref 6.5–8.1)

## 2020-10-13 LAB — CBC WITH DIFFERENTIAL/PLATELET
Abs Immature Granulocytes: 0 10*3/uL (ref 0.00–0.07)
Basophils Absolute: 0 10*3/uL (ref 0.0–0.1)
Basophils Relative: 1 %
Eosinophils Absolute: 0.1 10*3/uL (ref 0.0–0.5)
Eosinophils Relative: 2 %
HCT: 42.9 % (ref 39.0–52.0)
Hemoglobin: 13.7 g/dL (ref 13.0–17.0)
Immature Granulocytes: 0 %
Lymphocytes Relative: 41 %
Lymphs Abs: 1.5 10*3/uL (ref 0.7–4.0)
MCH: 27.6 pg (ref 26.0–34.0)
MCHC: 31.9 g/dL (ref 30.0–36.0)
MCV: 86.3 fL (ref 80.0–100.0)
Monocytes Absolute: 0.4 10*3/uL (ref 0.1–1.0)
Monocytes Relative: 10 %
Neutro Abs: 1.7 10*3/uL (ref 1.7–7.7)
Neutrophils Relative %: 46 %
Platelets: 239 10*3/uL (ref 150–400)
RBC: 4.97 MIL/uL (ref 4.22–5.81)
RDW: 14.7 % (ref 11.5–15.5)
WBC: 3.6 10*3/uL — ABNORMAL LOW (ref 4.0–10.5)
nRBC: 0 % (ref 0.0–0.2)

## 2020-10-13 LAB — POC OCCULT BLOOD, ED: Fecal Occult Bld: POSITIVE — AB

## 2020-10-13 MED ORDER — POLYETHYLENE GLYCOL 3350 17 G PO PACK: 17.0000 g | PACK | Freq: Every day | ORAL | 0 refills | Status: DC

## 2020-10-13 NOTE — ED Triage Notes (Signed)
Pt reports hx of hemorrhoids and states that while trying to have a BM today he feels like he tore something in his rectum and is now bleeding.

## 2020-10-13 NOTE — ED Provider Notes (Signed)
[facility] DEPT Provider Note   CSN: [id number] Arrival date & time: 10/13/20  1923     History Chief Complaint  Patient presents with   Hemorrhoids    Mitchell Gibbs is a 70 y.o. male.  HPI  70 year old male with a history of Bell's palsy, carpal tunnel syndrome, cataracts, CKD, diabetes, edema, gout, hyperlipidemia, hypertension, internal hemorrhoids, malignant neoplasm of the thyroid gland, obesity, psoriasis, sleep apnea, SVT, who presents to the emergency department today for evaluation of rectal bleeding.  Patient states that he was constipated today and was straining to have a bowel movement.  He states that he pushed so hard he noted blood in the toilet.  He states he is afraid he is torn something.  He does have a history of hemorrhoids and has had prior hemorrhoid surgery.  He denies any abdominal pain, nausea, vomiting or other associated systemic symptoms.  He just had the 1 episode of bleeding that occurred just prior to arrival.  He is not anticoagulated. He does not have any rectal pain.  Past Medical History:  Diagnosis Date   Bell's palsy    2005   Carpal tunnel syndrome    Cataract    CKD stage 2 due to type 2 diabetes mellitus ([place]) 12/01/2015   Diabetes mellitus without complication (HCC)    Edema    Gout, unspecified    Hyperlipidemia    Hypertension    Internal hemorrhoids without mention of complication    Malignant neoplasm of thyroid gland (HCC)    Memory loss    Mixed disorders as reaction to stress    Obesity, unspecified    Other malaise and fatigue    Other psoriasis    Sleep apnea    no CPAP used 09-23-15   Supraventricular premature beats    Thyroid disease    Trigger finger (acquired)    Unspecified sleep apnea     Patient Active Problem List   Diagnosis Date Noted   Postoperative hypothyroidism 05/16/2017   CKD stage 2 due to type 2 diabetes mellitus ([place]) 12/01/2015   BPPV (benign paroxysmal positional  vertigo) 10/06/2014   Tinea pedis of left foot 02/09/2014   Erectile dysfunction 03/12/2013   DM (diabetes mellitus), type 2 with renal complications ([place]) 20/94/7096   Hypertension    Unspecified sleep apnea    Hyperlipidemia     Past Surgical History:  Procedure Laterality Date   CATARACT EXTRACTION W/ INTRAOCULAR LENS IMPLANT Right 06/05/2016   Dr. Arlina Robes   COLONOSCOPY  10/07/2015   one polyp, Dr. Loletha Carrow   TOTAL THYROIDECTOMY  2007   Dr. Georgina Quint       Family History  Problem Relation Age of Onset   Cancer - Prostate Father    Stomach cancer Father    Hypertension Other    Colon cancer Neg Hx    Esophageal cancer Neg Hx    Rectal cancer Neg Hx     Social History   Tobacco Use   Smoking status: Former    Types: Cigarettes    Quit date: 10/27/1978    Years since quitting: 41.9   Smokeless tobacco: Never  Vaping Use   Vaping Use: Never used  Substance Use Topics   Alcohol use: Not Currently    Comment: rarely   Drug use: No    Home Medications Prior to Admission medications   Medication Sig Start Date End Date Taking? Authorizing Provider  polyethylene glycol (MIRALAX) 17 g packet Take  17 g by mouth daily. Dissolve one cap full in solution (water, gatorade, etc.) and administer once cap-full daily. You may titrate up daily by 1 cap-full until the patient is having pudding consistency of stools. After the patient is able to start passing softer stools they will need to be on 1/2 cap-full daily for 2 weeks. 10/13/20  Yes Aidan Moten S, PA-C  aspirin EC 81 MG tablet Take 81 mg by mouth daily.    [provider]  b complex vitamins capsule Take 1 capsule by mouth daily.    [provider]  insulin detemir (LEVEMIR FLEXTOUCH) 100 UNIT/ML FlexPen Inject 44 Units into the skin at bedtime. DX E11.22 , N18.2 , Z79.4 01/01/20   [name], NP  Insulin Pen Needle (B-D ULTRAFINE III SHORT PEN) 31G X 8 MM MISC Use for insulin injections. Dx:  E11.22 06/06/18   [name], NP  levothyroxine (SYNTHROID) 112 MCG tablet TAKE 1 TABLET BY MOUTH DAILY BEFORE BREAKFAST. 08/09/20   [name], NP  losartan (COZAAR) 100 MG tablet TAKE 1 TABLET BY MOUTH EVERY DAY 05/20/20   [name], NP  lovastatin (MEVACOR) 40 MG tablet TAKE 1 TABLET DAILY TO CONTROL CHOLESTEROL 08/09/20   [name], NP  metFORMIN (GLUCOPHAGE) 500 MG tablet Take one tablet by mouth before breakfast and One tablet before supper to control blood sugar. 08/30/20   [name], NP  NONFORMULARY OR COMPOUNDED ITEM Antifungal solution: Terbinafine 3%, Fluconazole 2%, Tea Tree Oil 5%, Urea 10%, Ibuprofen 2% in DMSO suspension #28mL 10/25/18   [name], Stephani Police, DPM  sildenafil (VIAGRA) 100 MG tablet TAKE 1 TABLET BY MOUTH DAILY AS NEEDED FOR ERECTILE DYSFUNCTION 03/09/20   [name], NP  vitamin C (ASCORBIC ACID) 250 MG tablet Take 500 mg by mouth daily.    [provider]    Allergies    Patient has no known allergies.  Review of Systems   Review of Systems  Constitutional:  Negative for fever.  Respiratory:  Negative for shortness of breath.   Cardiovascular:  Negative for chest pain.  Gastrointestinal:  Positive for anal bleeding and constipation. Negative for abdominal pain, diarrhea, nausea and vomiting.  Musculoskeletal:  Negative for back pain.  Neurological:  Negative for syncope and light-headedness.   Physical Exam Updated Vital Signs BP (!) 160/107 (BP Location: Left Arm)   Pulse 60   Temp 98.4 F (36.9 C) (Oral)   Resp 17   Ht 6' (1.829 m)   Wt 95.7 kg   SpO2 100%   BMI 28.62 kg/m   Physical Exam Constitutional:      General: He is not in acute distress.    Appearance: He is well-developed.  Eyes:     Conjunctiva/sclera: Conjunctivae normal.  Cardiovascular:     Rate and Rhythm: Normal rate and regular rhythm.  Pulmonary:     Effort: Pulmonary effort is normal.     Breath sounds: Normal breath  sounds.  Abdominal:     General: Bowel sounds are normal. There is no distension.     Palpations: Abdomen is soft.     Tenderness: There is no abdominal tenderness. There is no guarding or rebound.  Genitourinary:    Comments: Chaperone present. Multiple large external hemorrhoids present. Bright red blood noted dried on the buttocks. DRE performed, no specific internal hemorrhoid palpated. There was some blood mixed with light brown stool noted. Skin:    General: Skin is warm and  dry.  Neurological:     Mental Status: He is alert and oriented to person, place, and time.    ED Results / Procedures / Treatments   Labs (all labs ordered are listed, but only abnormal results are displayed) Labs Reviewed  CBC WITH DIFFERENTIAL/PLATELET - Abnormal; Notable for the following components:      Result Value   WBC 3.6 (*)    All other components within normal limits  POC OCCULT BLOOD, ED - Abnormal; Notable for the following components:   Fecal Occult Bld POSITIVE (*)    All other components within normal limits  COMPREHENSIVE METABOLIC PANEL    EKG None  Radiology No results found.  Procedures Procedures   Medications Ordered in ED Medications - No data to display  ED Course  I have reviewed the triage vital signs and the nursing notes.  Pertinent labs & imaging results that were available during my care of the patient were reviewed by me and considered in my medical decision making (see chart for details).    MDM Rules/Calculators/A&P                          70 year old male presents to the emergency department today for evaluation of rectal bleeding.  He had an episode of bright red blood per rectum that occurred in the setting of straining to push out a hard stool.  On exam he had multiple large external hemorrhoids with possible partial rectal prolapse.  The hemorrhoids were none tender to palpation, DRE was performed and there was light brown stool mixed with red blood  present.  No impaction noted.  Abdomen soft and nontender.  CBC with mild leukopenia, normal hemoglobin and actually improved from last week.  BMP is benign.  The patient was monitored in the ED for several hours and on reassessment he has had no further episodes of symptoms and remains hemodynamically stable.  We discussed the option of continued monitoring and repeat hemoglobin versus discharge home with close monitoring of symptoms with the agreement to return if any symptoms recurred.  He ultimately felt more comfortable with the plan for discharge and I think that this is completely reasonable as I suspect that his symptoms are likely related to hemorrhoids rather than complicated GI bleed.  Will refer to GI.  Will prescribe MiraLAX for home.  Advised on return precautions and patient voiced understanding.  All questions answered.  Final Clinical Impression(s) / ED Diagnoses Final diagnoses:  External hemorrhoids  Bright red blood per rectum    Rx / DC Orders ED Discharge Orders          Ordered    polyethylene glycol (MIRALAX) 17 g packet  Daily        10/13/20 2129             Bishop [place] 10/13/20 2129    Davonna Belling, MD 10/13/20 2330

## 2020-10-13 NOTE — Discharge Instructions (Signed)
Take miralax as directed   Please follow up with [facility]   Please return to the emergency department for any new or worsening symptoms.

## 2020-11-03 ENCOUNTER — Other Ambulatory Visit: Payer: Self-pay | Admitting: Nurse Practitioner

## 2020-11-19 ENCOUNTER — Other Ambulatory Visit: Payer: Self-pay | Admitting: Nurse Practitioner

## 2020-11-19 DIAGNOSIS — I10 Essential (primary) hypertension: Secondary | ICD-10-CM

## 2020-12-01 ENCOUNTER — Other Ambulatory Visit: Payer: Self-pay

## 2020-12-01 ENCOUNTER — Ambulatory Visit: Payer: Medicare PPO | Admitting: Podiatry

## 2020-12-01 ENCOUNTER — Encounter: Payer: Self-pay | Admitting: Podiatry

## 2020-12-01 DIAGNOSIS — E1122 Type 2 diabetes mellitus with diabetic chronic kidney disease: Secondary | ICD-10-CM | POA: Diagnosis not present

## 2020-12-01 DIAGNOSIS — Z794 Long term (current) use of insulin: Secondary | ICD-10-CM

## 2020-12-01 DIAGNOSIS — M2011 Hallux valgus (acquired), right foot: Secondary | ICD-10-CM

## 2020-12-01 DIAGNOSIS — L84 Corns and callosities: Secondary | ICD-10-CM

## 2020-12-01 DIAGNOSIS — M79674 Pain in right toe(s): Secondary | ICD-10-CM

## 2020-12-01 DIAGNOSIS — M2041 Other hammer toe(s) (acquired), right foot: Secondary | ICD-10-CM

## 2020-12-01 DIAGNOSIS — B351 Tinea unguium: Secondary | ICD-10-CM | POA: Diagnosis not present

## 2020-12-01 DIAGNOSIS — M2012 Hallux valgus (acquired), left foot: Secondary | ICD-10-CM

## 2020-12-01 DIAGNOSIS — E119 Type 2 diabetes mellitus without complications: Secondary | ICD-10-CM

## 2020-12-01 DIAGNOSIS — N182 Chronic kidney disease, stage 2 (mild): Secondary | ICD-10-CM

## 2020-12-01 DIAGNOSIS — M2042 Other hammer toe(s) (acquired), left foot: Secondary | ICD-10-CM

## 2020-12-01 DIAGNOSIS — M79675 Pain in left toe(s): Secondary | ICD-10-CM

## 2020-12-01 NOTE — Progress Notes (Signed)
Mitchell Gibbs presents today for for Mitchell diabetic foot examination.  Patient relates 9 year h/o diabetes.  Patient denies any h/o foot wounds.  Patient has been diagnosed with neuropathy and it is managed with gabapentin.  He denies any numbness, tingling, burning or pins/needle sensations in his feet.  Patient states blood glucose was 105 mg/dl today.  [name], NP is patient's PCP. Last visit was 10/06/2020.  Past Medical History:  Diagnosis Date   Bell's palsy    2005   Carpal tunnel syndrome    Cataract    CKD stage 2 due to type 2 diabetes mellitus ([place]) 12/01/2015   Diabetes mellitus without complication (HCC)    Edema    Gout, unspecified    Hyperlipidemia    Hypertension    Internal hemorrhoids without mention of complication    Malignant neoplasm of thyroid gland (HCC)    Memory loss    Mixed disorders as reaction to stress    Obesity, unspecified    Other malaise and fatigue    Other psoriasis    Sleep apnea    no CPAP used 09-23-15   Supraventricular premature beats    Thyroid disease    Trigger finger (acquired)    Unspecified sleep apnea    Patient Active Problem List   Diagnosis Date Noted   Postoperative hypothyroidism 05/16/2017   CKD stage 2 due to type 2 diabetes mellitus ([place]) 12/01/2015   BPPV (benign paroxysmal positional vertigo) 10/06/2014   Tinea pedis of left foot 02/09/2014   Erectile dysfunction 03/12/2013   DM (diabetes mellitus), type 2 with renal complications ([place]) 16/10/9602   Hypertension    Unspecified sleep apnea    Hyperlipidemia    Past Surgical History:  Procedure Laterality Date   CATARACT EXTRACTION W/ INTRAOCULAR LENS IMPLANT Right 06/05/2016   Dr. Arlina Robes   COLONOSCOPY  10/07/2015   one polyp, Dr. Loletha Carrow   TOTAL THYROIDECTOMY  2007   Dr. Georgina Quint   Current Outpatient Medications on File Prior to Visit  Medication Sig Dispense Refill   sildenafil (VIAGRA)  100 MG tablet TAKE ONE TABLET BY MOUTH DAILY AS NEEDED FOR ERECTILE DYSFUNCTION 22 tablet 5   aspirin EC 81 MG tablet Take 81 mg by mouth daily.     b complex vitamins capsule Take 1 capsule by mouth daily.     insulin detemir (LEVEMIR FLEXTOUCH) 100 UNIT/ML FlexPen Inject 44 Units into the skin at bedtime. DX E11.22 , N18.2 , Z79.4 15 mL 11   Insulin Pen Needle (B-D ULTRAFINE III SHORT PEN) 31G X 8 MM MISC Use for insulin injections. Dx: E11.22 300 each 3   levothyroxine (SYNTHROID) 112 MCG tablet TAKE 1 TABLET BY MOUTH DAILY BEFORE BREAKFAST. 90 tablet 1   losartan (COZAAR) 100 MG tablet TAKE 1 TABLET BY MOUTH EVERY DAY 90 tablet 3   lovastatin (MEVACOR) 40 MG tablet TAKE 1 TABLET DAILY TO CONTROL CHOLESTEROL 90 tablet 1   metFORMIN (GLUCOPHAGE) 500 MG tablet Take one tablet by mouth before breakfast and One tablet before supper to control blood sugar. 180 tablet 1   NONFORMULARY OR COMPOUNDED ITEM Antifungal solution: Terbinafine 3%, Fluconazole 2%, Tea Tree Oil 5%, Urea 10%, Ibuprofen 2% in DMSO suspension #48mL 1 each 3   polyethylene glycol (MIRALAX) 17 g packet Take 17 g by mouth daily. Dissolve one cap full in solution (water, gatorade, etc.) and administer once cap-full daily. You may titrate up daily by  1 cap-full until the patient is having pudding consistency of stools. After the patient is able to start passing softer stools they will need to be on 1/2 cap-full daily for 2 weeks. 14 each 0   vitamin C (ASCORBIC ACID) 250 MG tablet Take 500 mg by mouth daily.     No current facility-administered medications on file prior to visit.    No Known Allergies Social History   Occupational History   Not on file  Tobacco Use   Smoking status: Former    Types: Cigarettes    Quit date: 10/27/1978    Years since quitting: 42.1   Smokeless tobacco: Never  Vaping Use   Vaping Use: Never used  Substance and Sexual Activity   Alcohol use: Not Currently    Comment: rarely   Drug use: No    Sexual activity: Not on file   Family History  Problem Relation Age of Onset   Cancer - Prostate Father    Stomach cancer Father    Hypertension Other    Colon cancer Neg Hx    Esophageal cancer Neg Hx    Rectal cancer Neg Hx    Immunization History  Administered Date(s) Administered   Fluad Quad(high Dose 68+) 09/30/2018, 10/06/2019, 10/06/2020   Influenza-Unspecified 09/16/2012, 09/17/2014, 09/17/2015, 10/17/2016, 10/19/2017   PFIZER Comirnaty(Gray Top)Covid-19 Tri-Sucrose Vaccine 07/07/2020   PFIZER(Purple Top)SARS-COV-2 Vaccination 02/04/2019, 02/25/2019   Pneumococcal Conjugate-13 05/12/2014, 03/18/2018   Pneumococcal Polysaccharide-23 10/29/2001, 04/04/2019   Tdap 03/06/2011     Review of Systems: Negative except as noted in the HPI.   Objective: There were no vitals filed for this visit.  Mitchell Gibbs is a pleasant 70 y.o. male in NAD. AAO X 3.  Vascular Examination: CFT immediate b/l LE. Palpable DP/PT pulses b/l LE. Digital hair sparse b/l. Skin temperature gradient WNL b/l. No pain with calf compression b/l. No edema noted b/l. No cyanosis or clubbing noted b/l LE.  Dermatological Examination: Pedal integument with normal turgor, texture and tone b/l LE. No open wounds b/l. No interdigital macerations b/l. Toenails 1-5 b/l elongated, thickened, discolored with subungual debris. +Tenderness with dorsal palpation of nailplates. Hyperkeratotic lesion(s) noted submet head 1 right foot, submet head 3 left foot, and submet head 5 b/l.  Musculoskeletal Examination: Muscle strength 5/5 to all lower extremity muscle groups bilaterally. HAV with bunion deformity noted b/l LE. Hammertoe deformity noted 2-5 b/l.  Footwear Assessment: Does the patient wear appropriate shoes? Yes. Does the patient need inserts/orthotics? Yes.  Neurological Examination: Protective sensation intact 5/5 intact bilaterally with 10g monofilament b/l. Vibratory sensation intact b/l. Proprioception  intact bilaterally.  Hemoglobin A1C Latest Ref Rng & Units 09/30/2020 04/05/2020  HGBA1C <5.7 % of total Hgb 6.1(H) 6.3(H)  Some recent data might be hidden   Assessment: 1. Pain due to onychomycosis of toenails of both feet   2. Callus   3. Hallux valgus, acquired, bilateral   4. Acquired hammertoes of both feet   5. Type 2 diabetes mellitus with stage 2 chronic kidney disease, with long-term current use of insulin ([place])   6. Encounter for diabetic foot exam ([place])      ADA Risk Categorization: Low Risk :  Patient has all of the following: Intact protective sensation No prior foot ulcer  No severe deformity Pedal pulses present  Plan: -Diabetic foot examination performed today. Discussed diabetic foot care principles. Continue foot and shoe gear inspection daily. -Patient to continue soft, supportive shoe gear daily. -Mycotic toenails 1-5 bilaterally were  debrided in length and girth with sterile nail nippers and dremel without incident. -Callus(es) submet head 1 right foot, submet head 3 left foot, and submet head 5 b/l pared utilizing sterile scalpel blade without complication or incident. Total number debrided =4. -Patient/POA to call should there be question/concern in the interim.  Return in about 3 months (around 03/03/2021).  [name], DPM

## 2020-12-23 ENCOUNTER — Encounter: Payer: Self-pay | Admitting: *Deleted

## 2020-12-23 DIAGNOSIS — E119 Type 2 diabetes mellitus without complications: Secondary | ICD-10-CM | POA: Diagnosis not present

## 2020-12-23 DIAGNOSIS — Z961 Presence of intraocular lens: Secondary | ICD-10-CM | POA: Diagnosis not present

## 2020-12-23 DIAGNOSIS — H52202 Unspecified astigmatism, left eye: Secondary | ICD-10-CM | POA: Diagnosis not present

## 2020-12-23 LAB — HM DIABETES EYE EXAM

## 2021-02-04 ENCOUNTER — Other Ambulatory Visit: Payer: Self-pay | Admitting: Nurse Practitioner

## 2021-02-18 ENCOUNTER — Other Ambulatory Visit: Payer: Self-pay | Admitting: Nurse Practitioner

## 2021-02-18 DIAGNOSIS — E1122 Type 2 diabetes mellitus with diabetic chronic kidney disease: Secondary | ICD-10-CM

## 2021-02-18 DIAGNOSIS — Z794 Long term (current) use of insulin: Secondary | ICD-10-CM

## 2021-02-21 ENCOUNTER — Other Ambulatory Visit: Payer: Self-pay | Admitting: Nurse Practitioner

## 2021-02-21 DIAGNOSIS — E782 Mixed hyperlipidemia: Secondary | ICD-10-CM

## 2021-03-14 ENCOUNTER — Ambulatory Visit: Payer: Medicare PPO | Admitting: Podiatry

## 2021-03-14 ENCOUNTER — Encounter: Payer: Self-pay | Admitting: Podiatry

## 2021-03-14 ENCOUNTER — Other Ambulatory Visit: Payer: Self-pay

## 2021-03-14 DIAGNOSIS — M79674 Pain in right toe(s): Secondary | ICD-10-CM | POA: Diagnosis not present

## 2021-03-14 DIAGNOSIS — N182 Chronic kidney disease, stage 2 (mild): Secondary | ICD-10-CM

## 2021-03-14 DIAGNOSIS — M79675 Pain in left toe(s): Secondary | ICD-10-CM

## 2021-03-14 DIAGNOSIS — L84 Corns and callosities: Secondary | ICD-10-CM

## 2021-03-14 DIAGNOSIS — E1122 Type 2 diabetes mellitus with diabetic chronic kidney disease: Secondary | ICD-10-CM

## 2021-03-14 DIAGNOSIS — Z794 Long term (current) use of insulin: Secondary | ICD-10-CM | POA: Diagnosis not present

## 2021-03-14 DIAGNOSIS — B351 Tinea unguium: Secondary | ICD-10-CM

## 2021-03-20 NOTE — Progress Notes (Signed)
°  Subjective:  Patient ID: Mitchell Gibbs, male    DOB: 1950-05-14,  MRN: [id number]  Mitchell Gibbs presents to clinic today for preventative diabetic foot care, at risk foot care. Pt has h/o NIDDM with chronic kidney disease, and callus(es) b/l feet and painful thick toenails that are difficult to trim. Painful toenails interfere with ambulation. Aggravating factors include wearing enclosed shoe gear. Pain is relieved with periodic professional debridement. Painful calluses are aggravated when weightbearing with and without shoegear. Pain is relieved with periodic professional debridement.  Patient states blood glucose was 105 mg/dl today.    New problem(s): None.   PCP is [name], NP , and last visit was June 23, 2020.  No Known Allergies  Review of Systems: Negative except as noted in the HPI. Objective:   Constitutional Mitchell Gibbs is a pleasant 71 y.o. African American male, WD, WN in NAD. AAO x 3.   Vascular Capillary refill time to digits immediate b/l. Palpable DP pulse(s) b/l LE. Palpable PT pulse(s) b/l LE. Pedal hair absent. No pain with calf compression b/l. Lower extremity skin temperature gradient within normal limits. No edema noted b/l LE. No cyanosis or clubbing noted b/l LE.  Neurologic Normal speech. Oriented to person, place, and time. Protective sensation intact 5/5 intact bilaterally with 10g monofilament b/l. Vibratory sensation intact b/l.  Dermatologic Pedal integument with normal turgor, texture and tone b/l LE. No open wounds b/l. No interdigital macerations b/l. Toenails 1-5 b/l elongated, thickened, discolored with subungual debris. +Tenderness with dorsal palpation of nailplates. Hyperkeratotic lesion(s) noted submet head 3 left foot and submet head 5 right foot.  Orthopedic: Muscle strength 5/5 to all lower extremity muscle groups bilaterally. Hallux valgus with bunion deformity noted b/l lower extremities. Hammertoe deformity noted 2-5 b/l.    Radiographs: None  Last A1c:  Hemoglobin A1C Latest Ref Rng & Units 09/30/2020 04/05/2020  HGBA1C <5.7 % of total Hgb 6.1(H) 6.3(H)  Some recent data might be hidden   Assessment:   1. Pain due to onychomycosis of toenails of both feet   2. Callus   3. Type 2 diabetes mellitus with stage 2 chronic kidney disease, with long-term current use of insulin ([place])    Plan:  Patient was evaluated and treated and all questions answered. Consent given for treatment as described below: -Toenails 1-5 b/l were debrided in length and girth with sterile nail nippers and dremel without iatrogenic bleeding.  -Callus(es) submet head 3 left foot and submet head 5 right foot pared utilizing sterile scalpel blade without complication or incident. Total number debrided =2. -Patient/POA to call should there be question/concern in the interim.  Return in about 3 months (around 06/11/2021).  [name], DPM

## 2021-04-07 ENCOUNTER — Other Ambulatory Visit: Payer: Medicare PPO

## 2021-04-07 ENCOUNTER — Other Ambulatory Visit: Payer: Self-pay

## 2021-04-07 DIAGNOSIS — Z794 Long term (current) use of insulin: Secondary | ICD-10-CM | POA: Diagnosis not present

## 2021-04-07 DIAGNOSIS — E890 Postprocedural hypothyroidism: Secondary | ICD-10-CM | POA: Diagnosis not present

## 2021-04-07 DIAGNOSIS — I10 Essential (primary) hypertension: Secondary | ICD-10-CM

## 2021-04-07 DIAGNOSIS — E782 Mixed hyperlipidemia: Secondary | ICD-10-CM

## 2021-04-07 DIAGNOSIS — E1122 Type 2 diabetes mellitus with diabetic chronic kidney disease: Secondary | ICD-10-CM

## 2021-04-07 DIAGNOSIS — N182 Chronic kidney disease, stage 2 (mild): Secondary | ICD-10-CM | POA: Diagnosis not present

## 2021-04-08 ENCOUNTER — Other Ambulatory Visit: Payer: Self-pay

## 2021-04-08 ENCOUNTER — Other Ambulatory Visit: Payer: Self-pay | Admitting: Nurse Practitioner

## 2021-04-08 DIAGNOSIS — E890 Postprocedural hypothyroidism: Secondary | ICD-10-CM

## 2021-04-08 LAB — COMPLETE METABOLIC PANEL WITH GFR
AG Ratio: 1.4 (calc) (ref 1.0–2.5)
ALT: 8 U/L — ABNORMAL LOW (ref 9–46)
AST: 14 U/L (ref 10–35)
Albumin: 4 g/dL (ref 3.6–5.1)
Alkaline phosphatase (APISO): 61 U/L (ref 35–144)
BUN/Creatinine Ratio: 11 (calc) (ref 6–22)
BUN: 16 mg/dL (ref 7–25)
CO2: 28 mmol/L (ref 20–32)
Calcium: 9.8 mg/dL (ref 8.6–10.3)
Chloride: 106 mmol/L (ref 98–110)
Creat: 1.48 mg/dL — ABNORMAL HIGH (ref 0.70–1.35)
Globulin: 2.9 g/dL (calc) (ref 1.9–3.7)
Glucose, Bld: 121 mg/dL — ABNORMAL HIGH (ref 65–99)
Potassium: 4.2 mmol/L (ref 3.5–5.3)
Sodium: 140 mmol/L (ref 135–146)
Total Bilirubin: 0.3 mg/dL (ref 0.2–1.2)
Total Protein: 6.9 g/dL (ref 6.1–8.1)
eGFR: 51 mL/min/{1.73_m2} — ABNORMAL LOW (ref >=60)

## 2021-04-08 LAB — CBC WITH DIFFERENTIAL/PLATELET
Absolute Monocytes: 359 cells/uL (ref 200–950)
Basophils Absolute: 31 cells/uL (ref 0–200)
Basophils Relative: 0.8 %
Eosinophils Absolute: 121 cells/uL (ref 15–500)
Eosinophils Relative: 3.1 %
HCT: 42.3 % (ref 38.5–50.0)
Hemoglobin: 13.7 g/dL (ref 13.2–17.1)
Lymphs Abs: 1385 cells/uL (ref 850–3900)
MCH: 26.9 pg — ABNORMAL LOW (ref 27.0–33.0)
MCHC: 32.4 g/dL (ref 32.0–36.0)
MCV: 83.1 fL (ref 80.0–100.0)
MPV: 9.9 fL (ref 7.5–12.5)
Monocytes Relative: 9.2 %
Neutro Abs: 2005 cells/uL (ref 1500–7800)
Neutrophils Relative %: 51.4 %
Platelets: 234 10*3/uL (ref 140–400)
RBC: 5.09 10*6/uL (ref 4.20–5.80)
RDW: 13.9 % (ref 11.0–15.0)
Total Lymphocyte: 35.5 %
WBC: 3.9 10*3/uL (ref 3.8–10.8)

## 2021-04-08 LAB — HEMOGLOBIN A1C
Hgb A1c MFr Bld: 6.9 % of total Hgb — ABNORMAL HIGH (ref <5.7)
Mean Plasma Glucose: 151 mg/dL
eAG (mmol/L): 8.4 mmol/L

## 2021-04-08 LAB — TSH: TSH: 28.42 mIU/L — ABNORMAL HIGH (ref 0.40–4.50)

## 2021-04-08 MED ORDER — LEVOTHYROXINE SODIUM 125 MCG PO TABS: 125.0000 ug | ORAL_TABLET | Freq: Every day | ORAL | 1 refills | Status: DC

## 2021-04-11 ENCOUNTER — Encounter: Payer: Self-pay | Admitting: Nurse Practitioner

## 2021-04-11 ENCOUNTER — Ambulatory Visit: Payer: Medicare PPO | Admitting: Nurse Practitioner

## 2021-04-11 ENCOUNTER — Other Ambulatory Visit: Payer: Self-pay

## 2021-04-11 VITALS — BP 138/90 | HR 62 | Temp 97.1°F | Ht 72.0 in | Wt 217.0 lb

## 2021-04-11 DIAGNOSIS — I10 Essential (primary) hypertension: Secondary | ICD-10-CM

## 2021-04-11 DIAGNOSIS — E890 Postprocedural hypothyroidism: Secondary | ICD-10-CM

## 2021-04-11 DIAGNOSIS — N182 Chronic kidney disease, stage 2 (mild): Secondary | ICD-10-CM

## 2021-04-11 DIAGNOSIS — E782 Mixed hyperlipidemia: Secondary | ICD-10-CM

## 2021-04-11 DIAGNOSIS — Z794 Long term (current) use of insulin: Secondary | ICD-10-CM

## 2021-04-11 DIAGNOSIS — E1122 Type 2 diabetes mellitus with diabetic chronic kidney disease: Secondary | ICD-10-CM | POA: Diagnosis not present

## 2021-04-11 MED ORDER — LEVOTHYROXINE SODIUM 112 MCG PO TABS: 112.0000 ug | ORAL_TABLET | Freq: Every day | ORAL | 1 refills | Status: DC

## 2021-04-11 NOTE — Progress Notes (Signed)
? ? ?Careteam: ?Patient Care Team: ?[name], NP as PCP - General (Geriatric Medicine) ?Leighton Ruff, [place] (Optometry) ? ?PLACE OF SERVICE:  ?[facility]  ?Advanced Directive information ?Does Patient Have a Medical Advance Directive?: No, Would patient like information on creating a medical advance directive?: No - Patient declined ? ?No Known Allergies ? ?Chief Complaint  ?Patient presents with  ? Medical Management of Chronic Issues  ?  6 month follow-up and discuss labs. Discuss need for shingrix and tdap. Patient denies receiving any vaccines since last visit. [facility] verified.   ? ? ? ?HPI: Patient is a 71 y.o. male for routine follow up. No complaints today. Here with wife.  ? ?Had forgotten his synthroid for several days prior the lab work.  ? ?States he has been drinking too much coffee. ? ?A1c at goal.  ? ?He is the primary caregiver for wife who has been having a lot of memory difficulties recently.  ?Review of Systems:  ?Review of Systems  ?Constitutional:  Negative for chills, fever and weight loss.  ?HENT:  Negative for tinnitus.   ?Respiratory:  Negative for cough, sputum production and shortness of breath.   ?Cardiovascular:  Negative for chest pain, palpitations and leg swelling.  ?Gastrointestinal:  Negative for abdominal pain, constipation, diarrhea and heartburn.  ?Genitourinary:  Negative for dysuria, frequency and urgency.  ?Musculoskeletal:  Negative for back pain, falls, joint pain and myalgias.  ?Skin: Negative.   ?Neurological:  Negative for dizziness and headaches.  ?Psychiatric/Behavioral:  Negative for depression and memory loss. The patient does not have insomnia.   ? ?Past Medical History:  ?Diagnosis Date  ? Bell's palsy   ? 2005  ? Carpal tunnel syndrome   ? Cataract   ? CKD stage 2 due to type 2 diabetes mellitus ([place]) 12/01/2015  ? Diabetes mellitus without complication ([place])   ? Edema   ? Gout, unspecified   ? Hyperlipidemia   ? Hypertension   ? Internal hemorrhoids  without mention of complication   ? Malignant neoplasm of thyroid gland ([place])   ? Memory loss   ? Mixed disorders as reaction to stress   ? Obesity, unspecified   ? Other malaise and fatigue   ? Other psoriasis   ? Sleep apnea   ? no CPAP used 09-23-15  ? Supraventricular premature beats   ? Thyroid disease   ? Trigger finger (acquired)   ? Unspecified sleep apnea   ? ?Past Surgical History:  ?Procedure Laterality Date  ? CATARACT EXTRACTION W/ INTRAOCULAR LENS IMPLANT Right 06/05/2016  ? Dr. Arlina Robes  ? COLONOSCOPY  10/07/2015  ? one polyp, Dr. Loletha Carrow  ? TOTAL THYROIDECTOMY  2007  ? Dr. Georgina Quint  ? ?Social History: ?  reports that he quit smoking about 42 years ago. His smoking use included cigarettes. He has never used smokeless tobacco. He reports that he does not currently use alcohol. He reports that he does not use drugs. ? ?Family History  ?Problem Relation Age of Onset  ? Cancer - Prostate Father   ? Stomach cancer Father   ? Hypertension Other   ? Colon cancer Neg Hx   ? Esophageal cancer Neg Hx   ? Rectal cancer Neg Hx   ? ? ?Medications: ?Patient's Medications  ?New Prescriptions  ? No medications on file  ?Previous Medications  ? ASPIRIN EC 81 MG TABLET    Take 81 mg by mouth daily.  ? B COMPLEX VITAMINS CAPSULE  Take 1 capsule by mouth daily.  ? INSULIN DETEMIR (LEVEMIR FLEXTOUCH) 100 UNIT/ML FLEXPEN    Inject 44 Units into the skin at bedtime. DX E11.22 , N18.2 , Z79.4  ? INSULIN PEN NEEDLE (B-D ULTRAFINE III SHORT PEN) 31G X 8 MM MISC    Use for insulin injections. Dx: E11.22  ? LEVOTHYROXINE (SYNTHROID) 125 MCG TABLET    Take 1 tablet (125 mcg total) by mouth daily before breakfast.  ? LOSARTAN (COZAAR) 100 MG TABLET    TAKE 1 TABLET BY MOUTH EVERY DAY  ? LOVASTATIN (MEVACOR) 40 MG TABLET    TAKE 1 TABLET DAILY TO CONTROL CHOLESTEROL  ? METFORMIN (GLUCOPHAGE) 500 MG TABLET    Take one tablet by mouth before breakfast and One tablet before supper to control blood sugar.  ? NONFORMULARY OR COMPOUNDED  ITEM    Antifungal solution: Terbinafine 3%, Fluconazole 2%, Tea Tree Oil 5%, Urea 10%, Ibuprofen 2% in DMSO suspension #72m  ? POLYETHYLENE GLYCOL (MIRALAX) 17 G PACKET    Take 17 g by mouth daily. Dissolve one cap full in solution (water, gatorade, etc.) and administer once cap-full daily. You may titrate up daily by 1 cap-full until the patient is having pudding consistency of stools. After the patient is able to start passing softer stools they will need to be on 1/2 cap-full daily for 2 weeks.  ? SILDENAFIL (VIAGRA) 100 MG TABLET    TAKE ONE TABLET BY MOUTH DAILY AS NEEDED FOR ERECTILE DYSFUNCTION  ? VITAMIN C (ASCORBIC ACID) 250 MG TABLET    Take 500 mg by mouth daily.  ?Modified Medications  ? No medications on file  ?Discontinued Medications  ? No medications on file  ? ? ?Physical Exam: ? ?Vitals:  ? 04/11/21 0815  ?BP: 138/90  ?Pulse: 62  ?Temp: (!) 97.1 ?F (36.2 ?C)  ?TempSrc: Temporal  ?SpO2: 99%  ?Weight: 217 lb (98.4 kg)  ?Height: 6' (1.829 m)  ? ?Body mass index is 29.43 kg/m?. ?Wt Readings from Last 3 Encounters:  ?04/11/21 217 lb (98.4 kg)  ?10/13/20 211 lb (95.7 kg)  ?10/06/20 217 lb (98.4 kg)  ? ? ?Physical Exam ?Constitutional:   ?   General: He is not in acute distress. ?   Appearance: He is well-developed. He is not diaphoretic.  ?HENT:  ?   Head: Normocephalic and atraumatic.  ?   Mouth/Throat:  ?   Pharynx: No oropharyngeal exudate.  ?Eyes:  ?   Conjunctiva/sclera: Conjunctivae normal.  ?   Pupils: Pupils are equal, round, and reactive to light.  ?Cardiovascular:  ?   Rate and Rhythm: Normal rate and regular rhythm.  ?   Heart sounds: Normal heart sounds.  ?Pulmonary:  ?   Effort: Pulmonary effort is normal.  ?   Breath sounds: Normal breath sounds.  ?Abdominal:  ?   General: Bowel sounds are normal.  ?   Palpations: Abdomen is soft.  ?Musculoskeletal:     ?   General: No tenderness.  ?   Cervical back: Normal range of motion and neck supple.  ?Skin: ?   General: Skin is warm and dry.   ?Neurological:  ?   Mental Status: He is alert and oriented to person, place, and time.  ? ? ?Labs reviewed: ?Basic Metabolic Panel: ?Recent Labs  ?  09/30/20 ?0786709/28/22 ?1955 04/07/21 ?06720 ?NA 141 139 140  ?K 3.5 4.1 4.2  ?CL 105 105 106  ?CO2 '27 26 28  '$ ?GLUCOSE 60* 88 121*  ?  BUN '17 15 16  '$ ?CREATININE 1.24 1.17 1.48*  ?CALCIUM 9.3 9.4 9.8  ?TSH 5.64*  --  28.42*  ? ?Liver Function Tests: ?Recent Labs  ?  06/14/20 ?2138 06/14/20 ?2200 09/30/20 ?2130 10/13/20 ?1953 04/07/21 ?8657  ?AST '24 19 20 20 14  '$ ?ALT 34 '14 11 15 '$ 8*  ?ALKPHOS 207* 56  --  55  --   ?BILITOT 1.2 0.7 0.5 0.8 0.3  ?PROT 6.4* 7.7 6.8 7.4 6.9  ?ALBUMIN 3.3* 3.9  --  3.9  --   ? ?No results for input(s): LIPASE, AMYLASE in the last 8760 hours. ?No results for input(s): AMMONIA in the last 8760 hours. ?CBC: ?Recent Labs  ?  09/30/20 ?8469 10/13/20 ?1955 04/07/21 ?6295  ?WBC 4.2 3.6* 3.9  ?NEUTROABS 2,171 1.7 2,005  ?HGB 12.8* 13.7 13.7  ?HCT 39.3 42.9 42.3  ?MCV 84.3 86.3 83.1  ?PLT 231 239 234  ? ?Lipid Panel: ?Recent Labs  ?  09/30/20 ?0836  ?CHOL 153  ?HDL 74  ?[place] 66  ?TRIG 44  ?CHOLHDL 2.1  ? ?TSH: ?Recent Labs  ?  09/30/20 ?2841 04/07/21 ?3244  ?TSH 5.64* 28.42*  ? ?A1C: ?Lab Results  ?Component Value Date  ? HGBA1C 6.9 (H) 04/07/2021  ? ? ? ?Assessment/Plan ?1. Postoperative hypothyroidism ?-will decrease dose back down of synthroid since he had missed frequent doses proior to last lab and recheck TSH in 8 weeks.  ?- levothyroxine (SYNTHROID) 112 MCG tablet; Take 1 tablet (112 mcg total) by mouth daily before breakfast.  Dispense: 90 tablet; Refill: 1 ? ?2. Type 2 diabetes mellitus with stage 2 chronic kidney disease, with long-term current use of insulin ([place]) ?-continues on levemir 44 units with metformin twice daily.  ?Encouraged dietary compliance, routine foot care/monitoring and to keep up with diabetic eye exams through ophthalmology  ? ?3. Mixed hyperlipidemia ?LDL at goal on last lab, continues on statin with dietary  modifications.  ? ?4. Essential hypertension ?Blood pressure well controlled ?Continue current medications ?follow metabolic panel ? ? ? ?Return in about 3 months (around 07/12/2021) for routine follow up . ?Pricilla Handler

## 2021-05-16 ENCOUNTER — Telehealth: Payer: Self-pay

## 2021-05-16 NOTE — Telephone Encounter (Signed)
Patient called expressing the need for a counselor or psych referral. Patient aware we have a contact list that I can provide him with. ? ? ?Patient asked that I send them via his mychart account. Sent as requested. ? ?Patient was also informed that if he tires the options provided and feels he needs something more or different we will place a referral to psych, patient verbalized understanding and expressed his gratitude. ? ?I will forward message to patients PCP as a FYI and for her to add any additional comments or responses if necessary.  ? ?

## 2021-05-16 NOTE — Telephone Encounter (Signed)
That list looks good. Thank you  ?

## 2021-06-03 ENCOUNTER — Other Ambulatory Visit: Payer: Medicare PPO

## 2021-06-03 DIAGNOSIS — E890 Postprocedural hypothyroidism: Secondary | ICD-10-CM

## 2021-06-07 ENCOUNTER — Other Ambulatory Visit: Payer: Medicare PPO

## 2021-06-07 DIAGNOSIS — E890 Postprocedural hypothyroidism: Secondary | ICD-10-CM | POA: Diagnosis not present

## 2021-06-08 LAB — TSH: TSH: 5.73 mIU/L — ABNORMAL HIGH (ref 0.40–4.50)

## 2021-06-20 ENCOUNTER — Encounter: Payer: Self-pay | Admitting: Podiatry

## 2021-06-20 ENCOUNTER — Ambulatory Visit: Payer: Medicare PPO | Admitting: Podiatry

## 2021-06-20 DIAGNOSIS — M79674 Pain in right toe(s): Secondary | ICD-10-CM | POA: Diagnosis not present

## 2021-06-20 DIAGNOSIS — Z794 Long term (current) use of insulin: Secondary | ICD-10-CM | POA: Diagnosis not present

## 2021-06-20 DIAGNOSIS — M79675 Pain in left toe(s): Secondary | ICD-10-CM

## 2021-06-20 DIAGNOSIS — B351 Tinea unguium: Secondary | ICD-10-CM

## 2021-06-20 DIAGNOSIS — L84 Corns and callosities: Secondary | ICD-10-CM

## 2021-06-20 DIAGNOSIS — N182 Chronic kidney disease, stage 2 (mild): Secondary | ICD-10-CM

## 2021-06-20 DIAGNOSIS — E1122 Type 2 diabetes mellitus with diabetic chronic kidney disease: Secondary | ICD-10-CM | POA: Diagnosis not present

## 2021-06-20 DIAGNOSIS — B353 Tinea pedis: Secondary | ICD-10-CM | POA: Diagnosis not present

## 2021-06-20 MED ORDER — KETOCONAZOLE 2 % EX CREA: 1.0000 "application " | TOPICAL_CREAM | Freq: Every day | CUTANEOUS | 0 refills | Status: AC

## 2021-06-25 NOTE — Progress Notes (Signed)
  Subjective:  Patient ID: Mitchell Gibbs, male    DOB: 07-Dec-1950,  MRN: [id number]  JAYVION STEFANSKI presents to clinic today for at risk foot care. Pt has h/o NIDDM with chronic kidney disease and callus(es) b/l lower extremities and painful thick toenails that are difficult to trim. Painful toenails interfere with ambulation. Aggravating factors include wearing enclosed shoe gear. Pain is relieved with periodic professional debridement. Painful calluses are aggravated when weightbearing with and without shoegear. Pain is relieved with periodic professional debridement.  Patient did not check blood glucose today.  New problem(s):  Scaling noted on the inside of his left ankle  PCP is [name], NP , and last visit was April 11, 2021.  No Known Allergies  Review of Systems: Negative except as noted in the HPI.  Objective:  Constitutional Mitchell Gibbs is a pleasant 71 y.o. African American male, WD, WN in NAD. AAO x 3.   Vascular Capillary refill time to digits immediate b/l. Palpable DP pulse(s) b/l LE. Palpable PT pulse(s) b/l LE. Pedal hair absent. No pain with calf compression b/l. Lower extremity skin temperature gradient within normal limits. No edema noted b/l LE. No cyanosis or clubbing noted b/l LE.  Neurologic Normal speech. Oriented to person, place, and time. Protective sensation intact 5/5 intact bilaterally with 10g monofilament b/l. Vibratory sensation intact b/l.  Dermatologic Pedal integument with normal turgor, texture and tone b/l LE. No open wounds b/l. No interdigital macerations b/l. Toenails 1-5 b/l elongated, thickened, discolored with subungual debris. +Tenderness with dorsal palpation of nailplates. Hyperkeratotic lesion(s) noted submet head 3 left foot and submet head 5 right foot. Scaling noted on the medial aspect of the left ankle extending to plantar aspect of left foot.  Orthopedic: Muscle strength 5/5 to all lower extremity muscle groups bilaterally.  Hallux valgus with bunion deformity noted b/l lower extremities. Hammertoe deformity noted 2-5 b/l.   Radiographs: None    Latest Ref Rng & Units 04/07/2021    9:05 AM 09/30/2020    8:36 AM  Hemoglobin A1C  Hemoglobin-A1c <5.7 % of total Hgb 6.9  6.1     Assessment/Plan: 1. Pain due to onychomycosis of toenails of both feet   2. Callus   3. Tinea pedis of both feet   4. Type 2 diabetes mellitus with stage 2 chronic kidney disease, with long-term current use of insulin ([place])     -Examined patient. -Mycotic toenails 1-5 bilaterally were debrided in length and girth with sterile nail nippers and dremel without incident. -Callus(es) submet head 3 left foot and submet head 5 right foot pared utilizing sterile scalpel blade without complication or incident. Total number debrided =2. -For tinea pedis, Rx sent to pharmacy for Ketoconazole Cream 2% to be applied once daily for six weeks. -Patient/POA to call should there be question/concern in the interim.   Return in about 3 months (around 09/20/2021).  [name], DPM

## 2021-07-22 ENCOUNTER — Emergency Department (HOSPITAL_COMMUNITY)
Admission: EM | Admit: 2021-07-22 | Discharge: 2021-07-22 | Disposition: A | Discharge status: Home / Self Care | Payer: Medicare PPO | Attending: Emergency Medicine | Admitting: Emergency Medicine

## 2021-07-22 ENCOUNTER — Encounter (HOSPITAL_COMMUNITY): Payer: Self-pay

## 2021-07-22 ENCOUNTER — Emergency Department (HOSPITAL_COMMUNITY): Payer: Medicare PPO

## 2021-07-22 DIAGNOSIS — S51812A Laceration without foreign body of left forearm, initial encounter: Secondary | ICD-10-CM | POA: Insufficient documentation

## 2021-07-22 DIAGNOSIS — Z794 Long term (current) use of insulin: Secondary | ICD-10-CM | POA: Insufficient documentation

## 2021-07-22 DIAGNOSIS — M25522 Pain in left elbow: Secondary | ICD-10-CM | POA: Insufficient documentation

## 2021-07-22 DIAGNOSIS — Z7982 Long term (current) use of aspirin: Secondary | ICD-10-CM | POA: Insufficient documentation

## 2021-07-22 DIAGNOSIS — Z23 Encounter for immunization: Secondary | ICD-10-CM | POA: Diagnosis not present

## 2021-07-22 DIAGNOSIS — M79632 Pain in left forearm: Secondary | ICD-10-CM | POA: Diagnosis not present

## 2021-07-22 DIAGNOSIS — W25XXXA Contact with sharp glass, initial encounter: Secondary | ICD-10-CM | POA: Diagnosis not present

## 2021-07-22 DIAGNOSIS — S59912A Unspecified injury of left forearm, initial encounter: Secondary | ICD-10-CM | POA: Diagnosis present

## 2021-07-22 DIAGNOSIS — S51012A Laceration without foreign body of left elbow, initial encounter: Secondary | ICD-10-CM | POA: Diagnosis not present

## 2021-07-22 MED ORDER — TETANUS-DIPHTH-ACELL PERTUSSIS 5-2.5-18.5 LF-MCG/0.5 IM SUSY
0.5000 mL | PREFILLED_SYRINGE | Freq: Once | INTRAMUSCULAR | Status: AC
  Administered 2021-07-22: 0.5 mL via INTRAMUSCULAR
  Filled 2021-07-22: qty 0.5
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
## 2021-07-22 NOTE — Discharge Instructions (Addendum)
Your xray did not show any acute fracture. Your tetanus immunization was updated on todays visit.    You may take anti-inflammatories such as aleve to help with your pain.

## 2021-08-04 DIAGNOSIS — B351 Tinea unguium: Secondary | ICD-10-CM | POA: Diagnosis not present

## 2021-08-04 DIAGNOSIS — E890 Postprocedural hypothyroidism: Secondary | ICD-10-CM | POA: Diagnosis not present

## 2021-08-04 DIAGNOSIS — I10 Essential (primary) hypertension: Secondary | ICD-10-CM | POA: Diagnosis not present

## 2021-08-04 DIAGNOSIS — E11620 Type 2 diabetes mellitus with diabetic dermatitis: Secondary | ICD-10-CM | POA: Diagnosis not present

## 2021-08-04 DIAGNOSIS — Z7984 Long term (current) use of oral hypoglycemic drugs: Secondary | ICD-10-CM | POA: Diagnosis not present

## 2021-08-04 DIAGNOSIS — Z7982 Long term (current) use of aspirin: Secondary | ICD-10-CM | POA: Diagnosis not present

## 2021-08-04 DIAGNOSIS — E785 Hyperlipidemia, unspecified: Secondary | ICD-10-CM | POA: Diagnosis not present

## 2021-08-04 DIAGNOSIS — N529 Male erectile dysfunction, unspecified: Secondary | ICD-10-CM | POA: Diagnosis not present

## 2021-08-04 DIAGNOSIS — Z794 Long term (current) use of insulin: Secondary | ICD-10-CM | POA: Diagnosis not present

## 2021-08-19 ENCOUNTER — Other Ambulatory Visit: Payer: Self-pay | Admitting: Nurse Practitioner

## 2021-08-19 DIAGNOSIS — E782 Mixed hyperlipidemia: Secondary | ICD-10-CM

## 2021-08-24 ENCOUNTER — Other Ambulatory Visit: Payer: Self-pay | Admitting: Nurse Practitioner

## 2021-08-24 DIAGNOSIS — Z794 Long term (current) use of insulin: Secondary | ICD-10-CM

## 2021-09-05 ENCOUNTER — Encounter: Payer: Medicare PPO | Admitting: Nurse Practitioner

## 2021-09-05 ENCOUNTER — Encounter: Payer: Self-pay | Admitting: Nurse Practitioner

## 2021-09-05 VITALS — Ht 72.0 in

## 2021-09-06 NOTE — Progress Notes (Signed)
err

## 2021-09-12 ENCOUNTER — Encounter: Payer: Self-pay | Admitting: Nurse Practitioner

## 2021-09-12 ENCOUNTER — Ambulatory Visit: Payer: Medicare PPO | Admitting: Nurse Practitioner

## 2021-09-12 VITALS — BP 130/80 | HR 62 | Temp 97.7°F | Resp 20 | Ht 72.0 in | Wt 214.8 lb

## 2021-09-12 DIAGNOSIS — E890 Postprocedural hypothyroidism: Secondary | ICD-10-CM | POA: Diagnosis not present

## 2021-09-12 DIAGNOSIS — E1122 Type 2 diabetes mellitus with diabetic chronic kidney disease: Secondary | ICD-10-CM | POA: Diagnosis not present

## 2021-09-12 DIAGNOSIS — I10 Essential (primary) hypertension: Secondary | ICD-10-CM | POA: Diagnosis not present

## 2021-09-12 DIAGNOSIS — Z636 Dependent relative needing care at home: Secondary | ICD-10-CM | POA: Diagnosis not present

## 2021-09-12 DIAGNOSIS — K5904 Chronic idiopathic constipation: Secondary | ICD-10-CM | POA: Diagnosis not present

## 2021-09-12 DIAGNOSIS — E782 Mixed hyperlipidemia: Secondary | ICD-10-CM

## 2021-09-12 DIAGNOSIS — N182 Chronic kidney disease, stage 2 (mild): Secondary | ICD-10-CM

## 2021-09-12 DIAGNOSIS — Z794 Long term (current) use of insulin: Secondary | ICD-10-CM | POA: Diagnosis not present

## 2021-09-12 DIAGNOSIS — R600 Localized edema: Secondary | ICD-10-CM

## 2021-09-12 DIAGNOSIS — G4700 Insomnia, unspecified: Secondary | ICD-10-CM

## 2021-09-12 MED ORDER — BLOOD GLUCOSE METER KIT: PACK | 0 refills | Status: AC

## 2021-09-12 NOTE — Progress Notes (Signed)
Careteam: Patient Care Team: [name], NP as PCP - General (Geriatric Medicine) Leighton Ruff, OD (Optometry)  PLACE OF SERVICE:  [place] Directive information    No Known Allergies  Chief Complaint  Patient presents with   Medical Management of Chronic Issues    Patient presents today for a 5 month follow-up.   Quality Metric Gaps    COVID #6, zoster     HPI: Patient is a 71 y.o. male   Presents today for routine follow up.   Talks about his wife and recent stressors r/t her care but states he is dealing well. Has a good support system with his children and grandchildren, as well as finds his strength in the [place].   Has not been checking his glucose at home, as his glucometer is broken. Needs a new glucose meter.Endorses a possible episode of hypoglycemia this past weekend that resolved after eating.   Has some left ankle swelling x 2 months.  Wears compression socks from podiatrist. It is not painful, no redness, no injury. No decrease range of motion. Has been eating more sodium recently. Improved in the morning and worse as the day progresses.    Review of Systems:  Review of Systems  Constitutional:  Negative for chills, fever and weight loss.  HENT:  Negative for hearing loss.   Eyes:  Negative for blurred vision.  Cardiovascular:  Positive for leg swelling. Negative for chest pain and palpitations.  Gastrointestinal:  Negative for abdominal pain, blood in stool, constipation, diarrhea and heartburn.  Genitourinary:  Negative for dysuria and frequency.  Musculoskeletal:  Negative for back pain, falls and joint pain.  Skin:  Negative for rash.  Neurological:  Negative for dizziness, weakness and headaches.  Psychiatric/Behavioral:  Positive for depression. The patient has insomnia. The patient is not nervous/anxious.     Past Medical History:  Diagnosis Date   Bell's palsy    2005   Carpal tunnel syndrome    Cataract    CKD stage  2 due to type 2 diabetes mellitus ([place]) 12/01/2015   Diabetes mellitus without complication (HCC)    Edema    Gout, unspecified    Hyperlipidemia    Hypertension    Internal hemorrhoids without mention of complication    Malignant neoplasm of thyroid gland (HCC)    Memory loss    Mixed disorders as reaction to stress    Obesity, unspecified    Other malaise and fatigue    Other psoriasis    Sleep apnea    no CPAP used 09-23-15   Supraventricular premature beats    Thyroid disease    Trigger finger (acquired)    Unspecified sleep apnea    Past Surgical History:  Procedure Laterality Date   CATARACT EXTRACTION W/ INTRAOCULAR LENS IMPLANT Right 06/05/2016   Dr. Arlina Robes   COLONOSCOPY  10/07/2015   one polyp, Dr. Loletha Carrow   TOTAL THYROIDECTOMY  2007   Dr. Georgina Quint   Social History:   reports that he quit smoking about 42 years ago. His smoking use included cigarettes. He has never used smokeless tobacco. He reports that he does not currently use alcohol. He reports that he does not use drugs.  Family History  Problem Relation Age of Onset   Cancer - Prostate Father    Stomach cancer Father    Hypertension Other    Colon cancer Neg Hx    Esophageal cancer Neg Hx    Rectal cancer  Neg Hx     Medications: Patient's Medications  New Prescriptions   No medications on file  Previous Medications   ASPIRIN EC 81 MG TABLET    Take 81 mg by mouth daily.   B COMPLEX VITAMINS CAPSULE    Take 1 capsule by mouth daily.   INSULIN DETEMIR (LEVEMIR FLEXTOUCH) 100 UNIT/ML FLEXPEN    Inject 44 Units into the skin at bedtime. DX E11.22 , N18.2 , Z79.4   INSULIN PEN NEEDLE (B-D ULTRAFINE III SHORT PEN) 31G X 8 MM MISC    Use for insulin injections. Dx: E11.22   LEVOTHYROXINE (SYNTHROID) 112 MCG TABLET    Take 1 tablet (112 mcg total) by mouth daily before breakfast.   LOSARTAN (COZAAR) 100 MG TABLET    TAKE 1 TABLET BY MOUTH EVERY DAY   LOVASTATIN (MEVACOR) 40 MG TABLET    TAKE 1 TABLET DAILY  TO CONTROL CHOLESTEROL   METFORMIN (GLUCOPHAGE) 500 MG TABLET    TAKE ONE TABLET BY MOUTH BEFORE BREAKFAST AND ONE TABLET BEFORE SUPPER TO CONTROL BLOOD SUGAR.   NONFORMULARY OR COMPOUNDED ITEM    Antifungal solution: Terbinafine 3%, Fluconazole 2%, Tea Tree Oil 5%, Urea 10%, Ibuprofen 2% in DMSO suspension #31m   POLYETHYLENE GLYCOL (MIRALAX) 17 G PACKET    Take 17 g by mouth daily. Dissolve one cap full in solution (water, gatorade, etc.) and administer once cap-full daily. You may titrate up daily by 1 cap-full until the patient is having pudding consistency of stools. After the patient is able to start passing softer stools they will need to be on 1/2 cap-full daily for 2 weeks.   SILDENAFIL (VIAGRA) 100 MG TABLET    TAKE ONE TABLET BY MOUTH DAILY AS NEEDED FOR ERECTILE DYSFUNCTION   VITAMIN C (ASCORBIC ACID) 250 MG TABLET    Take 500 mg by mouth daily.  Modified Medications   No medications on file  Discontinued Medications   LEVOTHYROXINE (SYNTHROID) 125 MCG TABLET    Take 125 mcg by mouth every morning.    Physical Exam:  Vitals:   09/12/21 1338  BP: 130/80  Pulse: 62  Resp: 20  Temp: 97.7 F (36.5 C)  SpO2: 99%  Weight: 97.4 kg  Height: 6' (1.829 m)   Body mass index is 29.13 kg/m. Wt Readings from Last 3 Encounters:  09/12/21 97.4 kg  04/11/21 98.4 kg  10/13/20 95.7 kg    Physical Exam Constitutional:      General: He is not in acute distress. HENT:     Right Ear: Tympanic membrane, ear canal and external ear normal. There is no impacted cerumen.     Left Ear: Tympanic membrane, ear canal and external ear normal. There is no impacted cerumen.     Nose: No congestion.     Mouth/Throat:     Mouth: Mucous membranes are moist.     Pharynx: Oropharynx is clear.  Eyes:     Conjunctiva/sclera: Conjunctivae normal.     Pupils: Pupils are equal, round, and reactive to light.  Cardiovascular:     Rate and Rhythm: Normal rate and regular rhythm.     Pulses: Normal  pulses.     Heart sounds: Normal heart sounds. No murmur heard. Pulmonary:     Effort: Pulmonary effort is normal. No respiratory distress.     Breath sounds: Normal breath sounds. No wheezing.  Abdominal:     General: Abdomen is flat. Bowel sounds are normal. There is no distension.  Palpations: Abdomen is soft.     Tenderness: There is no abdominal tenderness.  Musculoskeletal:     Right lower leg: Edema present.     Left lower leg: No edema.     Comments: ankle  Skin:    General: Skin is warm and dry.  Neurological:     Mental Status: He is alert and oriented to person, place, and time. Mental status is at baseline.     Motor: No weakness.  Psychiatric:        Behavior: Behavior normal.        Thought Content: Thought content normal.    Labs reviewed: Basic Metabolic Panel: Recent Labs    09/30/20 0836 10/13/20 1955 04/07/21 0905 06/07/21 1011  NA 141 139 140  --   K 3.5 4.1 4.2  --   CL 105 105 106  --   CO2 '27 26 28  '$ --   GLUCOSE 60* 88 121*  --   BUN '17 15 16  '$ --   CREATININE 1.24 1.17 1.48*  --   CALCIUM 9.3 9.4 9.8  --   TSH 5.64*  --  28.42* 5.73*   Liver Function Tests: Recent Labs    09/30/20 0836 10/13/20 1955 04/07/21 0905  AST '20 20 14  '$ ALT 11 15 8*  ALKPHOS  --  55  --   BILITOT 0.5 0.8 0.3  PROT 6.8 7.4 6.9  ALBUMIN  --  3.9  --    No results for input(s): "LIPASE", "AMYLASE" in the last 8760 hours. No results for input(s): "AMMONIA" in the last 8760 hours. CBC: Recent Labs    09/30/20 0836 10/13/20 1955 04/07/21 0905  WBC 4.2 3.6* 3.9  NEUTROABS 2,171 1.7 2,005  HGB 12.8* 13.7 13.7  HCT 39.3 42.9 42.3  MCV 84.3 86.3 83.1  PLT 231 239 234   Lipid Panel: Recent Labs    09/30/20 0836  CHOL 153  HDL 74  LDLCALC 66  TRIG 44  CHOLHDL 2.1   TSH: Recent Labs    09/30/20 0836 04/07/21 0905 06/07/21 1011  TSH 5.64* 28.42* 5.73*   A1C: Lab Results  Component Value Date   HGBA1C 6.9 (H) 04/07/2021      Assessment/Plan 1. Postoperative hypothyroidism - continue synthroid -TSH  2. Type 2 diabetes mellitus with stage 2 chronic kidney disease, with long-term current use of insulin (HCC) - continue metformin and insulin detemir -Encouraged dietary compliance, routine foot care/monitoring and to keep up with diabetic eye exams through ophthalmology  - ordered new glucometer - Hemoglobin A1c  3. Essential hypertension -controlled  -continue cozaar - COMPLETE METABOLIC PANEL WITH GFR - CBC with Differential/Platelet  4. Chronic idiopathic constipation - controlled  - continue miralax PRN   5. Mixed hyperlipidemia - continue lovastatin - Lipid panel   6.Insomnia - States he only sleeps about 3-4 hours a night, a lot of caregiver stress causing this.  - educated on sleep hygiene (decreasing blue light, setting a time to go to bed) - declined adding any medication including melatonin at this time  7. Lower leg edema - likely venous insuffiencey. encouraged to elevate legs above level of heart as tolerates, low sodium diet, compression hose as tolerates (on in am, off in pm)  8. Caregiver stress Continues to care for wife, has a lot of support from family.   Return in about 6 months (around 03/15/2022) for routine follow up .  Student- Waunita Schooner, RN -I personally was present during the history,  physical exam and medical decision-making activities of this service and have verified that the service and findings are accurately documented in the student's note  Cassanda Walmer K. Curtis, [facility] [phone]

## 2021-09-12 NOTE — Patient Instructions (Addendum)
Shingles vaccine- you can get at local pharmacy.

## 2021-09-13 LAB — COMPLETE METABOLIC PANEL WITH GFR
AG Ratio: 1.3 (calc) (ref 1.0–2.5)
ALT: 11 U/L (ref 9–46)
AST: 14 U/L (ref 10–35)
Albumin: 4 g/dL (ref 3.6–5.1)
Alkaline phosphatase (APISO): 64 U/L (ref 35–144)
BUN/Creatinine Ratio: 10 (calc) (ref 6–22)
BUN: 14 mg/dL (ref 7–25)
CO2: 29 mmol/L (ref 20–32)
Calcium: 9.8 mg/dL (ref 8.6–10.3)
Chloride: 105 mmol/L (ref 98–110)
Creat: 1.37 mg/dL — ABNORMAL HIGH (ref 0.70–1.35)
Globulin: 3.2 g/dL (calc) (ref 1.9–3.7)
Glucose, Bld: 85 mg/dL (ref 65–139)
Potassium: 4.6 mmol/L (ref 3.5–5.3)
Sodium: 141 mmol/L (ref 135–146)
Total Bilirubin: 0.4 mg/dL (ref 0.2–1.2)
Total Protein: 7.2 g/dL (ref 6.1–8.1)
eGFR: 56 mL/min/{1.73_m2} — ABNORMAL LOW (ref >=60)

## 2021-09-13 LAB — CBC WITH DIFFERENTIAL/PLATELET
Absolute Monocytes: 342 cells/uL (ref 200–950)
Basophils Absolute: 30 cells/uL (ref 0–200)
Basophils Relative: 0.8 %
Eosinophils Absolute: 68 cells/uL (ref 15–500)
Eosinophils Relative: 1.8 %
HCT: 41 % (ref 38.5–50.0)
Hemoglobin: 13.3 g/dL (ref 13.2–17.1)
Lymphs Abs: 1284 cells/uL (ref 850–3900)
MCH: 27.7 pg (ref 27.0–33.0)
MCHC: 32.4 g/dL (ref 32.0–36.0)
MCV: 85.4 fL (ref 80.0–100.0)
MPV: 9.9 fL (ref 7.5–12.5)
Monocytes Relative: 9 %
Neutro Abs: 2075 cells/uL (ref 1500–7800)
Neutrophils Relative %: 54.6 %
Platelets: 225 10*3/uL (ref 140–400)
RBC: 4.8 10*6/uL (ref 4.20–5.80)
RDW: 13.2 % (ref 11.0–15.0)
Total Lymphocyte: 33.8 %
WBC: 3.8 10*3/uL (ref 3.8–10.8)

## 2021-09-13 LAB — LIPID PANEL
Cholesterol: 165 mg/dL (ref <200)
HDL: 78 mg/dL (ref >=40)
LDL Cholesterol (Calc): 73 mg/dL (calc)
Non-HDL Cholesterol (Calc): 87 mg/dL (calc) (ref <130)
Total CHOL/HDL Ratio: 2.1 (calc) (ref <5.0)
Triglycerides: 56 mg/dL (ref <150)

## 2021-09-13 LAB — HEMOGLOBIN A1C
Hgb A1c MFr Bld: 6.8 % of total Hgb — ABNORMAL HIGH (ref <5.7)
Mean Plasma Glucose: 148 mg/dL
eAG (mmol/L): 8.2 mmol/L

## 2021-09-28 ENCOUNTER — Encounter: Payer: Self-pay | Admitting: Podiatry

## 2021-09-28 ENCOUNTER — Ambulatory Visit: Payer: Medicare PPO | Admitting: Podiatry

## 2021-09-28 DIAGNOSIS — M79675 Pain in left toe(s): Secondary | ICD-10-CM

## 2021-09-28 DIAGNOSIS — L84 Corns and callosities: Secondary | ICD-10-CM

## 2021-09-28 DIAGNOSIS — M79674 Pain in right toe(s): Secondary | ICD-10-CM | POA: Diagnosis not present

## 2021-09-28 DIAGNOSIS — Z794 Long term (current) use of insulin: Secondary | ICD-10-CM | POA: Diagnosis not present

## 2021-09-28 DIAGNOSIS — N182 Chronic kidney disease, stage 2 (mild): Secondary | ICD-10-CM

## 2021-09-28 DIAGNOSIS — E1122 Type 2 diabetes mellitus with diabetic chronic kidney disease: Secondary | ICD-10-CM

## 2021-09-28 DIAGNOSIS — B351 Tinea unguium: Secondary | ICD-10-CM

## 2021-10-02 NOTE — Progress Notes (Signed)
  Subjective:  Patient ID: Mitchell Gibbs, male    DOB: 09-18-1950,  MRN: [id number]  Mitchell Gibbs presents to clinic today for at risk foot care. Pt has h/o NIDDM with chronic kidney disease and callus(es) both feet and painful thick toenails that are difficult to trim. Painful toenails interfere with ambulation. Aggravating factors include wearing enclosed shoe gear. Pain is relieved with periodic professional debridement. Painful calluses are aggravated when weightbearing with and without shoegear. Pain is relieved with periodic professional debridement.  Last known HgA1c was unknown. Patient did not check blood glucose this morning.  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 She has recently been placed on medication.  New problem(s): None.   PCP is [name], NP , and last visit was  September 12, 2021.  No Known Allergies  Review of Systems: Negative except as noted in the HPI.  Objective:  Mitchell Gibbs is a pleasant 71 y.o. male in NAD. AAO x 3.  Vascular Capillary refill time to digits immediate b/l. Palpable DP pulse(s) b/l LE. Palpable PT pulse(s) b/l LE. Pedal hair absent. No pain with calf compression b/l. Lower extremity skin temperature gradient within normal limits. No edema noted b/l LE. No cyanosis or clubbing noted b/l LE.  Neurologic Normal speech. Oriented to person, place, and time. Protective sensation intact 5/5 intact bilaterally with 10g monofilament b/l. Vibratory sensation intact b/l.  Dermatologic Pedal integument with normal turgor, texture and tone b/l LE. No open wounds b/l. No interdigital macerations b/l. Toenails 1-5 b/l elongated, thickened, discolored with subungual debris. +Tenderness with dorsal palpation of nailplates. Hyperkeratotic lesion(s) noted submet head 3 left foot and submet head 5 right foot.   Orthopedic: Muscle strength 5/5 to all lower extremity muscle groups bilaterally. Hallux valgus with bunion deformity noted  b/l lower extremities. Hammertoe deformity noted 2-5 b/l.   Radiographs: None  Assessment/Plan: 1. Pain due to onychomycosis of toenails of both feet   2. Callus   3. Type 2 diabetes mellitus with stage 2 chronic kidney disease, with long-term current use of insulin ([place])     -Patient was evaluated and treated. All patient's and/or POA's questions/concerns answered on today's visit. -Toenails 1-5 b/l were debrided in length and girth with sterile nail nippers and dremel without iatrogenic bleeding.  -Callus(es) submet head 3 left foot and submet head 5 right foot pared utilizing sterile scalpel blade without complication or incident. Total number debrided =2. -Patient/POA to call should there be question/concern in the interim.   Return in about 3 months (around 12/28/2021).  [name], DPM

## 2021-10-27 ENCOUNTER — Encounter: Payer: Self-pay | Admitting: Nurse Practitioner

## 2021-12-02 ENCOUNTER — Ambulatory Visit (INDEPENDENT_AMBULATORY_CARE_PROVIDER_SITE_OTHER): Payer: Medicare PPO

## 2021-12-02 DIAGNOSIS — Z23 Encounter for immunization: Secondary | ICD-10-CM | POA: Diagnosis not present

## 2021-12-08 ENCOUNTER — Other Ambulatory Visit: Payer: Self-pay | Admitting: Nurse Practitioner

## 2021-12-15 ENCOUNTER — Other Ambulatory Visit: Payer: Self-pay | Admitting: Nurse Practitioner

## 2021-12-15 ENCOUNTER — Encounter: Payer: Self-pay | Admitting: Nurse Practitioner

## 2021-12-15 DIAGNOSIS — I10 Essential (primary) hypertension: Secondary | ICD-10-CM

## 2021-12-28 DIAGNOSIS — H52202 Unspecified astigmatism, left eye: Secondary | ICD-10-CM | POA: Diagnosis not present

## 2021-12-28 DIAGNOSIS — E119 Type 2 diabetes mellitus without complications: Secondary | ICD-10-CM | POA: Diagnosis not present

## 2021-12-28 LAB — HM DIABETES EYE EXAM

## 2021-12-30 ENCOUNTER — Ambulatory Visit: Payer: Medicare PPO | Admitting: Podiatry

## 2022-01-08 ENCOUNTER — Other Ambulatory Visit: Payer: Self-pay | Admitting: Nurse Practitioner

## 2022-01-08 DIAGNOSIS — I10 Essential (primary) hypertension: Secondary | ICD-10-CM

## 2022-02-13 ENCOUNTER — Ambulatory Visit: Payer: Medicare PPO | Admitting: Podiatry

## 2022-02-13 ENCOUNTER — Encounter: Payer: Self-pay | Admitting: Podiatry

## 2022-02-13 VITALS — BP 164/105

## 2022-02-13 DIAGNOSIS — L84 Corns and callosities: Secondary | ICD-10-CM

## 2022-02-13 DIAGNOSIS — M2042 Other hammer toe(s) (acquired), left foot: Secondary | ICD-10-CM

## 2022-02-13 DIAGNOSIS — M79674 Pain in right toe(s): Secondary | ICD-10-CM | POA: Diagnosis not present

## 2022-02-13 DIAGNOSIS — M2012 Hallux valgus (acquired), left foot: Secondary | ICD-10-CM

## 2022-02-13 DIAGNOSIS — M79675 Pain in left toe(s): Secondary | ICD-10-CM | POA: Diagnosis not present

## 2022-02-13 DIAGNOSIS — E1122 Type 2 diabetes mellitus with diabetic chronic kidney disease: Secondary | ICD-10-CM | POA: Diagnosis not present

## 2022-02-13 DIAGNOSIS — B351 Tinea unguium: Secondary | ICD-10-CM | POA: Diagnosis not present

## 2022-02-13 DIAGNOSIS — E119 Type 2 diabetes mellitus without complications: Secondary | ICD-10-CM | POA: Diagnosis not present

## 2022-02-13 DIAGNOSIS — M2011 Hallux valgus (acquired), right foot: Secondary | ICD-10-CM | POA: Diagnosis not present

## 2022-02-13 DIAGNOSIS — M2041 Other hammer toe(s) (acquired), right foot: Secondary | ICD-10-CM | POA: Diagnosis not present

## 2022-02-13 DIAGNOSIS — Z794 Long term (current) use of insulin: Secondary | ICD-10-CM | POA: Diagnosis not present

## 2022-02-13 DIAGNOSIS — N182 Chronic kidney disease, stage 2 (mild): Secondary | ICD-10-CM

## 2022-02-13 NOTE — Patient Instructions (Signed)
Apply lotion to feet once daily. Apply Remedy Silicone Cream afterwards to feet. Do not apply either between the toes.

## 2022-02-13 NOTE — Progress Notes (Unsigned)
ANNUAL DIABETIC FOOT EXAM  Subjective: Mitchell Gibbs presents today {jgcomplaint:23593}.  Chief Complaint  Patient presents with   Nail Problem    DFC BS-did not check today A1C-do not know PCP-Mitchell Gibbs PCP VST-6  months ago      Patient confirms h/o diabetes.  Patient relates {Numbers; 0-100:15068} year h/o diabetes.  Patient denies any h/o foot wounds.  Patient has h/o foot ulcer of {jgPodToeLocator:23637}, which healed via help of ***.  Patient has h/o amputation(s): {jgamp:23617}.  Patient endorses symptoms of foot numbness.   Patient endorses symptoms of foot tingling.  Patient endorses symptoms of burning in feet.  Patient endorses symptoms of pins/needles sensation in feet.  Patient denies any numbness, tingling, burning, or pins/needle sensation in feet.  Patient has been diagnosed with neuropathy and it is managed with {JGNEUROPATHYMEDS:27053}.  Risk factors: {jgriskfactors:24044}.  Mitchell Chandler, NP is patient's PCP. Last visit was {Time; dates multiple:15870}***.  Past Medical History:  Diagnosis Date   Bell's palsy    2005   Carpal tunnel syndrome    Cataract    CKD stage 2 due to type 2 diabetes mellitus ([place]) 12/01/2015   Diabetes mellitus without complication (HCC)    Edema    Gout, unspecified    Hyperlipidemia    Hypertension    Internal hemorrhoids without mention of complication    Malignant neoplasm of thyroid gland (HCC)    Memory loss    Mixed disorders as reaction to stress    Obesity, unspecified    Other malaise and fatigue    Other psoriasis    Sleep apnea    no CPAP used 09-23-15   Supraventricular premature beats    Thyroid disease    Trigger finger (acquired)    Unspecified sleep apnea    Patient Active Problem List   Diagnosis Date Noted   Postoperative hypothyroidism 05/16/2017   CKD stage 2 due to type 2 diabetes mellitus ([place]) 12/01/2015   BPPV (benign paroxysmal positional vertigo) 10/06/2014   Tinea pedis  of left foot 02/09/2014   Erectile dysfunction 03/12/2013   DM (diabetes mellitus), type 2 with renal complications ([place]) 37/16/9678   Hypertension    Unspecified sleep apnea    Hyperlipidemia    Past Surgical History:  Procedure Laterality Date   CATARACT EXTRACTION W/ INTRAOCULAR LENS IMPLANT Right 06/05/2016   Dr. Arlina Gibbs   COLONOSCOPY  10/07/2015   one polyp, Dr. Loletha Gibbs   TOTAL THYROIDECTOMY  2007   Dr. Georgina Gibbs   Current Outpatient Medications on File Prior to Visit  Medication Sig Dispense Refill   aspirin EC 81 MG tablet Take 81 mg by mouth daily.     b complex vitamins capsule Take 1 capsule by mouth daily.     blood glucose meter kit and supplies Dispense based on patient and insurance preference. Use up to four times daily as directed. (FOR ICD-10 E10.9, E11.9). 1 each 0   glucose blood (ACCU-CHEK GUIDE) test strip USE AS DIRECTED 100 strip 2   insulin detemir (LEVEMIR FLEXTOUCH) 100 UNIT/ML FlexPen Inject 44 Units into the skin at bedtime. DX E11.22 , N18.2 , Z79.4 15 mL 11   Insulin Pen Needle (B-D ULTRAFINE III SHORT PEN) 31G X 8 MM MISC Use for insulin injections. Dx: E11.22 300 each 3   levothyroxine (SYNTHROID) 112 MCG tablet Take 1 tablet (112 mcg total) by mouth daily before breakfast. 90 tablet 1   losartan (COZAAR) 100 MG tablet TAKE 1 TABLET BY MOUTH EVERY DAY  90 tablet 3   lovastatin (MEVACOR) 40 MG tablet TAKE 1 TABLET DAILY TO CONTROL CHOLESTEROL 90 tablet 1   metFORMIN (GLUCOPHAGE) 500 MG tablet TAKE ONE TABLET BY MOUTH BEFORE BREAKFAST AND ONE TABLET BEFORE SUPPER TO CONTROL BLOOD SUGAR. 180 tablet 1   NONFORMULARY OR COMPOUNDED ITEM Antifungal solution: Terbinafine 3%, Fluconazole 2%, Tea Tree Oil 5%, Urea 10%, Ibuprofen 2% in DMSO suspension #40m 1 each 3   polyethylene glycol (MIRALAX) 17 g packet Take 17 g by mouth daily. Dissolve one cap full in solution (water, gatorade, etc.) and administer once cap-full daily. You may titrate up daily by 1 cap-full  until the patient is having pudding consistency of stools. After the patient is able to start passing softer stools they will need to be on 1/2 cap-full daily for 2 weeks. 14 each 0   sildenafil (VIAGRA) 100 MG tablet TAKE ONE TABLET BY MOUTH DAILY AS NEEDED FOR ERECTILE DYSFUNCTION 22 tablet 5   vitamin C (ASCORBIC ACID) 250 MG tablet Take 500 mg by mouth daily.     No current facility-administered medications on file prior to visit.    No Known Allergies Social History   Occupational History   Not on file  Tobacco Use   Smoking status: Former    Types: Cigarettes    Quit date: 10/27/1978    Years since quitting: 43.3   Smokeless tobacco: Never  Vaping Use   Vaping Use: Never used  Substance and Sexual Activity   Alcohol use: Not Currently    Comment: rarely   Drug use: No   Sexual activity: Not on file   Family History  Problem Relation Age of Onset   Cancer - Prostate Father    Stomach cancer Father    Hypertension Other    Colon cancer Neg Hx    Esophageal cancer Neg Hx    Rectal cancer Neg Hx    Immunization History  Administered Date(s) Administered   Fluad Quad(high Dose 65+) 09/30/2018, 10/06/2019, 10/06/2020, 12/02/2021   Influenza-Unspecified 09/16/2012, 09/17/2014, 09/17/2015, 10/17/2016, 10/19/2017   PFIZER Comirnaty(Gray Top)Covid-19 Tri-Sucrose Vaccine 07/07/2020   PFIZER(Purple Top)SARS-COV-2 Vaccination 02/04/2019, 02/25/2019, 10/15/2019   Pfizer Covid-19 Vaccine Bivalent Booster 174yr& up 10/20/2020   Pneumococcal Conjugate-13 05/12/2014, 03/18/2018   Pneumococcal Polysaccharide-23 10/29/2001, 04/04/2019   Tdap 03/06/2011, 07/22/2021     Review of Systems: Negative except as noted in the HPI.   Objective: Vitals:   02/13/22 0937 02/13/22 0947  BP: (!) 176/103 (!) 164/105    RoAAHIL FREDINs a pleasant [age].o. male in NAD. AAO X 3.  Vascular Examination: {jgvascular:23595}  Dermatological Examination: {jgderm:23598}  Neurological  Examination: {jgneuro:23601::"Protective sensation intact 5/5 intact bilaterally with 10g monofilament b/l.","Vibratory sensation intact b/l.","Proprioception intact bilaterally."}  Musculoskeletal Examination: {jgmsk:23600}  Footwear Assessment: Does the patient wear appropriate shoes? {Yes,No}. Does the patient need inserts/orthotics? {Yes,No}.  Lab Results  Component Value Date   HGBA1C 6.8 (H) 09/12/2021    No results found. ADA Risk Categorization: Low Risk :  Patient has all of the following: Intact protective sensation No prior foot ulcer  No severe deformity Pedal pulses present  High Risk  Patient has one or more of the following: Loss of protective sensation Absent pedal pulses Severe Foot deformity History of foot ulcer  Assessment: No diagnosis found.   Plan: No orders of the defined types were placed in this encounter.   No orders of the defined types were placed in this encounter.   None  {jgplan:23602::"-Patient/POA  to call should there be question/concern in the interim."} Return in about 3 months (around 05/15/2022).  [name], DPM

## 2022-03-01 DIAGNOSIS — Z6828 Body mass index (BMI) 28.0-28.9, adult: Secondary | ICD-10-CM | POA: Diagnosis not present

## 2022-03-01 DIAGNOSIS — E785 Hyperlipidemia, unspecified: Secondary | ICD-10-CM | POA: Diagnosis not present

## 2022-03-01 DIAGNOSIS — Z794 Long term (current) use of insulin: Secondary | ICD-10-CM | POA: Diagnosis not present

## 2022-03-01 DIAGNOSIS — Z7189 Other specified counseling: Secondary | ICD-10-CM | POA: Diagnosis not present

## 2022-03-01 DIAGNOSIS — E119 Type 2 diabetes mellitus without complications: Secondary | ICD-10-CM | POA: Diagnosis not present

## 2022-03-01 DIAGNOSIS — Z85850 Personal history of malignant neoplasm of thyroid: Secondary | ICD-10-CM | POA: Diagnosis not present

## 2022-03-01 DIAGNOSIS — I10 Essential (primary) hypertension: Secondary | ICD-10-CM | POA: Diagnosis not present

## 2022-03-01 DIAGNOSIS — Z0000 Encounter for general adult medical examination without abnormal findings: Secondary | ICD-10-CM | POA: Diagnosis not present

## 2022-03-01 DIAGNOSIS — E663 Overweight: Secondary | ICD-10-CM | POA: Diagnosis not present

## 2022-03-08 ENCOUNTER — Telehealth: Payer: Self-pay

## 2022-03-08 NOTE — Telephone Encounter (Signed)
Spoke with Janett Billow. Janett Billow advised that patient follow the [employer], avoid high fat or spicy foods. Drink 8 oz of electrolyte fluids with every episode of diarrhea, take a probiotic and seek medical attention at Urgent Care or the emergency room if increased weakness or confusion.   Spoke with patient and relayed the above. Patient verbalized understanding and stated he will call if he feels better in the am to cancel pm appointment.

## 2022-03-08 NOTE — Telephone Encounter (Signed)
Patient called to say overall he is not feeling well. Patient with no appetite, severe diarrhea on 03/06/2022, and kind of out of it, was the best way he could explain it. Patient denies fever, chills, vomiting, nausea, or URI symptoms.   Patient states he took a dose of Theraflu yesterday and was feeling slight better and tried to eat some soup today and now he is back a square one of feeling bad.   Patient is able to drink fluids and admits to staying hydrated  I scheduled patient an appointment although he was hesitant to scheduled.  Patient state he would like to know if [name], NP can give any recommendations to avoid him having to come to office.

## 2022-03-09 ENCOUNTER — Encounter: Payer: Medicare PPO | Admitting: Orthopedic Surgery

## 2022-03-10 NOTE — Progress Notes (Signed)
This encounter was created in error - please disregard.

## 2022-03-16 ENCOUNTER — Encounter: Payer: Self-pay | Admitting: Nurse Practitioner

## 2022-03-16 NOTE — Patient Instructions (Addendum)
1.) STOP at check out and schedule an Annual Wellness Visit for video or in-person.   2.) Visit your local pharmacy to update your shingrix and covid boosters

## 2022-03-17 ENCOUNTER — Ambulatory Visit: Payer: Medicare PPO | Admitting: Nurse Practitioner

## 2022-03-17 ENCOUNTER — Encounter: Payer: Self-pay | Admitting: Nurse Practitioner

## 2022-03-17 VITALS — BP 136/84 | HR 71 | Temp 97.9°F | Ht 72.0 in | Wt 217.0 lb

## 2022-03-17 DIAGNOSIS — K649 Unspecified hemorrhoids: Secondary | ICD-10-CM

## 2022-03-17 DIAGNOSIS — Z794 Long term (current) use of insulin: Secondary | ICD-10-CM | POA: Diagnosis not present

## 2022-03-17 DIAGNOSIS — E1122 Type 2 diabetes mellitus with diabetic chronic kidney disease: Secondary | ICD-10-CM

## 2022-03-17 DIAGNOSIS — N182 Chronic kidney disease, stage 2 (mild): Secondary | ICD-10-CM

## 2022-03-17 DIAGNOSIS — I10 Essential (primary) hypertension: Secondary | ICD-10-CM | POA: Diagnosis not present

## 2022-03-17 DIAGNOSIS — Z636 Dependent relative needing care at home: Secondary | ICD-10-CM | POA: Diagnosis not present

## 2022-03-17 DIAGNOSIS — E782 Mixed hyperlipidemia: Secondary | ICD-10-CM

## 2022-03-17 DIAGNOSIS — E890 Postprocedural hypothyroidism: Secondary | ICD-10-CM | POA: Diagnosis not present

## 2022-03-17 MED ORDER — BLOOD PRESSURE CUFF MISC: 0 refills | Status: DC

## 2022-03-17 MED ORDER — METFORMIN HCL 500 MG PO TABS: 500.0000 mg | ORAL_TABLET | Freq: Two times a day (BID) | ORAL | 1 refills | Status: DC

## 2022-03-17 NOTE — Progress Notes (Signed)
Careteam: Patient Care Team: [name], NP as PCP - General (Geriatric Medicine) Leighton Ruff, OD (Optometry)  PLACE OF SERVICE:  [place] Directive information Does Patient Have a Medical Advance Directive?: No, Would patient like information on creating a medical advance directive?: Yes (MAU/Ambulatory/Procedural Areas - Information given)  No Known Allergies  Chief Complaint  Patient presents with   Medical Management of Chronic Issues    6 month follow-up. Discuss need for AWV, shingrix, covid booster, A1c, and diabetic kidney evaluation. Discuss metformin, patient thinks he has been out x several months. 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       HPI: Patient is a 72 y.o. male here for medical management of chronic issues.   The patient reports that he has been out of metformin for months. 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 He isn't sure if he got a refill after his last appointment at all.  He eats at home and eats take-out. He eats fried foods sometimes, and sweets sometimes. He isn't able to do a lot of physical activity because of the care giving for his wife.   Denies any headaches, blurry vision, numbness, tingling, polyuria, polydipsia. No palpitations, chest pain, shortness of breath.   He discusses how his wife is doing a lot. I asked him how his stress levels were managed and how his mood is doing. He says they are doing well, and that he is religious and this keeps him strong.   No complaints today.  Follows with eye doctor and ophthalmologist regularly.  Having some constipation sometimes. Has hemorrhoids and occasional bleeding with straining, but no rectal discomfort. He says he had surgery before for his hemorrhoids.    Review of Systems:  Review of Systems  Constitutional:  Negative for chills, fever, malaise/fatigue and  weight loss.  HENT:  Negative for congestion and sore throat.   Eyes:  Negative for blurred vision.  Respiratory:  Negative for cough, shortness of breath and wheezing.   Cardiovascular:  Negative for chest pain, palpitations and leg swelling.  Gastrointestinal:  Negative for abdominal pain, blood in stool, constipation, diarrhea, heartburn, nausea and vomiting.  Genitourinary:  Negative for dysuria, frequency, hematuria and urgency.  Musculoskeletal:  Negative for falls and joint pain.  Skin:  Negative for rash.  Neurological:  Negative for dizziness, tingling and headaches.  Endo/Heme/Allergies:  Negative for polydipsia.  Psychiatric/Behavioral:  Negative for depression. The patient is not nervous/anxious.   All other systems reviewed and are negative.   Past Medical History:  Diagnosis Date   Bell's palsy    2005   Carpal tunnel syndrome    Cataract    CKD stage 2 due to type 2 diabetes mellitus ([place]) 12/01/2015   Diabetes mellitus without complication (HCC)    Edema    Gout, unspecified    Hyperlipidemia    Hypertension    Internal hemorrhoids without mention of complication    Malignant neoplasm of thyroid gland (HCC)    Memory loss    Mixed disorders as reaction to stress    Obesity, unspecified    Other malaise and fatigue    Other psoriasis    Sleep apnea    no CPAP used 09-23-15   Supraventricular premature beats    Thyroid disease    Trigger finger (acquired)    Unspecified sleep apnea    Past  Surgical History:  Procedure Laterality Date   CATARACT EXTRACTION W/ INTRAOCULAR LENS IMPLANT Right 06/05/2016   Dr. Arlina Robes   COLONOSCOPY  10/07/2015   one polyp, Dr. Loletha Carrow   TOTAL THYROIDECTOMY  2007   Dr. Georgina Quint   Social History:   reports that he quit smoking about 43 years ago. His smoking use included cigarettes. He has never used smokeless tobacco. He reports that he does not currently use alcohol. He reports that he does not use drugs.  Family History   Problem Relation Age of Onset   Cancer - Prostate Father    Stomach cancer Father    Hypertension Other    Colon cancer Neg Hx    Esophageal cancer Neg Hx    Rectal cancer Neg Hx     Medications: Patient's Medications  New Prescriptions   BLOOD PRESSURE MONITORING (BLOOD PRESSURE CUFF) MISC    To check blood pressure three times weekly after taking medication  Previous Medications   ASPIRIN EC 81 MG TABLET    Take 81 mg by mouth daily.   B COMPLEX VITAMINS CAPSULE    Take 1 capsule by mouth daily.   BLOOD GLUCOSE METER KIT AND SUPPLIES    Dispense based on patient and insurance preference. Use up to four times daily as directed. (FOR ICD-10 E10.9, E11.9).   GLUCOSE BLOOD (ACCU-CHEK GUIDE) TEST STRIP    USE AS DIRECTED   INSULIN DETEMIR (LEVEMIR FLEXTOUCH) 100 UNIT/ML FLEXPEN    Inject 44 Units into the skin at bedtime. DX E11.22 , N18.2 , Z79.4   INSULIN PEN NEEDLE (B-D ULTRAFINE III SHORT PEN) 31G X 8 MM MISC    Use for insulin injections. Dx: E11.22   LEVOTHYROXINE (SYNTHROID) 112 MCG TABLET    Take 1 tablet (112 mcg total) by mouth daily before breakfast.   LOSARTAN (COZAAR) 100 MG TABLET    TAKE 1 TABLET BY MOUTH EVERY DAY   LOVASTATIN (MEVACOR) 40 MG TABLET    TAKE 1 TABLET DAILY TO CONTROL CHOLESTEROL   NONFORMULARY OR COMPOUNDED ITEM    Antifungal solution: Terbinafine 3%, Fluconazole 2%, Tea Tree Oil 5%, Urea 10%, Ibuprofen 2% in DMSO suspension #66m   SILDENAFIL (VIAGRA) 100 MG TABLET    TAKE ONE TABLET BY MOUTH DAILY AS NEEDED FOR ERECTILE DYSFUNCTION   VITAMIN C (ASCORBIC ACID) 250 MG TABLET    Take 500 mg by mouth daily.  Modified Medications   Modified Medication Previous Medication   METFORMIN (GLUCOPHAGE) 500 MG TABLET metFORMIN (GLUCOPHAGE) 500 MG tablet      Take 1 tablet (500 mg total) by mouth 2 (two) times daily with a meal.    TAKE ONE TABLET BY MOUTH BEFORE BREAKFAST AND ONE TABLET BEFORE SUPPER TO CONTROL BLOOD SUGAR.  Discontinued Medications   POLYETHYLENE  GLYCOL (MIRALAX) 17 G PACKET    Take 17 g by mouth daily. Dissolve one cap full in solution (water, gatorade, etc.) and administer once cap-full daily. You may titrate up daily by 1 cap-full until the patient is having pudding consistency of stools. After the patient is able to start passing softer stools they will need to be on 1/2 cap-full daily for 2 weeks.    Physical Exam:  Vitals:   03/17/22 0739 03/17/22 0827  BP: (!) 142/90 136/84  Pulse: 71   Temp: 97.9 F (36.6 C)   TempSrc: Temporal   SpO2: 99%   Weight: 217 lb (98.4 kg)   Height: 6' (1.829 m)  Body mass index is 29.43 kg/m. Wt Readings from Last 3 Encounters:  03/17/22 217 lb (98.4 kg)  09/12/21 214 lb 12.8 oz (97.4 kg)  04/11/21 217 lb (98.4 kg)    Physical Exam Vitals and nursing note reviewed. Exam conducted with a chaperone present.  Cardiovascular:     Rate and Rhythm: Normal rate and regular rhythm.     Heart sounds: Normal heart sounds. No murmur heard. Pulmonary:     Breath sounds: Normal breath sounds. No wheezing or rales.  Abdominal:     General: Bowel sounds are normal.     Palpations: Abdomen is soft. There is no mass.     Tenderness: There is no abdominal tenderness. There is no guarding.  Musculoskeletal:        General: No swelling or tenderness.  Skin:    General: Skin is warm and dry.  Neurological:     Mental Status: He is alert and oriented to person, place, and time. Mental status is at baseline.  Psychiatric:        Mood and Affect: Mood normal.        Behavior: Behavior normal.        Judgment: Judgment normal.     Labs reviewed: Basic Metabolic Panel: Recent Labs    04/07/21 0905 06/07/21 1011 09/12/21 1438  NA 140  --  141  K 4.2  --  4.6  CL 106  --  105  CO2 28  --  29  GLUCOSE 121*  --  85  BUN 16  --  14  CREATININE 1.48*  --  1.37*  CALCIUM 9.8  --  9.8  TSH 28.42* 5.73*  --    Liver Function Tests: Recent Labs    04/07/21 0905 09/12/21 1438  AST 14 14   ALT 8* 11  BILITOT 0.3 0.4  PROT 6.9 7.2   No results for input(s): "LIPASE", "AMYLASE" in the last 8760 hours. No results for input(s): "AMMONIA" in the last 8760 hours. CBC: Recent Labs    04/07/21 0905 09/12/21 1438  WBC 3.9 3.8  NEUTROABS 2,005 2,075  HGB 13.7 13.3  HCT 42.3 41.0  MCV 83.1 85.4  PLT 234 225   Lipid Panel: Recent Labs    09/12/21 1438  CHOL 165  HDL 78  LDLCALC 73  TRIG 56  CHOLHDL 2.1   TSH: Recent Labs    04/07/21 0905 06/07/21 1011  TSH 28.42* 5.73*   A1C: Lab Results  Component Value Date   HGBA1C 6.8 (H) 09/12/2021     Assessment/Plan 1. Essential hypertension He has not taken his BP medicine today. He is going to try to get a BP cuff at home and keep a log for [place]. He will send [place] a mychart message in a couple of weeks with his Bps. Discussed diet modifications with patient, avoiding fried and salty foods.  - Complete Metabolic Panel with eGFR - CBC With Differential/Platelet  2. Type 2 diabetes mellitus with stage 2 chronic kidney disease, with long-term current use of insulin ([place]) Patient has not been taking metformin since last August. Check A1C today. He hasn't been checking his fasting sugars at home in a long time. Discussed diet and lifestyle modifications with patient. Restart metformin and adjust medications as needed pending labs. - Microalbumin/Creatinine Ratio, Urine - Hemoglobin A1c - metFORMIN (GLUCOPHAGE) 500 MG tablet; Take 1 tablet (500 mg total) by mouth 2 (two) times daily with a meal.  Dispense: 180 tablet; Refill: 1  3. Postoperative hypothyroidism Patient has a hx of constipation, discussed stool softeners, drinking lots of water, increasing fiber. - TSH  4. Mixed hyperlipidemia Discussed diet and lifestyle modifications with patient. Lipids were normal last year. Continue lovastatin and check next visit.   5. Caregiver stress The patient states his stress levels are ok. 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 Discussed maintaining his own health and well-being in addition to caring for his wife, making sure he is taking his medications and BP, and eating well.   6. Hemorrhoids, unspecified hemorrhoid type Take OTC stool softeners as needed, increase water and fiber intake.    Return in about 6 months (around 09/17/2022).  Student- Archer Asa O'Berry ACPCNP-S  I personally was present during the history, physical exam and medical decision-making activities of this service and have verified that the service and findings are accurately documented in the student's note Makeila Yamaguchi K. [place], [facility] [phone]

## 2022-03-18 LAB — COMPLETE METABOLIC PANEL WITH GFR
AG Ratio: 1.2 (calc) (ref 1.0–2.5)
ALT: 13 U/L (ref 9–46)
AST: 15 U/L (ref 10–35)
Albumin: 3.8 g/dL (ref 3.6–5.1)
Alkaline phosphatase (APISO): 68 U/L (ref 35–144)
BUN: 12 mg/dL (ref 7–25)
CO2: 31 mmol/L (ref 20–32)
Calcium: 9.6 mg/dL (ref 8.6–10.3)
Chloride: 104 mmol/L (ref 98–110)
Creat: 1.3 mg/dL (ref 0.70–1.35)
Globulin: 3.2 g/dL (calc) (ref 1.9–3.7)
Glucose, Bld: 165 mg/dL — ABNORMAL HIGH (ref 65–99)
Potassium: 4.6 mmol/L (ref 3.5–5.3)
Sodium: 140 mmol/L (ref 135–146)
Total Bilirubin: 0.5 mg/dL (ref 0.2–1.2)
Total Protein: 7 g/dL (ref 6.1–8.1)
eGFR: 59 mL/min/{1.73_m2} — ABNORMAL LOW (ref >=60)

## 2022-03-18 LAB — CBC WITH DIFFERENTIAL/PLATELET
Absolute Monocytes: 319 cells/uL (ref 200–950)
Basophils Absolute: 32 cells/uL (ref 0–200)
Basophils Relative: 0.9 %
Eosinophils Absolute: 130 cells/uL (ref 15–500)
Eosinophils Relative: 3.7 %
HCT: 40.7 % (ref 38.5–50.0)
Hemoglobin: 13.4 g/dL (ref 13.2–17.1)
Lymphs Abs: 1082 cells/uL (ref 850–3900)
MCH: 27.1 pg (ref 27.0–33.0)
MCHC: 32.9 g/dL (ref 32.0–36.0)
MCV: 82.2 fL (ref 80.0–100.0)
MPV: 9.6 fL (ref 7.5–12.5)
Monocytes Relative: 9.1 %
Neutro Abs: 1939 cells/uL (ref 1500–7800)
Neutrophils Relative %: 55.4 %
Platelets: 291 10*3/uL (ref 140–400)
RBC: 4.95 10*6/uL (ref 4.20–5.80)
RDW: 14.3 % (ref 11.0–15.0)
Total Lymphocyte: 30.9 %
WBC: 3.5 10*3/uL — ABNORMAL LOW (ref 3.8–10.8)

## 2022-03-18 LAB — TSH: TSH: 19.41 mIU/L — ABNORMAL HIGH (ref 0.40–4.50)

## 2022-03-18 LAB — HEMOGLOBIN A1C
Hgb A1c MFr Bld: 9.1 % of total Hgb — ABNORMAL HIGH (ref <5.7)
Mean Plasma Glucose: 214 mg/dL
eAG (mmol/L): 11.9 mmol/L

## 2022-03-18 LAB — MICROALBUMIN / CREATININE URINE RATIO
Creatinine, Urine: 164 mg/dL (ref 20–320)
Microalb Creat Ratio: 38 mcg/mg creat — ABNORMAL HIGH (ref <30)
Microalb, Ur: 6.2 mg/dL

## 2022-03-20 ENCOUNTER — Other Ambulatory Visit: Payer: Self-pay | Admitting: Nurse Practitioner

## 2022-03-20 DIAGNOSIS — E890 Postprocedural hypothyroidism: Secondary | ICD-10-CM

## 2022-03-20 DIAGNOSIS — E1122 Type 2 diabetes mellitus with diabetic chronic kidney disease: Secondary | ICD-10-CM

## 2022-04-13 ENCOUNTER — Other Ambulatory Visit: Payer: Self-pay | Admitting: Pharmacist

## 2022-04-13 NOTE — Patient Instructions (Signed)
Mitchell Gibbs,   Thank you for speaking with me today. As discussed, be on the lookout for a different prescription for your long acting insulin - when that comes through, STOP the levemir, and take lantus insulin in place of the old levemir.  If you have any questions, let us know.   Take care, Luana Shu, PharmD Clinical Pharmacist [facility]

## 2022-04-13 NOTE — Progress Notes (Signed)
Patient appearing on report for levemir usage in setting of upcoming levemir manufacturer discontinuation. Next appointment with PCP is 04/18/22   Outreached patient to discuss  glucose control and medication management.   Diabetes:  Current medications: levemir 44 units at bedtime   Diabetes Assessment/Plan: - Currently uncontrolled, adjustments made at prior PCP visit - Recommend to switch from levemir to lantus 44 units at bedtime, will collaborate with PCP   Larinda Buttery, [facility]

## 2022-04-18 ENCOUNTER — Telehealth: Payer: Self-pay | Admitting: *Deleted

## 2022-04-18 ENCOUNTER — Ambulatory Visit (INDEPENDENT_AMBULATORY_CARE_PROVIDER_SITE_OTHER): Payer: Medicare PPO | Admitting: Nurse Practitioner

## 2022-04-18 DIAGNOSIS — Z1159 Encounter for screening for other viral diseases: Secondary | ICD-10-CM | POA: Diagnosis not present

## 2022-04-18 DIAGNOSIS — Z0000 Encounter for general adult medical examination without abnormal findings: Secondary | ICD-10-CM

## 2022-04-18 DIAGNOSIS — N182 Chronic kidney disease, stage 2 (mild): Secondary | ICD-10-CM | POA: Diagnosis not present

## 2022-04-18 DIAGNOSIS — E1122 Type 2 diabetes mellitus with diabetic chronic kidney disease: Secondary | ICD-10-CM | POA: Diagnosis not present

## 2022-04-18 DIAGNOSIS — Z794 Long term (current) use of insulin: Secondary | ICD-10-CM | POA: Diagnosis not present

## 2022-04-18 MED ORDER — LANTUS SOLOSTAR 100 UNIT/ML ~~LOC~~ SOPN: 44.0000 [IU] | PEN_INJECTOR | Freq: Every day | SUBCUTANEOUS | 99 refills | Status: DC

## 2022-04-18 NOTE — Progress Notes (Signed)
  This service is provided via telemedicine  No vital signs collected/recorded due to the encounter was a telemedicine visit.   Location of patient (ex: home, work):  Home  Patient consents to a telephone visit:  Yes  Location of the provider (ex: office, home):  [place].   Name of any referring provider:  na  Names of all persons participating in the telemedicine service and their role in the encounter:  Vella Redhead, Patient, Rafael Bihari, CMA, Sherrie Mustache, NP  Time spent on call:  8:46

## 2022-04-18 NOTE — Progress Notes (Signed)
Subjective:   Mitchell Gibbs is a 72 y.o. male who presents for Medicare Annual/Subsequent preventive examination.  Review of Systems     Cardiac Risk Factors include: advanced age (>71men, >72 women);dyslipidemia;hypertension;diabetes mellitus     Objective:    There were no vitals filed for this visit. There is no height or weight on file to calculate BMI.     04/18/2022   10:41 AM 03/17/2022    8:25 AM 07/22/2021   11:43 AM 04/11/2021    8:13 AM 10/13/2020    7:32 PM 10/06/2020    9:07 AM 06/23/2020    1:12 PM  Advanced Directives  Does Patient Have a Medical Advance Directive? No No No No No No No  Would patient like information on creating a medical advance directive? No - Patient declined Yes (MAU/Ambulatory/Procedural Areas - Information given)  No - Patient declined No - Patient declined No - Patient declined Yes (MAU/Ambulatory/Procedural Areas - Information given)    Current Medications (verified) Outpatient Encounter Medications as of 04/18/2022  Medication Sig   aspirin EC 81 MG tablet Take 81 mg by mouth daily.   b complex vitamins capsule Take 1 capsule by mouth daily.   blood glucose meter kit and supplies Dispense based on patient and insurance preference. Use up to four times daily as directed. (FOR ICD-10 E10.9, E11.9).   Blood Pressure Monitoring (BLOOD PRESSURE CUFF) MISC To check blood pressure three times weekly after taking medication   glucose blood (ACCU-CHEK GUIDE) test strip USE AS DIRECTED   insulin glargine (LANTUS SOLOSTAR) 100 UNIT/ML Solostar Pen Inject 44 Units into the skin daily.   Insulin Pen Needle (B-D ULTRAFINE III SHORT PEN) 31G X 8 MM MISC Use for insulin injections. Dx: E11.22   levothyroxine (SYNTHROID) 112 MCG tablet Take 1 tablet (112 mcg total) by mouth daily before breakfast.   losartan (COZAAR) 100 MG tablet TAKE 1 TABLET BY MOUTH EVERY DAY   lovastatin (MEVACOR) 40 MG tablet TAKE 1 TABLET DAILY TO CONTROL CHOLESTEROL   metFORMIN  (GLUCOPHAGE) 500 MG tablet Take 1 tablet (500 mg total) by mouth 2 (two) times daily with a meal.   NONFORMULARY OR COMPOUNDED ITEM Antifungal solution: Terbinafine 3%, Fluconazole 2%, Tea Tree Oil 5%, Urea 10%, Ibuprofen 2% in DMSO suspension #68mL   sildenafil (VIAGRA) 100 MG tablet TAKE ONE TABLET BY MOUTH DAILY AS NEEDED FOR ERECTILE DYSFUNCTION   vitamin C (ASCORBIC ACID) 250 MG tablet Take 500 mg by mouth daily.   [DISCONTINUED] insulin detemir (LEVEMIR FLEXTOUCH) 100 UNIT/ML FlexPen Inject 44 Units into the skin at bedtime. DX E11.22 , N18.2 , Z79.4   No facility-administered encounter medications on file as of 04/18/2022.    Allergies (verified) Patient has no known allergies.   History: Past Medical History:  Diagnosis Date   Bell's palsy    2005   Carpal tunnel syndrome    Cataract    CKD stage 2 due to type 2 diabetes mellitus ([place]) 12/01/2015   Diabetes mellitus without complication (HCC)    Edema    Gout, unspecified    Hyperlipidemia    Hypertension    Internal hemorrhoids without mention of complication    Malignant neoplasm of thyroid gland (HCC)    Memory loss    Mixed disorders as reaction to stress    Obesity, unspecified    Other malaise and fatigue    Other psoriasis    Sleep apnea    no CPAP used 09-23-15   Supraventricular  premature beats    Thyroid disease    Trigger finger (acquired)    Unspecified sleep apnea    Past Surgical History:  Procedure Laterality Date   CATARACT EXTRACTION W/ INTRAOCULAR LENS IMPLANT Right 06/05/2016   Dr. Arlina Robes   COLONOSCOPY  10/07/2015   one polyp, Dr. Loletha Carrow   TOTAL THYROIDECTOMY  2007   Dr. Georgina Quint   Family History  Problem Relation Age of Onset   Cancer - Prostate Father    Stomach cancer Father    Hypertension Other    Colon cancer Neg Hx    Esophageal cancer Neg Hx    Rectal cancer Neg Hx    Social History   Socioeconomic History   Marital status: Married    Spouse name: Not on file   Number of  children: Not on file   Years of education: Not on file   Highest education level: Not on file  Occupational History   Not on file  Tobacco Use   Smoking status: Former    Types: Cigarettes    Quit date: 10/27/1978    Years since quitting: 43.5   Smokeless tobacco: Never  Vaping Use   Vaping Use: Never used  Substance and Sexual Activity   Alcohol use: Not Currently    Comment: rarely   Drug use: No   Sexual activity: Not on file  Other Topics Concern   Not on file  Social History Narrative   Not on file   Social Determinants of Health   Financial Resource Strain: Not on file  Food Insecurity: Not on file  Transportation Needs: Not on file  Physical Activity: Not on file  Stress: Not on file  Social Connections: Not on file    Tobacco Counseling Counseling given: Not Answered   Clinical Intake:  Pre-visit preparation completed: Yes  Pain : No/denies pain     BMI - recorded: 29 Nutritional Risks: None Diabetes: Yes  How often do you need to have someone help you when you read instructions, pamphlets, or other written materials from your doctor or pharmacy?: 1 - Never  Diabetic?yes         Activities of Daily Living    04/18/2022   10:51 AM  In your present state of health, do you have any difficulty performing the following activities:  Hearing? 0  Vision? 0  Difficulty concentrating or making decisions? 0  Walking or climbing stairs? 0  Dressing or bathing? 0  Doing errands, shopping? 0  Preparing Food and eating ? N  Using the Toilet? N  In the past six months, have you accidently leaked urine? N  Do you have problems with loss of bowel control? N  Managing your Medications? N  Managing your Finances? N  Housekeeping or managing your Housekeeping? N    Patient Care Team: [name], NP as PCP - General (Geriatric Medicine) Leighton Ruff, [place] (Optometry)  Indicate any recent Medical Services you may have received from other than  [facility] providers in the past year (date may be approximate).     Assessment:   This is a routine wellness examination for Mitchell Gibbs.  Hearing/Vision screen No results found.  Dietary issues and exercise activities discussed: Current Exercise Habits: Structured exercise class, Type of exercise: strength training/weights, Time (Minutes): 15, Frequency (Times/Week): 7, Weekly Exercise (Minutes/Week): 105   Goals Addressed   None    Depression Screen    04/18/2022   10:41 AM 03/17/2022    8:24 AM 10/10/2019  8:41 AM 04/04/2019    8:40 AM 11/02/2017    9:58 AM 10/24/2016    3:54 PM 07/28/2015    4:19 PM  PHQ 2/9 Scores  PHQ - 2 Score 0 0 0 0 0 0 0    Fall Risk    04/18/2022   10:41 AM 03/17/2022    8:24 AM 09/12/2021    1:42 PM 04/11/2021    8:33 AM 10/06/2020    9:05 AM  Fall Risk   Falls in the past year? 0 0 0 0 0  Number falls in past yr: 0 0 0 0 0  Injury with Fall? 0 0 0 0 0  Risk for fall due to :  No Fall Risks No Fall Risks No Fall Risks No Fall Risks  Follow up  Falls evaluation completed  Falls evaluation completed Falls evaluation completed    FALL RISK PREVENTION PERTAINING TO THE HOME:  Any stairs in or around the home? No  If so, are there any without handrails?  na Home free of loose throw rugs in walkways, pet beds, electrical cords, etc? Yes  Adequate lighting in your home to reduce risk of falls? Yes   ASSISTIVE DEVICES UTILIZED TO PREVENT FALLS:  Life alert? No  Use of a cane, walker or w/c? No  Grab bars in the bathroom? No  Shower chair or bench in shower? No  Elevated toilet seat or a handicapped toilet? No   TIMED UP AND GO:  Was the test performed? No .    Cognitive Function:    12/29/2014    4:22 PM  MMSE - Mini Mental State Exam  Orientation to time 5  Orientation to Place 5  Registration 3  Attention/ Calculation 5  Recall 1  Language- name 2 objects 2  Language- repeat 1  Language- follow 3 step command 3  Language- read & follow  direction 1  Write a sentence 1  Copy design 1  Total score 28        04/18/2022   10:41 AM  6CIT Screen  What Year? 0 points  What month? 0 points  What time? 0 points  Count back from 20 0 points  Months in reverse 0 points  Repeat phrase 0 points  Total Score 0 points    Immunizations Immunization History  Administered Date(s) Administered   Fluad Quad(high Dose 65+) 09/30/2018, 10/06/2019, 10/06/2020, 12/02/2021   Influenza-Unspecified 09/16/2012, 09/17/2014, 09/17/2015, 10/17/2016, 10/19/2017   PFIZER Comirnaty(Gray Top)Covid-19 Tri-Sucrose Vaccine 07/07/2020   PFIZER(Purple Top)SARS-COV-2 Vaccination 02/04/2019, 02/25/2019, 10/15/2019   Pfizer Covid-19 Vaccine Bivalent Booster 33yrs & up 10/20/2020   Pneumococcal Conjugate-13 05/12/2014, 03/18/2018   Pneumococcal Polysaccharide-23 10/29/2001, 04/04/2019   Tdap 03/06/2011, 07/22/2021    TDAP status: Up to date  Flu Vaccine status: Up to date  Pneumococcal vaccine status: Up to date  Covid-19 vaccine status: Information provided on how to obtain vaccines.   Qualifies for Shingles Vaccine? Yes   Zostavax completed No   Shingrix Completed?: No.    Education has been provided regarding the importance of this vaccine. Patient has been advised to call insurance company to determine out of pocket expense if they have not yet received this vaccine. Advised may also receive vaccine at local pharmacy or Health Dept. Verbalized acceptance and understanding.  Screening Tests Health Maintenance  Topic Date Due   Hepatitis C Screening  Never done   Zoster Vaccines- Shingrix (1 of 2) Never done   COVID-19 Vaccine (6 - 2023-24 season)  09/16/2021   INFLUENZA VACCINE  08/17/2022   HEMOGLOBIN A1C  09/17/2022   OPHTHALMOLOGY EXAM  12/29/2022   FOOT EXAM  02/14/2023   Diabetic kidney evaluation - eGFR measurement  03/17/2023   Diabetic kidney evaluation - Urine ACR  03/17/2023   Medicare Annual Wellness (AWV)  04/18/2023    COLONOSCOPY (Pts 45-70yrs Insurance coverage will need to be confirmed)  10/06/2025   DTaP/Tdap/Td (3 - Td or Tdap) 07/23/2031   Pneumonia Vaccine 62+ Years old  Completed   HPV VACCINES  Aged Out    Health Maintenance  Health Maintenance Due  Topic Date Due   Hepatitis C Screening  Never done   Zoster Vaccines- Shingrix (1 of 2) Never done   COVID-19 Vaccine (6 - 2023-24 season) 09/16/2021    Colorectal cancer screening: Type of screening: Colonoscopy. Completed 2017. Repeat every 10 years  Lung Cancer Screening: (Low Dose CT Chest recommended if Age 38-80 years, 30 pack-year currently smoking OR have quit w/in 15years.) does not qualify.   Lung Cancer Screening Referral: na  Additional Screening:  Hepatitis C Screening: does qualify; Completd with next lab  Vision Screening: Recommended annual ophthalmology exams for early detection of glaucoma and other disorders of the eye. Is the patient up to date with their annual eye exam?  yes Who is the provider or what is the name of the office in which the patient attends annual eye exams? [facility] ophthalmology If pt is not established with a provider, would they like to be referred to a provider to establish care? No .   Dental Screening: Recommended annual dental exams for proper oral hygiene  Community Resource Referral / Chronic Care Management: CRR required this visit?  No   CCM required this visit?  No      Plan:     I have personally reviewed and noted the following in the patient's chart:   Medical and social history Use of alcohol, tobacco or illicit drugs  Current medications and supplements including opioid prescriptions. Patient is not currently taking opioid prescriptions. Functional ability and status Nutritional status Physical activity Advanced directives List of other physicians Hospitalizations, surgeries, and ER visits in previous 12 months Vitals Screenings to include cognitive, depression, and  falls Referrals and appointments  In addition, I have reviewed and discussed with patient certain preventive protocols, quality metrics, and best practice recommendations. A written personalized care plan for preventive services as well as general preventive health recommendations were provided to patient.     [name], NP   04/18/2022   Virtual Visit via Video Note  I connected with Mitchell Gibbs on 04/18/22 at 10:40 AM EDT by a video enabled telemedicine application and verified that I am speaking with the correct person using two identifiers.  Location: Patient: home Provider: [facility]   I discussed the limitations of evaluation and management by telemedicine and the availability of in person appointments. The patient expressed understanding and agreed to proceed.    I discussed the assessment and treatment plan with the patient. The patient was provided an opportunity to ask questions and all were answered. The patient agreed with the plan and demonstrated an understanding of the instructions.   The patient was advised to call back or seek an in-person evaluation if the symptoms worsen or if the condition fails to improve as anticipated.  I provided 16 minutes of non-face-to-face time during this encounter.  Mitchell Gibbs. Dewaine Oats, AGNP Avs printed and mailed.

## 2022-04-18 NOTE — Telephone Encounter (Signed)
Mr. roshawn, barbin are scheduled for a virtual visit with your provider today.    Just as we do with appointments in the office, we must obtain your consent to participate.  Your consent will be active for this visit and any virtual visit you Berklee Battey have with one of our providers in the next 365 days.    If you have a MyChart account, I can also send a copy of this consent to you electronically.  All virtual visits are billed to your insurance company just like a traditional visit in the office.  As this is a virtual visit, video technology does not allow for your provider to perform a traditional examination.  This [place]a Bo[place] limit your provider's ability to fully assess your condition.  If your provider identifies any concerns that need to be evaluated in person or the need to arrange testing such as labs, EKG, etc, we will make arrangements to do so.    Although advances in technology are sophisticated, we cannot ensure that it will always work on either your end or our end.  If the connection with a video visit is poor, we Adena Sima have to switch to a telephone visit.  With either a video or telephone visit, we are not always able to ensure that we have a secure connection.   I need to obtain your verbal consent now.   Are you willing to proceed with your visit today?   Bretta Bang has provided verbal consent on 04/18/2022 for a virtual visit (video or telephone).   MayAlbertina Sen[place], [place] 04/18/2022  10:46 AM

## 2022-04-26 ENCOUNTER — Other Ambulatory Visit: Payer: Self-pay | Admitting: Nurse Practitioner

## 2022-04-26 DIAGNOSIS — E890 Postprocedural hypothyroidism: Secondary | ICD-10-CM

## 2022-04-28 ENCOUNTER — Other Ambulatory Visit: Payer: Self-pay | Admitting: Pharmacist

## 2022-04-28 NOTE — Progress Notes (Signed)
04/28/2022 Name: Mitchell Gibbs MRN: [id number] DOB: [birth year]   Mitchell Gibbs is a 72 y.o. year old male who presented for a telephone visit.   They were referred to the pharmacist by a quality report for assistance in managing  quality metrics: med adherence cholesterol (MAC), and med adherence hypertension [facility]) .   Of note, patient was without levothyroxine, recent TSH uptrend. Inquired about this medication as well, chart shows RX received by dispensing pharmacy ([facility]) on 04/26/22 and patient states he will pickup when they notify it is ready.  Subjective:  Care Team: Primary Care Provider: Sharon Seller, NP   Medication Access/Adherence  Current Pharmacy:  [id number] [place], [place] - [phone] [place] [address] Phone: [phone] Fax: [phone]  [id number] [place], [place] - [address] [place] [address] Phone: [phone] Fax: [phone]   Patient reports affordability concerns with their medications: No  Patient reports access/transportation concerns to their pharmacy: No  Patient reports adherence concerns with their medications:  No     Hypertension:  Current medications: losartan  daily   Hyperlipidemia/ASCVD Risk Reduction  Current lipid lowering medications: lovastatin  daily   Objective:  Lab Results  Component Value Date   HGBA1C 9.1 (H) 03/17/2022    Lab Results  Component Value Date   CREATININE 1.30 03/17/2022   BUN 12 03/17/2022   NA 140 03/17/2022   K 4.6 03/17/2022   CL 104 03/17/2022   CO2 31 03/17/2022    Lab Results  Component Value Date   CHOL 165 09/12/2021   HDL 78 09/12/2021   LDLCALC 73 09/12/2021   TRIG 56 09/12/2021   CHOLHDL 2.1 09/12/2021    Medications Reviewed Today     Reviewed by Gabriel Carina, RPH (Pharmacist) on 04/28/22 at 1143  Med List Status: <None>   Medication Order Taking? Sig Documenting  Provider Last Dose Status Informant  aspirin EC 81 MG tablet [id number]  Take 81 mg by mouth daily. [provider]  Active   b complex vitamins capsule [id number]  Take 1 capsule by mouth daily. [provider]  Active   blood glucose meter kit and supplies [id number]  Dispense based on patient and insurance preference. Use up to four times daily as directed. (FOR ICD-10 E10.9, E11.9). Sharon Seller, NP  Active   Blood Pressure Monitoring (BLOOD PRESSURE CUFF) MISC [id number]  To check blood pressure three times weekly after taking medication Sharon Seller, NP  Active   glucose blood (ACCU-CHEK GUIDE) test strip [id number]  USE AS DIRECTED Sharon Seller, NP  Active   insulin glargine (LANTUS SOLOSTAR) 100 UNIT/ML Solostar Pen [id number]  Inject 44 Units into the skin daily. Sharon Seller, NP  Active   Insulin Pen Needle (B-D ULTRAFINE III SHORT PEN) 31G X 8 MM MISC [id number]  Use for insulin injections. Dx: E11.22 Sharon Seller, NP  Active   levothyroxine (SYNTHROID) 112 MCG tablet [id number]  TAKE 1 TABLET BY MOUTH DAILY BEFORE BREAKFAST. Sharon Seller, NP  Active   losartan (COZAAR) 100 MG tablet [id number] Yes TAKE 1 TABLET BY MOUTH EVERY DAY Sharon Seller, NP Taking Active   lovastatin (MEVACOR) 40 MG tablet [id number] Yes TAKE 1 TABLET DAILY TO CONTROL CHOLESTEROL Sharon Seller, NP Taking Active   metFORMIN (GLUCOPHAGE) 500 MG tablet [id number]  Take 1 tablet (500 mg total) by mouth 2 (two) times  daily with a meal. Sharon Seller, NP  Active   NONFORMULARY OR COMPOUNDED ITEM [id number]  Antifungal solution: Terbinafine 3%, Fluconazole 2%, Tea Tree Oil 5%, Urea 10%, Ibuprofen 2% in DMSO suspension #34mL Freddie Breech, DPM  Active   sildenafil (VIAGRA) 100 MG tablet [id number]  TAKE ONE TABLET BY MOUTH DAILY AS NEEDED FOR ERECTILE DYSFUNCTION Sharon Seller, NP  Active   vitamin C (ASCORBIC ACID) 250 MG tablet [id number]  Take 500 mg by  mouth daily. [provider]  Active               Assessment/Plan:   Hypertension: - Currently controlled, Reviewed fill history and confirmed usage with patient, no concerns identified at this time - Recommend to continue current regimen    Hyperlipidemia/ASCVD Risk Reduction: - Currently controlled, Reviewed fill history and confirmed usage with patient, no concerns identified at this time  - Encouraged consistent use of medication - Recommend to continue current medication    Lynnda Shields, PharmD, BCPS Clinical Pharmacist [facility]

## 2022-04-28 NOTE — Patient Instructions (Addendum)
Mitchell Gibbs,  Thank you for speaking with me today! As discussed, pick up your levothyroxine at the pharmacy when they notify it is ready. Also be sure to take all other medications consistently. Let us know if you need anything, we are here to support you.  Take care and have a great weekend!  Elmarie Shiley, PharmD, BCPS Clinical Pharmacist [facility]

## 2022-05-23 ENCOUNTER — Ambulatory Visit: Payer: Medicare PPO | Admitting: Podiatry

## 2022-05-23 ENCOUNTER — Encounter: Payer: Self-pay | Admitting: Podiatry

## 2022-05-23 DIAGNOSIS — N182 Chronic kidney disease, stage 2 (mild): Secondary | ICD-10-CM

## 2022-05-23 DIAGNOSIS — M79674 Pain in right toe(s): Secondary | ICD-10-CM

## 2022-05-23 DIAGNOSIS — B353 Tinea pedis: Secondary | ICD-10-CM

## 2022-05-23 DIAGNOSIS — E1122 Type 2 diabetes mellitus with diabetic chronic kidney disease: Secondary | ICD-10-CM | POA: Diagnosis not present

## 2022-05-23 DIAGNOSIS — B351 Tinea unguium: Secondary | ICD-10-CM | POA: Diagnosis not present

## 2022-05-23 DIAGNOSIS — L84 Corns and callosities: Secondary | ICD-10-CM

## 2022-05-23 DIAGNOSIS — M79675 Pain in left toe(s): Secondary | ICD-10-CM | POA: Diagnosis not present

## 2022-05-23 DIAGNOSIS — Z794 Long term (current) use of insulin: Secondary | ICD-10-CM | POA: Diagnosis not present

## 2022-05-23 MED ORDER — KETOCONAZOLE 2 % EX CREA: TOPICAL_CREAM | CUTANEOUS | 1 refills | Status: DC

## 2022-05-23 NOTE — Progress Notes (Signed)
  Subjective:  Patient ID: Mitchell Gibbs, male    DOB: [birth year],  MRN: [id number]  [employer] presents to clinic today for:  Chief Complaint  Patient presents with   Diabetes    [facility] BS - DIDN'T TAKE IT THIS MORNING, YESTERDAY MORNING 105 A1C - 9.1 LVPCP - 03/17/22   Callouses    POSSIBLE CALLUS BOTTOM OF LEFT FOOT  .  PCP is Sharon Seller, NP.  No Known Allergies  Review of Systems: Negative except as noted in the HPI.  Objective: No changes noted in today's physical examination. There were no vitals filed for this visit.  Mitchell Gibbs is a pleasant 72 y.o. male in NAD. AAO x 3.  Vascular Examination: Capillary refill time <3 seconds b/l LE. Palpable pedal pulses b/l LE. Digital hair sparse b/l. No pedal edema b/l. Skin temperature gradient WNL b/l. No varicosities b/l. No cyanosis or clubbing noted b/l LE.Marland Kitchen  Dermatological Examination: Pedal skin with normal turgor, texture and tone b/l. No open wounds. No interdigital macerations b/l. Toenails 1-5 b/l thickened, discolored, dystrophic with subungual debris. There is pain on palpation to dorsal aspect of nailplates. Hyperkeratotic lesion(s) submet head 3 left foot and submet head 5 right foot.  No erythema, no edema, no drainage, no fluctuance. Diffuse scaling noted peripherally and plantarly b/l feet.  No interdigital macerations.  No blisters, no weeping. No signs of secondary bacterial infection noted.  Neurological Examination: Protective sensation intact with 10 gram monofilament b/l LE. Vibratory sensation intact b/l LE.   Musculoskeletal Examination: Muscle strength 5/5 to all LE muscle groups b/l. Hallux hammertoe noted b/l LE. Hammertoe deformity noted 2-5 b/l.     Latest Ref Rng & Units 03/17/2022    9:11 AM 09/12/2021    2:38 PM  Hemoglobin A1C  Hemoglobin-A1c <5.7 % of total Hgb 9.1  6.8    Assessment/Plan: 1. Pain due to onychomycosis of toenails of both feet   2. Callus   3. Tinea pedis of  left foot   4. Type 2 diabetes mellitus with stage 2 chronic kidney disease, with long-term current use of insulin (HCC)     Meds ordered this encounter  Medications   ketoconazole (NIZORAL) 2 % cream    Sig: Apply to both feet and between toes once daily for 6 weeks.    Dispense:  60 g    Refill:  1   -Patient was evaluated and treated. All patient's and/or POA's questions/concerns answered on today's visit. -Patient to continue soft, supportive shoe gear daily. -Mycotic toenails 1-5 bilaterally were debrided in length and girth with sterile nail nippers and dremel without incident. -Callus(es) submet head 3 left foot and submet head 5 right foot pared utilizing sharp debridement with sterile blade without complication or incident. Total number debrided =2. -For tinea pedis, Rx sent to pharmacy for Ketoconazole Cream 2% to be applied once daily for six weeks. -Patient/POA to call should there be question/concern in the interim.   Return in about 3 months (around 08/23/2022).  Mitchell Gibbs, DPM

## 2022-06-16 ENCOUNTER — Other Ambulatory Visit: Payer: Self-pay

## 2022-06-16 DIAGNOSIS — E1122 Type 2 diabetes mellitus with diabetic chronic kidney disease: Secondary | ICD-10-CM

## 2022-06-16 DIAGNOSIS — E890 Postprocedural hypothyroidism: Secondary | ICD-10-CM

## 2022-06-16 DIAGNOSIS — Z1159 Encounter for screening for other viral diseases: Secondary | ICD-10-CM

## 2022-06-20 ENCOUNTER — Other Ambulatory Visit: Payer: Medicare PPO

## 2022-06-20 DIAGNOSIS — Z1159 Encounter for screening for other viral diseases: Secondary | ICD-10-CM | POA: Diagnosis not present

## 2022-06-20 DIAGNOSIS — E1122 Type 2 diabetes mellitus with diabetic chronic kidney disease: Secondary | ICD-10-CM | POA: Diagnosis not present

## 2022-06-20 DIAGNOSIS — E890 Postprocedural hypothyroidism: Secondary | ICD-10-CM | POA: Diagnosis not present

## 2022-06-20 DIAGNOSIS — N182 Chronic kidney disease, stage 2 (mild): Secondary | ICD-10-CM | POA: Diagnosis not present

## 2022-06-20 DIAGNOSIS — Z794 Long term (current) use of insulin: Secondary | ICD-10-CM | POA: Diagnosis not present

## 2022-06-21 ENCOUNTER — Other Ambulatory Visit: Payer: Medicare PPO

## 2022-06-21 LAB — HEMOGLOBIN A1C
Hgb A1c MFr Bld: 7.6 % of total Hgb — ABNORMAL HIGH (ref <5.7)
Mean Plasma Glucose: 171 mg/dL
eAG (mmol/L): 9.5 mmol/L

## 2022-06-21 LAB — HEPATITIS C ANTIBODY: Hepatitis C Ab: NONREACTIVE

## 2022-06-21 LAB — TSH: TSH: 3.13 mIU/L (ref 0.40–4.50)

## 2022-06-23 ENCOUNTER — Encounter: Payer: Self-pay | Admitting: Nurse Practitioner

## 2022-06-23 ENCOUNTER — Ambulatory Visit: Payer: Medicare PPO | Admitting: Nurse Practitioner

## 2022-06-23 VITALS — BP 140/90 | HR 59 | Temp 97.1°F | Resp 16 | Ht 72.0 in | Wt 220.2 lb

## 2022-06-23 DIAGNOSIS — E782 Mixed hyperlipidemia: Secondary | ICD-10-CM

## 2022-06-23 DIAGNOSIS — E1122 Type 2 diabetes mellitus with diabetic chronic kidney disease: Secondary | ICD-10-CM

## 2022-06-23 DIAGNOSIS — Z794 Long term (current) use of insulin: Secondary | ICD-10-CM | POA: Diagnosis not present

## 2022-06-23 DIAGNOSIS — N182 Chronic kidney disease, stage 2 (mild): Secondary | ICD-10-CM

## 2022-06-23 DIAGNOSIS — I10 Essential (primary) hypertension: Secondary | ICD-10-CM | POA: Diagnosis not present

## 2022-06-23 MED ORDER — LANTUS SOLOSTAR 100 UNIT/ML ~~LOC~~ SOPN
35.0000 [IU] | PEN_INJECTOR | Freq: Every day | SUBCUTANEOUS | 99 refills | Status: DC
Start: 2022-06-23 — End: 2022-10-02

## 2022-06-23 NOTE — Progress Notes (Signed)
Careteam: Patient Care Team: Sharon Seller, NP as PCP - General (Geriatric Medicine) Herschel Senegal, OD (Optometry)  PLACE OF SERVICE:  [facility]  Advanced Directive information Does Patient Have a Medical Advance Directive?: No, Would patient like information on creating a medical advance directive?: No - Patient declined  No Known Allergies  Chief Complaint  Patient presents with   Medical Management of Chronic Issues    3 month follow up.    Immunizations    Discuss the need for Shingrix vaccine, and Covid Booster.      HPI: Patient is a 72 y.o. male for routine follow up  Does not check blood pressure at home- tried to order bp cuff to take at home but they only sent him the cuff and not the entire machine On losartan 100 mg daily  DM- A1c has improved to 7.6, he still had levemir so finishing this.  He has not had any low blood sugars. Reports reading up and down.  Did not check this morning.  He is not taking 44 units - only taking 30 units at this time It was working in the past but now now.   Hypothyroid- TSH now at goal on synthroid 112 mcg  Review of Systems:  Review of Systems  Constitutional:  Negative for chills, fever and weight loss.  HENT:  Negative for tinnitus.   Respiratory:  Negative for cough, sputum production and shortness of breath.   Cardiovascular:  Negative for chest pain, palpitations and leg swelling.  Gastrointestinal:  Negative for abdominal pain, constipation, diarrhea and heartburn.  Genitourinary:  Negative for dysuria, frequency and urgency.  Musculoskeletal:  Negative for back pain, falls, joint pain and myalgias.  Skin: Negative.   Neurological:  Negative for dizziness and headaches.  Psychiatric/Behavioral:  Negative for depression and memory loss. The patient does not have insomnia.     Past Medical History:  Diagnosis Date   Bell's palsy    2005   Carpal tunnel syndrome    Cataract    CKD stage 2 due to type 2  diabetes mellitus (HCC) 12/01/2015   Diabetes mellitus without complication (HCC)    Edema    Gout, unspecified    Hyperlipidemia    Hypertension    Internal hemorrhoids without mention of complication    Malignant neoplasm of thyroid gland (HCC)    Memory loss    Mixed disorders as reaction to stress    Obesity, unspecified    Other malaise and fatigue    Other psoriasis    Sleep apnea    no CPAP used 09-23-15   Supraventricular premature beats    Thyroid disease    Trigger finger (acquired)    Unspecified sleep apnea    Past Surgical History:  Procedure Laterality Date   CATARACT EXTRACTION W/ INTRAOCULAR LENS IMPLANT Right 06/05/2016   Dr. Edrick Oh   COLONOSCOPY  10/07/2015   one polyp, Dr. Myrtie Neither   TOTAL THYROIDECTOMY  2007   Dr. Lebron Conners   Social History:   reports that he quit smoking about 43 years ago. His smoking use included cigarettes. He has never used smokeless tobacco. He reports that he does not currently use alcohol. He reports that he does not use drugs.  Family History  Problem Relation Age of Onset   Cancer - Prostate Father    Stomach cancer Father    Hypertension Other    Colon cancer Neg Hx    Esophageal cancer Neg Hx  Rectal cancer Neg Hx     Medications: Patient's Medications  New Prescriptions   No medications on file  Previous Medications   ASPIRIN EC 81 MG TABLET    Take 81 mg by mouth daily.   B COMPLEX VITAMINS CAPSULE    Take 1 capsule by mouth daily.   BLOOD GLUCOSE METER KIT AND SUPPLIES    Dispense based on patient and insurance preference. Use up to four times daily as directed. (FOR ICD-10 E10.9, E11.9).   BLOOD PRESSURE MONITORING (BLOOD PRESSURE CUFF) MISC    To check blood pressure three times weekly after taking medication   GLUCOSE BLOOD (ACCU-CHEK GUIDE) TEST STRIP    USE AS DIRECTED   INSULIN GLARGINE (LANTUS SOLOSTAR) 100 UNIT/ML SOLOSTAR PEN    Inject 44 Units into the skin daily.   INSULIN PEN NEEDLE (B-D ULTRAFINE III  SHORT PEN) 31G X 8 MM MISC    Use for insulin injections. Dx: E11.22   KETOCONAZOLE (NIZORAL) 2 % CREAM    Apply to both feet and between toes once daily for 6 weeks.   LEVOTHYROXINE (SYNTHROID) 112 MCG TABLET    TAKE 1 TABLET BY MOUTH DAILY BEFORE BREAKFAST.   LOSARTAN (COZAAR) 100 MG TABLET    TAKE 1 TABLET BY MOUTH EVERY DAY   LOVASTATIN (MEVACOR) 40 MG TABLET    TAKE 1 TABLET DAILY TO CONTROL CHOLESTEROL   METFORMIN (GLUCOPHAGE) 500 MG TABLET    Take 1 tablet (500 mg total) by mouth 2 (two) times daily with a meal.   NONFORMULARY OR COMPOUNDED ITEM    Antifungal solution: Terbinafine 3%, Fluconazole 2%, Tea Tree Oil 5%, Urea 10%, Ibuprofen 2% in DMSO suspension #26mL   SILDENAFIL (VIAGRA) 100 MG TABLET    TAKE ONE TABLET BY MOUTH DAILY AS NEEDED FOR ERECTILE DYSFUNCTION   VITAMIN C (ASCORBIC ACID) 250 MG TABLET    Take 500 mg by mouth daily.  Modified Medications   No medications on file  Discontinued Medications   No medications on file    Physical Exam:  Vitals:   06/23/22 1003 06/23/22 1004  BP: (!) 154/94 (!) 140/90  Pulse: (!) 59   Resp: 16   Temp: (!) 97.1 F (36.2 C)   SpO2: 96%   Weight: 220 lb 3.2 oz (99.9 kg)   Height: 6' (1.829 m)    Body mass index is 29.86 kg/m. Wt Readings from Last 3 Encounters:  06/23/22 220 lb 3.2 oz (99.9 kg)  03/17/22 217 lb (98.4 kg)  09/12/21 214 lb 12.8 oz (97.4 kg)    Physical Exam Constitutional:      General: He is not in acute distress.    Appearance: He is well-developed. He is not diaphoretic.  HENT:     Head: Normocephalic and atraumatic.     Right Ear: External ear normal.     Left Ear: External ear normal.     Mouth/Throat:     Pharynx: No oropharyngeal exudate.  Eyes:     Conjunctiva/sclera: Conjunctivae normal.     Pupils: Pupils are equal, round, and reactive to light.  Cardiovascular:     Rate and Rhythm: Normal rate and regular rhythm.     Heart sounds: Normal heart sounds.  Pulmonary:     Effort: Pulmonary  effort is normal.     Breath sounds: Normal breath sounds.  Abdominal:     General: Bowel sounds are normal.     Palpations: Abdomen is soft.  Musculoskeletal:  General: No tenderness.     Cervical back: Normal range of motion and neck supple.     Right lower leg: No edema.     Left lower leg: No edema.  Skin:    General: Skin is warm and dry.  Neurological:     Mental Status: He is alert and oriented to person, place, and time.     Labs reviewed: Basic Metabolic Panel: Recent Labs    09/12/21 1438 03/17/22 0911 06/20/22 1011  NA 141 140  --   K 4.6 4.6  --   CL 105 104  --   CO2 29 31  --   GLUCOSE 85 165*  --   BUN 14 12  --   CREATININE 1.37* 1.30  --   CALCIUM 9.8 9.6  --   TSH  --  19.41* 3.13   Liver Function Tests: Recent Labs    09/12/21 1438 03/17/22 0911  AST 14 15  ALT 11 13  BILITOT 0.4 0.5  PROT 7.2 7.0   No results for input(s): "LIPASE", "AMYLASE" in the last 8760 hours. No results for input(s): "AMMONIA" in the last 8760 hours. CBC: Recent Labs    09/12/21 1438 03/17/22 0911  WBC 3.8 3.5*  NEUTROABS 2,075 1,939  HGB 13.3 13.4  HCT 41.0 40.7  MCV 85.4 82.2  PLT 225 291   Lipid Panel: Recent Labs    09/12/21 1438  CHOL 165  HDL 78  LDLCALC 73  TRIG 56  CHOLHDL 2.1   TSH: Recent Labs    03/17/22 0911 06/20/22 1011  TSH 19.41* 3.13   A1C: Lab Results  Component Value Date   HGBA1C 7.6 (H) 06/20/2022     Assessment/Plan 1. Type 2 diabetes mellitus with stage 2 chronic kidney disease, with long-term current use of insulin (HCC) He reports only taking 30 units of levemir at this time and not exercising. Thought this would be enough. Will increase to 35 units daily Encouraged to increase physical activity -Encouraged dietary compliance, routine foot care/monitoring and to keep up with diabetic eye exams through ophthalmology  - Hemoglobin A1c; Future - insulin glargine (LANTUS SOLOSTAR) 100 UNIT/ML Solostar Pen;  Inject 35 Units into the skin at bedtime.  Dispense: 15 mL; Refill: PRN  2. Essential hypertension -will monitor BP at home, plans to go buy bp cuff.  Also discussed making dietary changes If remains elevated will add medication  - COMPLETE METABOLIC PANEL WITH GFR; Future - CBC with Differential/Platelet; Future  3. Mixed hyperlipidemia -will follow up lipids prior to next appt. Continues lovastatin - Lipid panel; Future - COMPLETE METABOLIC PANEL WITH GFR; Future  4. Hypothyroid  TSH now at goal. Continue current dose of synthroid  Return in about 3 months (around 09/23/2022) for follow up, labs prior to visit .  Janene Harvey. [facility] [phone]

## 2022-06-23 NOTE — Patient Instructions (Signed)
Increase levemir/lantus to 35 units daily at bedtime.

## 2022-08-23 ENCOUNTER — Ambulatory Visit: Payer: Medicare PPO | Admitting: Podiatry

## 2022-08-23 ENCOUNTER — Encounter: Payer: Self-pay | Admitting: Podiatry

## 2022-08-23 DIAGNOSIS — M79674 Pain in right toe(s): Secondary | ICD-10-CM

## 2022-08-23 DIAGNOSIS — E1122 Type 2 diabetes mellitus with diabetic chronic kidney disease: Secondary | ICD-10-CM | POA: Diagnosis not present

## 2022-08-23 DIAGNOSIS — N182 Chronic kidney disease, stage 2 (mild): Secondary | ICD-10-CM

## 2022-08-23 DIAGNOSIS — B351 Tinea unguium: Secondary | ICD-10-CM | POA: Diagnosis not present

## 2022-08-23 DIAGNOSIS — Z794 Long term (current) use of insulin: Secondary | ICD-10-CM | POA: Diagnosis not present

## 2022-08-23 DIAGNOSIS — M79675 Pain in left toe(s): Secondary | ICD-10-CM

## 2022-08-23 NOTE — Progress Notes (Signed)
  Subjective:  Patient ID: Mitchell Gibbs, male    DOB: 09-Dec-1950,   MRN: [id number]  Chief Complaint  Patient presents with   Nail Problem    3 mth rfc    72 y.o. male presents for concern of thickened elongated and painful nails that are difficult to trim. Requesting to have them trimmed today. Relates burning and tingling in their feet. Patient is diabetic and last A1c was  Lab Results  Component Value Date   HGBA1C 7.6 (H) 06/20/2022   .   PCP:  Sharon Seller, NP    . Denies any other pedal complaints. Denies n/v/f/c.   Past Medical History:  Diagnosis Date   Bell's palsy    2005   Carpal tunnel syndrome    Cataract    CKD stage 2 due to type 2 diabetes mellitus (HCC) 12/01/2015   Diabetes mellitus without complication (HCC)    Edema    Gout, unspecified    Hyperlipidemia    Hypertension    Internal hemorrhoids without mention of complication    Malignant neoplasm of thyroid gland (HCC)    Memory loss    Mixed disorders as reaction to stress    Obesity, unspecified    Other malaise and fatigue    Other psoriasis    Sleep apnea    no CPAP used 09-23-15   Supraventricular premature beats    Thyroid disease    Trigger finger (acquired)    Unspecified sleep apnea     Objective:  Physical Exam: Vascular: DP/PT pulses 2/4 bilateral. CFT <3 seconds. Absent hair growth on digits. Edema noted to bilateral lower extremities. Xerosis noted bilaterally.  Skin. No lacerations or abrasions bilateral feet. Nails 1-5 bilateral  are thickened discolored and elongated with subungual debris.  Musculoskeletal: MMT 5/5 bilateral lower extremities in DF, PF, Inversion and Eversion. Deceased ROM in DF of ankle joint.  Neurological: Sensation intact to light touch. Protective sensation diminished bilateral.      Assessment:   1. Pain due to onychomycosis of toenails of both feet   2. Type 2 diabetes mellitus with stage 2 chronic kidney disease, with long-term current use of  insulin (HCC)      Plan:  Patient was evaluated and treated and all questions answered. -Discussed and educated patient on diabetic foot care, especially with  regards to the vascular, neurological and musculoskeletal systems.  -Stressed the importance of good glycemic control and the detriment of not  controlling glucose levels in relation to the foot. -Discussed supportive shoes at all times and checking feet regularly.  -Mechanically debrided all nails 1-5 bilateral using sterile nail nipper and filed with dremel without incident  -Answered all patient questions -Patient to return  in 3 months for at risk foot care -Patient advised to call the office if any problems or questions arise in the meantime.   Louann Sjogren, DPM

## 2022-08-31 ENCOUNTER — Other Ambulatory Visit: Payer: Self-pay

## 2022-08-31 DIAGNOSIS — E782 Mixed hyperlipidemia: Secondary | ICD-10-CM

## 2022-08-31 DIAGNOSIS — I10 Essential (primary) hypertension: Secondary | ICD-10-CM

## 2022-08-31 DIAGNOSIS — E1122 Type 2 diabetes mellitus with diabetic chronic kidney disease: Secondary | ICD-10-CM

## 2022-09-21 ENCOUNTER — Other Ambulatory Visit: Payer: Self-pay | Admitting: Podiatry

## 2022-09-21 DIAGNOSIS — B353 Tinea pedis: Secondary | ICD-10-CM

## 2022-09-22 ENCOUNTER — Encounter: Payer: Self-pay | Admitting: Pharmacist

## 2022-09-27 ENCOUNTER — Other Ambulatory Visit: Payer: Self-pay | Admitting: Nurse Practitioner

## 2022-09-27 ENCOUNTER — Other Ambulatory Visit: Payer: Medicare PPO

## 2022-09-27 DIAGNOSIS — Z794 Long term (current) use of insulin: Secondary | ICD-10-CM | POA: Diagnosis not present

## 2022-09-27 DIAGNOSIS — E1122 Type 2 diabetes mellitus with diabetic chronic kidney disease: Secondary | ICD-10-CM | POA: Diagnosis not present

## 2022-09-27 DIAGNOSIS — E890 Postprocedural hypothyroidism: Secondary | ICD-10-CM

## 2022-09-27 DIAGNOSIS — I10 Essential (primary) hypertension: Secondary | ICD-10-CM | POA: Diagnosis not present

## 2022-09-27 DIAGNOSIS — N182 Chronic kidney disease, stage 2 (mild): Secondary | ICD-10-CM | POA: Diagnosis not present

## 2022-09-27 DIAGNOSIS — E782 Mixed hyperlipidemia: Secondary | ICD-10-CM | POA: Diagnosis not present

## 2022-09-28 LAB — COMPLETE METABOLIC PANEL WITH GFR
AG Ratio: 1.2 (calc) (ref 1.0–2.5)
ALT: 12 U/L (ref 9–46)
AST: 16 U/L (ref 10–35)
Albumin: 4 g/dL (ref 3.6–5.1)
Alkaline phosphatase (APISO): 64 U/L (ref 35–144)
BUN/Creatinine Ratio: 13 (calc) (ref 6–22)
BUN: 18 mg/dL (ref 7–25)
CO2: 28 mmol/L (ref 20–32)
Calcium: 9.6 mg/dL (ref 8.6–10.3)
Chloride: 105 mmol/L (ref 98–110)
Creat: 1.38 mg/dL — ABNORMAL HIGH (ref 0.70–1.28)
Globulin: 3.3 g/dL (ref 1.9–3.7)
Glucose, Bld: 137 mg/dL — ABNORMAL HIGH (ref 65–99)
Potassium: 4.4 mmol/L (ref 3.5–5.3)
Sodium: 139 mmol/L (ref 135–146)
Total Bilirubin: 0.4 mg/dL (ref 0.2–1.2)
Total Protein: 7.3 g/dL (ref 6.1–8.1)
eGFR: 55 mL/min/{1.73_m2} — ABNORMAL LOW (ref >=60)

## 2022-09-28 LAB — CBC WITH DIFFERENTIAL/PLATELET
Absolute Monocytes: 323 {cells}/uL (ref 200–950)
Basophils Absolute: 30 {cells}/uL (ref 0–200)
Basophils Relative: 0.9 %
Eosinophils Absolute: 92 {cells}/uL (ref 15–500)
Eosinophils Relative: 2.8 %
HCT: 41.3 % (ref 38.5–50.0)
Hemoglobin: 13.3 g/dL (ref 13.2–17.1)
Lymphs Abs: 1122 {cells}/uL (ref 850–3900)
MCH: 27 pg (ref 27.0–33.0)
MCHC: 32.2 g/dL (ref 32.0–36.0)
MCV: 83.9 fL (ref 80.0–100.0)
MPV: 9.9 fL (ref 7.5–12.5)
Monocytes Relative: 9.8 %
Neutro Abs: 1733 {cells}/uL (ref 1500–7800)
Neutrophils Relative %: 52.5 %
Platelets: 251 10*3/uL (ref 140–400)
RBC: 4.92 10*6/uL (ref 4.20–5.80)
RDW: 14.1 % (ref 11.0–15.0)
Total Lymphocyte: 34 %
WBC: 3.3 10*3/uL — ABNORMAL LOW (ref 3.8–10.8)

## 2022-09-28 LAB — HEMOGLOBIN A1C
Hgb A1c MFr Bld: 7.4 %{Hb} — ABNORMAL HIGH (ref <5.7)
Mean Plasma Glucose: 166 mg/dL
eAG (mmol/L): 9.2 mmol/L

## 2022-09-28 LAB — LIPID PANEL
Cholesterol: 245 mg/dL — ABNORMAL HIGH (ref <200)
HDL: 75 mg/dL (ref >=40)
LDL Cholesterol (Calc): 154 mg/dL — ABNORMAL HIGH
Non-HDL Cholesterol (Calc): 170 mg/dL — ABNORMAL HIGH (ref <130)
Total CHOL/HDL Ratio: 3.3 (calc) (ref <5.0)
Triglycerides: 69 mg/dL (ref <150)

## 2022-10-02 ENCOUNTER — Encounter: Payer: Self-pay | Admitting: Nurse Practitioner

## 2022-10-02 ENCOUNTER — Other Ambulatory Visit: Payer: Self-pay | Admitting: Nurse Practitioner

## 2022-10-02 ENCOUNTER — Ambulatory Visit: Payer: Medicare PPO | Admitting: Nurse Practitioner

## 2022-10-02 VITALS — BP 128/90 | HR 50 | Temp 98.1°F | Resp 14 | Ht 72.0 in | Wt 221.0 lb

## 2022-10-02 DIAGNOSIS — Z794 Long term (current) use of insulin: Secondary | ICD-10-CM | POA: Diagnosis not present

## 2022-10-02 DIAGNOSIS — Z23 Encounter for immunization: Secondary | ICD-10-CM

## 2022-10-02 DIAGNOSIS — E782 Mixed hyperlipidemia: Secondary | ICD-10-CM

## 2022-10-02 DIAGNOSIS — I10 Essential (primary) hypertension: Secondary | ICD-10-CM

## 2022-10-02 DIAGNOSIS — N182 Chronic kidney disease, stage 2 (mild): Secondary | ICD-10-CM

## 2022-10-02 DIAGNOSIS — E1122 Type 2 diabetes mellitus with diabetic chronic kidney disease: Secondary | ICD-10-CM | POA: Diagnosis not present

## 2022-10-02 DIAGNOSIS — E890 Postprocedural hypothyroidism: Secondary | ICD-10-CM

## 2022-10-02 MED ORDER — BLOOD PRESSURE MONITORING KIT: 1.0000 | PACK | 0 refills | Status: DC

## 2022-10-02 MED ORDER — ROSUVASTATIN CALCIUM 10 MG PO TABS: 10.0000 mg | ORAL_TABLET | Freq: Every day | ORAL | 0 refills | Status: DC

## 2022-10-02 MED ORDER — LANTUS SOLOSTAR 100 UNIT/ML ~~LOC~~ SOPN: 37.0000 [IU] | PEN_INJECTOR | Freq: Every day | SUBCUTANEOUS | Status: DC

## 2022-10-02 NOTE — Patient Instructions (Addendum)
SET UP WEEKLY PILL CONTAINER  :)   Take lantus in the evening- increase to 37 units   Make sure you are taking metformin TWICE DAILY Take METFORMIN AT BREAKFAST and DINNER    Start taking CRESTOR  in the evening with your LANTUS  CRESTOR is to help bring your cholesterol to goal.  STOP lovastatin   PUT ASA 81 mg EC with your morning medication   Increase WATER intake.

## 2022-10-02 NOTE — Telephone Encounter (Signed)
Pharmacy comment: Script Clarification:PLEASE CLARIFY SIG.

## 2022-10-02 NOTE — Progress Notes (Signed)
Careteam: Patient Care Team: Sharon Seller, NP as PCP - General (Geriatric Medicine) Herschel Senegal, OD (Optometry)  PLACE OF SERVICE:  [facility]  Advanced Directive information Does Patient Have a Medical Advance Directive?: No  No Known Allergies  Chief Complaint  Patient presents with   Follow-up    3 month follow up     HPI: Patient is a 72 y.o. male for routine follow up  DM- A1c 7.4 improved from 7.6 but not at goal. He is taking lantus - takes when he thinks of it- does take it daily though He has a full day and hard to be consistent with medication- feels like he is getting better.   He takes care of wife and getting her medication together, now he feels like he has a regimen and can get his set as well.  Does not take bp at home because did not get the correct cuff at pharmacy.    Review of Systems:  Review of Systems  Constitutional:  Negative for chills, fever and weight loss.  HENT:  Negative for tinnitus.   Respiratory:  Negative for cough, sputum production and shortness of breath.   Cardiovascular:  Negative for chest pain, palpitations and leg swelling.  Gastrointestinal:  Negative for abdominal pain, constipation, diarrhea and heartburn.  Genitourinary:  Negative for dysuria, frequency and urgency.  Musculoskeletal:  Negative for back pain, falls, joint pain and myalgias.  Skin: Negative.   Neurological:  Negative for dizziness and headaches.  Psychiatric/Behavioral:  Negative for depression and memory loss. The patient does not have insomnia.     Past Medical History:  Diagnosis Date   Bell's palsy    2005   Carpal tunnel syndrome    Cataract    CKD stage 2 due to type 2 diabetes mellitus (HCC) 12/01/2015   Diabetes mellitus without complication (HCC)    Edema    Gout, unspecified    Hyperlipidemia    Hypertension    Internal hemorrhoids without mention of complication    Malignant neoplasm of thyroid gland (HCC)    Memory loss     Mixed disorders as reaction to stress    Obesity, unspecified    Other malaise and fatigue    Other psoriasis    Sleep apnea    no CPAP used 09-23-15   Supraventricular premature beats    Thyroid disease    Trigger finger (acquired)    Unspecified sleep apnea    Past Surgical History:  Procedure Laterality Date   CATARACT EXTRACTION W/ INTRAOCULAR LENS IMPLANT Right 06/05/2016   Dr. Edrick Oh   COLONOSCOPY  10/07/2015   one polyp, Dr. Myrtie Neither   TOTAL THYROIDECTOMY  2007   Dr. Lebron Conners   Social History:   reports that he quit smoking about 43 years ago. His smoking use included cigarettes. He has never used smokeless tobacco. He reports that he does not currently use alcohol. He reports that he does not use drugs.  Family History  Problem Relation Age of Onset   Cancer - Prostate Father    Stomach cancer Father    Hypertension Other    Colon cancer Neg Hx    Esophageal cancer Neg Hx    Rectal cancer Neg Hx     Medications: Patient's Medications  New Prescriptions   ROSUVASTATIN (CRESTOR) 10 MG TABLET    Take 1 tablet (10 mg total) by mouth at bedtime.  Previous Medications   ASPIRIN EC 81 MG TABLET  Take 81 mg by mouth daily.   BLOOD GLUCOSE METER KIT AND SUPPLIES    Dispense based on patient and insurance preference. Use up to four times daily as directed. (FOR ICD-10 E10.9, E11.9).   BLOOD PRESSURE MONITORING (BLOOD PRESSURE CUFF) MISC    To check blood pressure three times weekly after taking medication   GLUCOSE BLOOD (ACCU-CHEK GUIDE) TEST STRIP    USE AS DIRECTED   INSULIN PEN NEEDLE (B-D ULTRAFINE III SHORT PEN) 31G X 8 MM MISC    Use for insulin injections. Dx: E11.22   KETOCONAZOLE (NIZORAL) 2 % CREAM    APPLY TO BOTH FEET AND BETWEEN TOES ONCE DAILY FOR 6 WEEKS.   LEVOTHYROXINE (SYNTHROID) 112 MCG TABLET    TAKE 1 TABLET BY MOUTH EVERY DAY BEFORE BREAKFAST   LOSARTAN (COZAAR) 100 MG TABLET    TAKE 1 TABLET BY MOUTH EVERY DAY   METFORMIN (GLUCOPHAGE) 500 MG  TABLET    Take 1 tablet (500 mg total) by mouth 2 (two) times daily with a meal.   MULTIPLE VITAMIN (MULTIVITAMIN) TABLET    Take 1 tablet by mouth daily.   NONFORMULARY OR COMPOUNDED ITEM    Antifungal solution: Terbinafine 3%, Fluconazole 2%, Tea Tree Oil 5%, Urea 10%, Ibuprofen 2% in DMSO suspension #1mL   SILDENAFIL (VIAGRA) 100 MG TABLET    TAKE ONE TABLET BY MOUTH DAILY AS NEEDED FOR ERECTILE DYSFUNCTION  Modified Medications   Modified Medication Previous Medication   INSULIN GLARGINE (LANTUS SOLOSTAR) 100 UNIT/ML SOLOSTAR PEN insulin glargine (LANTUS SOLOSTAR) 100 UNIT/ML Solostar Pen      Inject 37 Units into the skin at bedtime.    Inject 35 Units into the skin at bedtime.  Discontinued Medications   B COMPLEX VITAMINS CAPSULE    Take 1 capsule by mouth daily.   LOVASTATIN (MEVACOR) 40 MG TABLET    TAKE 1 TABLET DAILY TO CONTROL CHOLESTEROL   VITAMIN C (ASCORBIC ACID) 250 MG TABLET    Take 500 mg by mouth daily.    Physical Exam:  Vitals:   10/02/22 1021 10/02/22 1101  BP: (!) 140/90 (!) 128/90  Pulse: (!) 50   Resp: 14   Temp: 98.1 F (36.7 C)   SpO2: 99%   Weight: 221 lb (100.2 kg)   Height: 6' (1.829 m)    Body mass index is 29.97 kg/m. Wt Readings from Last 3 Encounters:  10/02/22 221 lb (100.2 kg)  06/23/22 220 lb 3.2 oz (99.9 kg)  03/17/22 217 lb (98.4 kg)    Physical Exam Constitutional:      General: He is not in acute distress.    Appearance: He is well-developed. He is not diaphoretic.  HENT:     Head: Normocephalic and atraumatic.     Right Ear: External ear normal.     Left Ear: External ear normal.     Mouth/Throat:     Pharynx: No oropharyngeal exudate.  Eyes:     Conjunctiva/sclera: Conjunctivae normal.     Pupils: Pupils are equal, round, and reactive to light.  Cardiovascular:     Rate and Rhythm: Normal rate and regular rhythm.     Heart sounds: Normal heart sounds.  Pulmonary:     Effort: Pulmonary effort is normal.     Breath sounds:  Normal breath sounds.  Abdominal:     General: Bowel sounds are normal.     Palpations: Abdomen is soft.  Musculoskeletal:        General: No tenderness.  Cervical back: Normal range of motion and neck supple.     Right lower leg: No edema.     Left lower leg: No edema.  Skin:    General: Skin is warm and dry.  Neurological:     Mental Status: He is alert and oriented to person, place, and time.     Labs reviewed: Basic Metabolic Panel: Recent Labs    03/17/22 0911 06/20/22 1011 09/27/22 1036  NA 140  --  139  K 4.6  --  4.4  CL 104  --  105  CO2 31  --  28  GLUCOSE 165*  --  137*  BUN 12  --  18  CREATININE 1.30  --  1.38*  CALCIUM 9.6  --  9.6  TSH 19.41* 3.13  --    Liver Function Tests: Recent Labs    03/17/22 0911 09/27/22 1036  AST 15 16  ALT 13 12  BILITOT 0.5 0.4  PROT 7.0 7.3   No results for input(s): "LIPASE", "AMYLASE" in the last 8760 hours. No results for input(s): "AMMONIA" in the last 8760 hours. CBC: Recent Labs    03/17/22 0911 09/27/22 1036  WBC 3.5* 3.3*  NEUTROABS 1,939 1,733  HGB 13.4 13.3  HCT 40.7 41.3  MCV 82.2 83.9  PLT 291 251   Lipid Panel: Recent Labs    09/27/22 1036  CHOL 245*  HDL 75  LDLCALC 154*  TRIG 69  CHOLHDL 3.3   TSH: Recent Labs    03/17/22 0911 06/20/22 1011  TSH 19.41* 3.13   A1C: Lab Results  Component Value Date   HGBA1C 7.4 (H) 09/27/2022     Assessment/Plan 1. Need for influenza vaccination - Flu Vaccine Trivalent High Dose (Fluad)  2. Mixed hyperlipidemia -not at goal. Will stop current medication and start crestor 10 mg daily  -dietary modifications encouraged - rosuvastatin (CRESTOR) 10 MG tablet; Take 1 tablet (10 mg total) by mouth at bedtime.  Dispense: 90 tablet; Refill: 0 - Lipid panel; Future - COMPLETE METABOLIC PANEL WITH GFR; Future  3. Type 2 diabetes mellitus with stage 2 chronic kidney disease, with long-term current use of insulin (HCC) -Encouraged dietary  compliance, routine foot care/monitoring and to keep up with diabetic eye exams through ophthalmology  -he has not been taking metformin twice daily so will start this.  - Hemoglobin A1c; Future - will increase insulin glargine (LANTUS SOLOSTAR) 100 UNIT/ML Solostar Pen; Inject 37 Units into the skin at bedtime.  4. Essential hypertension --Blood pressure elevated today but typically well controlled -home blood pressures are well controlled -No changes to medications today  -will have pt continue to monitor home bp goal <140/90, to notify if readings remain high on 3 different days  -follow metabolic panel - COMPLETE METABOLIC PANEL WITH GFR; Future - CBC with Differential/Platelet; Future  5. Postoperative hypothyroidism Continues on synthroid - TSH; Future   Return in about 3 months (around 01/01/2023) for routine follow up.: Makayela Secrest K. [facility] [phone]

## 2022-10-10 ENCOUNTER — Other Ambulatory Visit: Payer: Self-pay | Admitting: Nurse Practitioner

## 2022-10-10 DIAGNOSIS — E782 Mixed hyperlipidemia: Secondary | ICD-10-CM

## 2022-11-28 ENCOUNTER — Ambulatory Visit: Payer: Medicare PPO | Admitting: Podiatry

## 2022-11-28 ENCOUNTER — Encounter: Payer: Self-pay | Admitting: Podiatry

## 2022-11-28 DIAGNOSIS — M79675 Pain in left toe(s): Secondary | ICD-10-CM

## 2022-11-28 DIAGNOSIS — N182 Chronic kidney disease, stage 2 (mild): Secondary | ICD-10-CM

## 2022-11-28 DIAGNOSIS — E1122 Type 2 diabetes mellitus with diabetic chronic kidney disease: Secondary | ICD-10-CM

## 2022-11-28 DIAGNOSIS — B351 Tinea unguium: Secondary | ICD-10-CM | POA: Diagnosis not present

## 2022-11-28 DIAGNOSIS — B353 Tinea pedis: Secondary | ICD-10-CM

## 2022-11-28 DIAGNOSIS — Z794 Long term (current) use of insulin: Secondary | ICD-10-CM

## 2022-11-28 DIAGNOSIS — R21 Rash and other nonspecific skin eruption: Secondary | ICD-10-CM

## 2022-11-28 DIAGNOSIS — L84 Corns and callosities: Secondary | ICD-10-CM | POA: Diagnosis not present

## 2022-11-28 DIAGNOSIS — M79674 Pain in right toe(s): Secondary | ICD-10-CM

## 2022-11-28 MED ORDER — HYDROCORTISONE 1 % EX OINT: TOPICAL_OINTMENT | CUTANEOUS | 0 refills | Status: DC

## 2022-12-01 NOTE — Progress Notes (Signed)
  Subjective:  Patient ID: Mitchell Gibbs, male    DOB: 15-Oct-1950,  MRN: [id number]  72 y.o. male presents at risk foot care. Pt has h/o NIDDM with chronic kidney disease and callus(es) left foot and painful thick toenails that are difficult to trim. Painful toenails interfere with ambulation. Aggravating factors include wearing enclosed shoe gear. Pain is relieved with periodic professional debridement. Painful calluses are aggravated when weightbearing with and without shoegear. Pain is relieved with periodic professional debridement. He is requesting refill of Ketoconazole Cream for tinea pedis. Chief Complaint  Patient presents with   Routine Post Op    RFC : PATIENT STATES THAT HE SEEN HIS PCP WAS SEPTEMBER 24 .    New problem(s): None   PCP is Sharon Seller, NP , and last visit was October 02, 2022.  No Known Allergies  Review of Systems: Negative except as noted in the HPI.   Objective:  Mitchell Gibbs is a pleasant 72 y.o. male obese in NAD. AAO x 3.  Vascular Examination: Vascular status intact b/l with palpable pedal pulses. CFT immediate b/l. Pedal hair present. No edema. No pain with calf compression b/l. Skin temperature gradient WNL b/l. +Varicosities b/l.  No cyanosis or clubbing noted.  Neurological Examination: Sensation grossly intact b/l with 10 gram monofilament. Vibratory sensation intact b/l.  Dermatological Examination: Pedal skin with normal turgor, texture and tone b/l. No open wounds nor interdigital macerations noted. Toenails 1-5 b/l thick, discolored, elongated with subungual debris and pain on dorsal palpation.   Hyperkeratotic lesion(s) submet head 3 left foot and submet head 5 right foot.  No erythema, no edema, no drainage, no fluctuance. Diffuse scaling noted peripherally and plantarly b/l feet.  No interdigital macerations.  No blisters, no weeping. No signs of secondary bacterial infection noted.  Musculoskeletal Examination: Muscle  strength 5/5 to b/l LE.  No pain, crepitus noted b/l. Hammertoe deformity noted 2-5 b/l. Patient ambulates independently without assistive aids.   Radiographs: None  Last A1c:      Latest Ref Rng & Units 09/27/2022   10:36 AM 06/20/2022   10:11 AM 03/17/2022    9:11 AM  Hemoglobin A1C  Hemoglobin-A1c <5.7 % of total Hgb 7.4  7.6  9.1    Assessment:   1. Pain due to onychomycosis of toenails of both feet   2. Callus   3. Tinea pedis of left foot   4. Type 2 diabetes mellitus with stage 2 chronic kidney disease, with long-term current use of insulin (HCC)    Plan:  -Consent given for treatment as described below: -Examined patient. -Continue foot and shoe inspections daily. Monitor blood glucose per PCP/Endocrinologist's recommendations. -Continue supportive shoe gear daily. -Toenails 1-5 b/l were debrided in length and girth with sterile nail nippers and dremel without iatrogenic bleeding.  -Callus(es) submet head 3 left foot and submet head 5 right foot pared utilizing sterile scalpel blade without complication or incident. Total number debrided =2. -For tinea pedis, Rx sent to pharmacy for Ketoconazole Cream 2% to be applied once daily for six weeks. -Patient/POA to call should there be question/concern in the interim.  Return in about 3 months (around 02/28/2023).  Freddie Breech, DPM

## 2022-12-04 ENCOUNTER — Other Ambulatory Visit: Payer: Self-pay | Admitting: Nurse Practitioner

## 2022-12-04 DIAGNOSIS — E782 Mixed hyperlipidemia: Secondary | ICD-10-CM

## 2022-12-04 MED ORDER — ROSUVASTATIN CALCIUM 10 MG PO TABS: 10.0000 mg | ORAL_TABLET | Freq: Every day | ORAL | 1 refills | Status: DC

## 2022-12-11 ENCOUNTER — Other Ambulatory Visit: Payer: Self-pay | Admitting: Nurse Practitioner

## 2022-12-11 DIAGNOSIS — E1122 Type 2 diabetes mellitus with diabetic chronic kidney disease: Secondary | ICD-10-CM

## 2022-12-11 DIAGNOSIS — I10 Essential (primary) hypertension: Secondary | ICD-10-CM

## 2023-01-01 ENCOUNTER — Encounter: Payer: Self-pay | Admitting: Nurse Practitioner

## 2023-01-01 DIAGNOSIS — H26493 Other secondary cataract, bilateral: Secondary | ICD-10-CM | POA: Diagnosis not present

## 2023-01-01 DIAGNOSIS — H52203 Unspecified astigmatism, bilateral: Secondary | ICD-10-CM | POA: Diagnosis not present

## 2023-01-01 DIAGNOSIS — E119 Type 2 diabetes mellitus without complications: Secondary | ICD-10-CM | POA: Diagnosis not present

## 2023-01-01 LAB — HM DIABETES EYE EXAM

## 2023-01-05 ENCOUNTER — Encounter: Payer: Self-pay | Admitting: Nurse Practitioner

## 2023-01-05 ENCOUNTER — Ambulatory Visit: Payer: Medicare PPO | Admitting: Nurse Practitioner

## 2023-01-05 VITALS — BP 142/88 | HR 63 | Temp 97.3°F | Ht 72.0 in | Wt 223.0 lb

## 2023-01-05 DIAGNOSIS — E782 Mixed hyperlipidemia: Secondary | ICD-10-CM | POA: Diagnosis not present

## 2023-01-05 DIAGNOSIS — Z794 Long term (current) use of insulin: Secondary | ICD-10-CM | POA: Diagnosis not present

## 2023-01-05 DIAGNOSIS — K5904 Chronic idiopathic constipation: Secondary | ICD-10-CM

## 2023-01-05 DIAGNOSIS — E1122 Type 2 diabetes mellitus with diabetic chronic kidney disease: Secondary | ICD-10-CM | POA: Diagnosis not present

## 2023-01-05 DIAGNOSIS — E890 Postprocedural hypothyroidism: Secondary | ICD-10-CM

## 2023-01-05 DIAGNOSIS — I10 Essential (primary) hypertension: Secondary | ICD-10-CM

## 2023-01-05 DIAGNOSIS — N182 Chronic kidney disease, stage 2 (mild): Secondary | ICD-10-CM

## 2023-01-05 MED ORDER — BLOOD PRESSURE MONITOR 3 DEVI: 1.0000 | 0 refills | Status: AC

## 2023-01-05 NOTE — Progress Notes (Signed)
Careteam: Patient Care Team: Sharon Seller, NP as PCP - General (Geriatric Medicine) Herschel Senegal, OD (Optometry)  PLACE OF SERVICE:  [facility]  Advanced Directive information Does Patient Have a Medical Advance Directive?: No, Would patient like information on creating a medical advance directive?: Yes (MAU/Ambulatory/Procedural Areas - Information given)  No Known Allergies  Chief Complaint  Patient presents with   Medical Management of Chronic Issues    3 month follow-up. Discuss need for covid booster and shingrix.      HPI: Patient is a 72 y.o. male for routine folow up at [facility]  He took bp medication 30 mins ago.  Reports he is taking all his medication as prescribed.  Elevated in office, would like to hold off on adding additional medication until he can check at home Plans to get bp cuff and monitor at home.   Taking crestor at bedtime  DM- taking metformin twice and taking lanuts 38 units every night. No low blood sugars noted.  Takes blood sugars but does not have reading.   Continues to be constipated.    Review of Systems:  Review of Systems  Constitutional:  Negative for chills, fever and weight loss.  HENT:  Negative for tinnitus.   Respiratory:  Negative for cough, sputum production and shortness of breath.   Cardiovascular:  Negative for chest pain, palpitations and leg swelling.  Gastrointestinal:  Positive for constipation. Negative for abdominal pain, diarrhea and heartburn.  Genitourinary:  Negative for dysuria, frequency and urgency.  Musculoskeletal:  Negative for back pain, falls, joint pain and myalgias.  Skin: Negative.   Neurological:  Negative for dizziness and headaches.  Psychiatric/Behavioral:  Negative for depression and memory loss. The patient does not have insomnia.     Past Medical History:  Diagnosis Date   Bell's palsy    2005   Carpal tunnel syndrome    Cataract    CKD stage 2 due to type 2 diabetes mellitus  (HCC) 12/01/2015   Diabetes mellitus without complication (HCC)    Edema    Gout, unspecified    Hyperlipidemia    Hypertension    Internal hemorrhoids without mention of complication    Malignant neoplasm of thyroid gland (HCC)    Memory loss    Mixed disorders as reaction to stress    Obesity, unspecified    Other malaise and fatigue    Other psoriasis    Sleep apnea    no CPAP used 09-23-15   Supraventricular premature beats    Thyroid disease    Trigger finger (acquired)    Unspecified sleep apnea    Past Surgical History:  Procedure Laterality Date   CATARACT EXTRACTION W/ INTRAOCULAR LENS IMPLANT Right 06/05/2016   Dr. Edrick Oh   COLONOSCOPY  10/07/2015   one polyp, Dr. Myrtie Neither   TOTAL THYROIDECTOMY  2007   Dr. Lebron Conners   Social History:   reports that he quit smoking about 44 years ago. His smoking use included cigarettes. He has never used smokeless tobacco. He reports that he does not currently use alcohol. He reports that he does not use drugs.  Family History  Problem Relation Age of Onset   Cancer - Prostate Father    Stomach cancer Father    Hypertension Other    Colon cancer Neg Hx    Esophageal cancer Neg Hx    Rectal cancer Neg Hx     Medications: Patient's Medications  New Prescriptions   No medications on  file  Previous Medications   ASPIRIN EC 81 MG TABLET    Take 81 mg by mouth daily.   BLOOD GLUCOSE METER KIT AND SUPPLIES    Dispense based on patient and insurance preference. Use up to four times daily as directed. (FOR ICD-10 E10.9, E11.9).   BLOOD PRESSURE MONITORING (BLOOD PRESSURE MONITOR 3) DEVI    1 KIT BY DOES NOT APPLY ROUTE ONCE A WEEK.   GLUCOSE BLOOD (ACCU-CHEK GUIDE) TEST STRIP    USE AS DIRECTED   HYDROCORTISONE 1 % OINTMENT    Apply to rash twice daily until resolved.   INSULIN GLARGINE (LANTUS SOLOSTAR) 100 UNIT/ML SOLOSTAR PEN    Inject 37 Units into the skin at bedtime.   INSULIN PEN NEEDLE (B-D ULTRAFINE III SHORT PEN) 31G X 8  MM MISC    Use for insulin injections. Dx: E11.22   KETOCONAZOLE (NIZORAL) 2 % CREAM    APPLY TO BOTH FEET AND BETWEEN TOES ONCE DAILY FOR 6 WEEKS.   LEVOTHYROXINE (SYNTHROID) 112 MCG TABLET    TAKE 1 TABLET BY MOUTH EVERY DAY BEFORE BREAKFAST   LOSARTAN (COZAAR) 100 MG TABLET    TAKE 1 TABLET BY MOUTH EVERY DAY   METFORMIN (GLUCOPHAGE) 500 MG TABLET    TAKE 1 TABLET BY MOUTH 2 TIMES DAILY WITH A MEAL.   MULTIPLE VITAMIN (MULTIVITAMIN) TABLET    Take 1 tablet by mouth daily.   NONFORMULARY OR COMPOUNDED ITEM    Antifungal solution: Terbinafine 3%, Fluconazole 2%, Tea Tree Oil 5%, Urea 10%, Ibuprofen 2% in DMSO suspension #25mL   ROSUVASTATIN (CRESTOR) 10 MG TABLET    Take 1 tablet (10 mg total) by mouth at bedtime.   SILDENAFIL (VIAGRA) 100 MG TABLET    TAKE ONE TABLET BY MOUTH DAILY AS NEEDED FOR ERECTILE DYSFUNCTION  Modified Medications   No medications on file  Discontinued Medications   No medications on file    Physical Exam:  Vitals:   01/05/23 0827 01/05/23 0853  BP: (!) 148/92 (!) 142/88  Pulse: 63   Temp: (!) 97.3 F (36.3 C)   TempSrc: Temporal   SpO2: 99%   Weight: 223 lb (101.2 kg)   Height: 6' (1.829 m)    Body mass index is 30.24 kg/m. Wt Readings from Last 3 Encounters:  01/05/23 223 lb (101.2 kg)  10/02/22 221 lb (100.2 kg)  06/23/22 220 lb 3.2 oz (99.9 kg)    Physical Exam Constitutional:      General: He is not in acute distress.    Appearance: He is well-developed. He is not diaphoretic.  HENT:     Head: Normocephalic and atraumatic.     Right Ear: External ear normal.     Left Ear: External ear normal.     Mouth/Throat:     Pharynx: No oropharyngeal exudate.  Eyes:     Conjunctiva/sclera: Conjunctivae normal.     Pupils: Pupils are equal, round, and reactive to light.  Cardiovascular:     Rate and Rhythm: Normal rate and regular rhythm.     Heart sounds: Normal heart sounds.  Pulmonary:     Effort: Pulmonary effort is normal.     Breath  sounds: Normal breath sounds.  Abdominal:     General: Bowel sounds are normal.     Palpations: Abdomen is soft.  Musculoskeletal:        General: No tenderness.     Cervical back: Normal range of motion and neck supple.     Right  lower leg: No edema.     Left lower leg: No edema.  Skin:    General: Skin is warm and dry.  Neurological:     Mental Status: He is alert and oriented to person, place, and time.     Labs reviewed: Basic Metabolic Panel: Recent Labs    03/17/22 0911 06/20/22 1011 09/27/22 1036  NA 140  --  139  K 4.6  --  4.4  CL 104  --  105  CO2 31  --  28  GLUCOSE 165*  --  137*  BUN 12  --  18  CREATININE 1.30  --  1.38*  CALCIUM 9.6  --  9.6  TSH 19.41* 3.13  --    Liver Function Tests: Recent Labs    03/17/22 0911 09/27/22 1036  AST 15 16  ALT 13 12  BILITOT 0.5 0.4  PROT 7.0 7.3   No results for input(s): "LIPASE", "AMYLASE" in the last 8760 hours. No results for input(s): "AMMONIA" in the last 8760 hours. CBC: Recent Labs    03/17/22 0911 09/27/22 1036  WBC 3.5* 3.3*  NEUTROABS 1,939 1,733  HGB 13.4 13.3  HCT 40.7 41.3  MCV 82.2 83.9  PLT 291 251   Lipid Panel: Recent Labs    09/27/22 1036  CHOL 245*  HDL 75  LDLCALC 154*  TRIG 69  CHOLHDL 3.3   TSH: Recent Labs    03/17/22 0911 06/20/22 1011  TSH 19.41* 3.13   A1C: Lab Results  Component Value Date   HGBA1C 7.4 (H) 09/27/2022     Assessment/Plan 1. Mixed hyperlipidemia (Primary) -continues on crestor 10 mg daily with dietary modifications - Lipid panel - COMPLETE METABOLIC PANEL WITH GFR  2. Type 2 diabetes mellitus with stage 2 chronic kidney disease, with long-term current use of insulin (HCC) -Encouraged dietary compliance, routine foot care/monitoring and to keep up with diabetic eye exams through ophthalmology  Continues on metformin and lantus - Hemoglobin A1c   3. Essential hypertension -blood pressure over goal today Discussed adding additional  medication to get to goal but he would like to wait until he can check his blood pressure at home  -dietary modifications also discussed - COMPLETE METABOLIC PANEL WITH GFR - CBC with Differential/Platelet - Blood Pressure Monitoring (BLOOD PRESSURE MONITOR 3) DEVI; 1 Device by Other route as directed.  Dispense: 1 each; Refill: 0  4. Postoperative hypothyroidism TSH at goal on June, continue synthroid 112 mcg  5. Chronic idiopathic constipation -to add miralax daily to regimen    Return in about 4 months (around 05/06/2023) for routine follow up .  Janene Harvey. Biagio Borg [facility] [phone]

## 2023-01-05 NOTE — Patient Instructions (Addendum)
Purchase a large size B/P cuff 34-60 cm    To take miralax 17 gm daily, add to 8 oz of fluid and drink daily

## 2023-01-06 LAB — CBC WITH DIFFERENTIAL/PLATELET
Absolute Lymphocytes: 1227 {cells}/uL (ref 850–3900)
Absolute Monocytes: 342 {cells}/uL (ref 200–950)
Basophils Absolute: 19 {cells}/uL (ref 0–200)
Basophils Relative: 0.5 %
Eosinophils Absolute: 122 {cells}/uL (ref 15–500)
Eosinophils Relative: 3.2 %
HCT: 40.8 % (ref 38.5–50.0)
Hemoglobin: 13.1 g/dL — ABNORMAL LOW (ref 13.2–17.1)
MCH: 27.2 pg (ref 27.0–33.0)
MCHC: 32.1 g/dL (ref 32.0–36.0)
MCV: 84.6 fL (ref 80.0–100.0)
MPV: 9.8 fL (ref 7.5–12.5)
Monocytes Relative: 9 %
Neutro Abs: 2090 {cells}/uL (ref 1500–7800)
Neutrophils Relative %: 55 %
Platelets: 230 10*3/uL (ref 140–400)
RBC: 4.82 10*6/uL (ref 4.20–5.80)
RDW: 14 % (ref 11.0–15.0)
Total Lymphocyte: 32.3 %
WBC: 3.8 10*3/uL (ref 3.8–10.8)

## 2023-01-06 LAB — COMPLETE METABOLIC PANEL WITHOUT GFR
AG Ratio: 1.2 (calc) (ref 1.0–2.5)
ALT: 15 U/L (ref 9–46)
AST: 19 U/L (ref 10–35)
Albumin: 3.9 g/dL (ref 3.6–5.1)
Alkaline phosphatase (APISO): 58 U/L (ref 35–144)
BUN: 15 mg/dL (ref 7–25)
CO2: 29 mmol/L (ref 20–32)
Calcium: 9.7 mg/dL (ref 8.6–10.3)
Chloride: 105 mmol/L (ref 98–110)
Creat: 1.21 mg/dL (ref 0.70–1.28)
Globulin: 3.2 g/dL (ref 1.9–3.7)
Glucose, Bld: 86 mg/dL (ref 65–99)
Potassium: 4.1 mmol/L (ref 3.5–5.3)
Sodium: 140 mmol/L (ref 135–146)
Total Bilirubin: 0.4 mg/dL (ref 0.2–1.2)
Total Protein: 7.1 g/dL (ref 6.1–8.1)
eGFR: 64 mL/min/1.73m2

## 2023-01-06 LAB — LIPID PANEL
Cholesterol: 156 mg/dL (ref <200)
HDL: 67 mg/dL (ref >=40)
LDL Cholesterol (Calc): 76 mg/dL
Non-HDL Cholesterol (Calc): 89 mg/dL (ref <130)
Total CHOL/HDL Ratio: 2.3 (calc) (ref <5.0)
Triglycerides: 58 mg/dL (ref <150)

## 2023-01-06 LAB — HEMOGLOBIN A1C
Hgb A1c MFr Bld: 7.2 %{Hb} — ABNORMAL HIGH (ref <5.7)
Mean Plasma Glucose: 160 mg/dL
eAG (mmol/L): 8.9 mmol/L

## 2023-04-04 ENCOUNTER — Encounter: Payer: Self-pay | Admitting: Podiatry

## 2023-04-04 ENCOUNTER — Ambulatory Visit: Payer: Medicare PPO | Admitting: Podiatry

## 2023-04-04 DIAGNOSIS — B351 Tinea unguium: Secondary | ICD-10-CM

## 2023-04-04 DIAGNOSIS — M79674 Pain in right toe(s): Secondary | ICD-10-CM

## 2023-04-04 DIAGNOSIS — E119 Type 2 diabetes mellitus without complications: Secondary | ICD-10-CM

## 2023-04-04 DIAGNOSIS — L84 Corns and callosities: Secondary | ICD-10-CM

## 2023-04-04 DIAGNOSIS — M2011 Hallux valgus (acquired), right foot: Secondary | ICD-10-CM

## 2023-04-04 DIAGNOSIS — M2041 Other hammer toe(s) (acquired), right foot: Secondary | ICD-10-CM

## 2023-04-04 DIAGNOSIS — M2012 Hallux valgus (acquired), left foot: Secondary | ICD-10-CM

## 2023-04-04 DIAGNOSIS — E1122 Type 2 diabetes mellitus with diabetic chronic kidney disease: Secondary | ICD-10-CM

## 2023-04-04 DIAGNOSIS — Z794 Long term (current) use of insulin: Secondary | ICD-10-CM | POA: Diagnosis not present

## 2023-04-04 DIAGNOSIS — M79675 Pain in left toe(s): Secondary | ICD-10-CM

## 2023-04-04 DIAGNOSIS — N182 Chronic kidney disease, stage 2 (mild): Secondary | ICD-10-CM | POA: Diagnosis not present

## 2023-04-04 DIAGNOSIS — M2042 Other hammer toe(s) (acquired), left foot: Secondary | ICD-10-CM

## 2023-04-04 NOTE — Progress Notes (Signed)
 ANNUAL DIABETIC FOOT EXAM  Subjective: Mitchell Gibbs presents today for annual diabetic foot exam.  Chief Complaint  Patient presents with   Diabetes    "Get my nails trimmed and check my feet."  Saw Dr. Abbey Chatters - 01/05/2023, A1c - 7.2   Patient confirms h/o diabetes.  Patient denies any h/o foot wounds.  Patient has been diagnosed with neuropathy.  Sharon Seller, NP is patient's PCP.  Past Medical History:  Diagnosis Date   Bell's palsy    2005   Carpal tunnel syndrome    Cataract    CKD stage 2 due to type 2 diabetes mellitus (HCC) 12/01/2015   Diabetes mellitus without complication (HCC)    Edema    Gout, unspecified    Hyperlipidemia    Hypertension    Internal hemorrhoids without mention of complication    Malignant neoplasm of thyroid gland (HCC)    Memory loss    Mixed disorders as reaction to stress    Obesity, unspecified    Other malaise and fatigue    Other psoriasis    Sleep apnea    no CPAP used 09-23-15   Supraventricular premature beats    Thyroid disease    Trigger finger (acquired)    Unspecified sleep apnea    Patient Active Problem List   Diagnosis Date Noted   Postoperative hypothyroidism 05/16/2017   CKD stage 2 due to type 2 diabetes mellitus (HCC) 12/01/2015   BPPV (benign paroxysmal positional vertigo) 10/06/2014   Tinea pedis of left foot 02/09/2014   Erectile dysfunction 03/12/2013   DM (diabetes mellitus), type 2 with renal complications (HCC) 09/01/2012   Hypertension    Sleep apnea    Hyperlipidemia    Past Surgical History:  Procedure Laterality Date   CATARACT EXTRACTION W/ INTRAOCULAR LENS IMPLANT Right 06/05/2016   Dr. Edrick Oh   COLONOSCOPY  10/07/2015   one polyp, Dr. Myrtie Neither   TOTAL THYROIDECTOMY  2007   Dr. Lebron Conners   Current Outpatient Medications on File Prior to Visit  Medication Sig Dispense Refill   aspirin EC 81 MG tablet Take 81 mg by mouth daily.     blood glucose meter kit and supplies  Dispense based on patient and insurance preference. Use up to four times daily as directed. (FOR ICD-10 E10.9, E11.9). 1 each 0   Blood Pressure Monitoring (BLOOD PRESSURE MONITOR 3) DEVI 1 Device by Other route as directed. 1 each 0   glucose blood (ACCU-CHEK GUIDE) test strip USE AS DIRECTED 100 strip 2   hydrocortisone 1 % ointment Apply to rash twice daily until resolved. 454 g 0   insulin glargine (LANTUS SOLOSTAR) 100 UNIT/ML Solostar Pen Inject 37 Units into the skin at bedtime.     Insulin Pen Needle (B-D ULTRAFINE III SHORT PEN) 31G X 8 MM MISC Use for insulin injections. Dx: E11.22 300 each 3   ketoconazole (NIZORAL) 2 % cream APPLY TO BOTH FEET AND BETWEEN TOES ONCE DAILY FOR 6 WEEKS. 60 g 1   levothyroxine (SYNTHROID) 112 MCG tablet TAKE 1 TABLET BY MOUTH EVERY DAY BEFORE BREAKFAST 90 tablet 1   losartan (COZAAR) 100 MG tablet TAKE 1 TABLET BY MOUTH EVERY DAY 90 tablet 1   metFORMIN (GLUCOPHAGE) 500 MG tablet TAKE 1 TABLET BY MOUTH 2 TIMES DAILY WITH A MEAL. 180 tablet 1   Multiple Vitamin (MULTIVITAMIN) tablet Take 1 tablet by mouth daily.     NONFORMULARY OR COMPOUNDED ITEM Antifungal solution: Terbinafine 3%,  Fluconazole 2%, Tea Tree Oil 5%, Urea 10%, Ibuprofen 2% in DMSO suspension #50mL 1 each 3   rosuvastatin (CRESTOR) 10 MG tablet Take 1 tablet (10 mg total) by mouth at bedtime. 90 tablet 1   sildenafil (VIAGRA) 100 MG tablet TAKE ONE TABLET BY MOUTH DAILY AS NEEDED FOR ERECTILE DYSFUNCTION 22 tablet 5   No current facility-administered medications on file prior to visit.    No Known Allergies Social History   Occupational History   Not on file  Tobacco Use   Smoking status: Former    Current packs/day: 0.00    Types: Cigarettes    Quit date: 10/27/1978    Years since quitting: 44.4   Smokeless tobacco: Never  Vaping Use   Vaping status: Never Used  Substance and Sexual Activity   Alcohol use: Not Currently    Comment: rarely   Drug use: No   Sexual activity: Not  on file   Family History  Problem Relation Age of Onset   Cancer - Prostate Father    Stomach cancer Father    Hypertension Other    Colon cancer Neg Hx    Esophageal cancer Neg Hx    Rectal cancer Neg Hx    Immunization History  Administered Date(s) Administered   Fluad Quad(high Dose 65+) 09/30/2018, 10/06/2019, 10/06/2020, 12/02/2021   Fluad Trivalent(High Dose 65+) 10/02/2022   Influenza-Unspecified 09/16/2012, 09/17/2014, 09/17/2015, 10/17/2016, 10/19/2017   PFIZER Comirnaty(Gray Top)Covid-19 Tri-Sucrose Vaccine 07/07/2020   PFIZER(Purple Top)SARS-COV-2 Vaccination 02/04/2019, 02/25/2019, 10/15/2019   Pfizer Covid-19 Vaccine Bivalent Booster 72yrs & up 10/20/2020   Pneumococcal Conjugate-13 05/12/2014, 03/18/2018   Pneumococcal Polysaccharide-23 10/29/2001, 04/04/2019   Tdap 03/06/2011, 07/22/2021     Review of Systems: Negative except as noted in the HPI.   Objective: There were no vitals filed for this visit.  Mitchell Gibbs is a pleasant 73 y.o. male in NAD. AAO X 3.  Diabetic foot exam was performed with the following findings:   Vascular Examination: Capillary refill time immediate b/l. Vascular status intact b/l with palpable pedal pulses. Pedal hair present b/l. No pain with calf compression b/l. Skin temperature gradient WNL b/l. No cyanosis or clubbing b/l. No ischemia or gangrene noted b/l. Varicosities present b/l.  Neurological Examination: Sensation grossly intact b/l with 10 gram monofilament. Vibratory sensation intact b/l.   Dermatological Examination: Pedal skin with normal turgor, texture and tone b/l.  No open wounds. No interdigital macerations.   Toenails 1-5 b/l thick, discolored, elongated with subungual debris and pain on dorsal palpation.   Hyperkeratotic lesion(s) submet head 3 left foot and submet head 5 right foot.  No erythema, no edema, no drainage, no fluctuance.  Musculoskeletal Examination: Muscle strength 5/5 to all lower extremity  muscle groups bilaterally. HAV with bunion bilaterally and hammertoes 2-5 b/l. Patient ambulates independent of any assistive aids.  Radiographs: None     Lab Results  Component Value Date   HGBA1C 7.2 (H) 01/05/2023   ADA Risk Categorization: Low Risk :  Patient has all of the following: Intact protective sensation No prior foot ulcer  No severe deformity Pedal pulses present  Assessment: 1. Pain due to onychomycosis of toenails of both feet   2. Callus   3. Hallux valgus, acquired, bilateral   4. Acquired hammertoes of both feet   5. Type 2 diabetes mellitus with stage 2 chronic kidney disease, with long-term current use of insulin (HCC)   6. Encounter for diabetic foot exam (HCC)     Plan:  Diabetic foot examination performed. All patient's and/or POA's questions/concerns addressed on today's visit. Mycotic toenails 1-5 debrided in length and girth without incident. Callus(es) submet head 3 left foot and submet head 5 right foot pared with sharp debridement without incident. Continue daily foot inspections and monitor blood glucose per PCP/Endocrinologist's recommendations. Continue soft, supportive shoe gear daily. Report any pedal injuries to medical professional. Call office if there are any questions/concerns. -Patient/POA to call should there be question/concern in the interim. Return in about 3 months (around 07/05/2023).  Mitchell Gibbs, DPM      [employer] LOCATION: [address] [place] [address]                   Office [phone]   [facility] LOCATION: [address] [place], [place] [address] Office [phone]

## 2023-04-09 ENCOUNTER — Encounter: Payer: Self-pay | Admitting: Podiatry

## 2023-04-10 ENCOUNTER — Other Ambulatory Visit: Payer: Self-pay | Admitting: Nurse Practitioner

## 2023-04-10 DIAGNOSIS — E1122 Type 2 diabetes mellitus with diabetic chronic kidney disease: Secondary | ICD-10-CM

## 2023-04-13 ENCOUNTER — Other Ambulatory Visit: Payer: Self-pay | Admitting: Nurse Practitioner

## 2023-04-13 DIAGNOSIS — E782 Mixed hyperlipidemia: Secondary | ICD-10-CM

## 2023-04-13 DIAGNOSIS — E890 Postprocedural hypothyroidism: Secondary | ICD-10-CM

## 2023-04-26 ENCOUNTER — Ambulatory Visit: Payer: Medicare PPO | Admitting: Nurse Practitioner

## 2023-04-26 DIAGNOSIS — Z0000 Encounter for general adult medical examination without abnormal findings: Secondary | ICD-10-CM

## 2023-04-26 NOTE — Progress Notes (Signed)
 Subjective:   Mitchell Gibbs is a 73 y.o. male who presents for Medicare Annual/Subsequent preventive examination.  Visit Complete: Virtual I connected with  [employer] on 04/26/23 by a video and audio enabled telemedicine application and verified that I am speaking with the correct person using two identifiers.  Patient Location: Home  Provider Location: Office/Clinic  I discussed the limitations of evaluation and management by telemedicine. The patient expressed understanding and agreed to proceed.  Vital Signs: Because this visit was a virtual/telehealth visit, some criteria may be missing or patient reported. Any vitals not documented were not able to be obtained and vitals that have been documented are patient reported.    Cardiac Risk Factors include: advanced age (>48men, >43 women);dyslipidemia;hypertension;diabetes mellitus;sedentary lifestyle;obesity (BMI >30kg/m2)     Objective:    There were no vitals filed for this visit. There is no height or weight on file to calculate BMI.     04/26/2023   10:00 AM 01/05/2023    8:50 AM 10/02/2022   10:20 AM 06/23/2022   10:04 AM 04/18/2022   10:41 AM 03/17/2022    8:25 AM 07/22/2021   11:43 AM  Advanced Directives  Does Patient Have a Medical Advance Directive? No No No No No No No  Would patient like information on creating a medical advance directive? Yes (MAU/Ambulatory/Procedural Areas - Information given) Yes (MAU/Ambulatory/Procedural Areas - Information given)  No - Patient declined No - Patient declined Yes (MAU/Ambulatory/Procedural Areas - Information given)     Current Medications (verified) Outpatient Encounter Medications as of 04/26/2023  Medication Sig   aspirin EC 81 MG tablet Take 81 mg by mouth daily.   blood glucose meter kit and supplies Dispense based on patient and insurance preference. Use up to four times daily as directed. (FOR ICD-10 E10.9, E11.9).   Blood Pressure Monitoring (BLOOD PRESSURE MONITOR  3) DEVI 1 Device by Other route as directed.   glucose blood (ACCU-CHEK GUIDE) test strip USE AS DIRECTED   hydrocortisone 1 % ointment Apply to rash twice daily until resolved.   insulin glargine (LANTUS SOLOSTAR) 100 UNIT/ML Solostar Pen Inject 37 Units into the skin at bedtime.   Insulin Pen Needle (B-D ULTRAFINE III SHORT PEN) 31G X 8 MM MISC Use for insulin injections. Dx: E11.22   ketoconazole (NIZORAL) 2 % cream APPLY TO BOTH FEET AND BETWEEN TOES ONCE DAILY FOR 6 WEEKS.   levothyroxine (SYNTHROID) 112 MCG tablet TAKE 1 TABLET BY MOUTH EVERY DAY BEFORE BREAKFAST   losartan (COZAAR) 100 MG tablet TAKE 1 TABLET BY MOUTH EVERY DAY   metFORMIN (GLUCOPHAGE) 500 MG tablet TAKE 1 TABLET BY MOUTH 2 TIMES DAILY WITH A MEAL.   Multiple Vitamin (MULTIVITAMIN) tablet Take 1 tablet by mouth daily.   NONFORMULARY OR COMPOUNDED ITEM Antifungal solution: Terbinafine 3%, Fluconazole 2%, Tea Tree Oil 5%, Urea 10%, Ibuprofen 2% in DMSO suspension #80mL   rosuvastatin (CRESTOR) 10 MG tablet TAKE 1 TABLET BY MOUTH EVERYDAY AT BEDTIME   sildenafil (VIAGRA) 100 MG tablet TAKE ONE TABLET BY MOUTH DAILY AS NEEDED FOR ERECTILE DYSFUNCTION   No facility-administered encounter medications on file as of 04/26/2023.    Allergies (verified) Patient has no known allergies.   History: Past Medical History:  Diagnosis Date   Bell's palsy    2005   Carpal tunnel syndrome    Cataract    CKD stage 2 due to type 2 diabetes mellitus (HCC) 12/01/2015   Diabetes mellitus without complication (HCC)  Edema    Gout, unspecified    Hyperlipidemia    Hypertension    Internal hemorrhoids without mention of complication    Malignant neoplasm of thyroid gland (HCC)    Memory loss    Mixed disorders as reaction to stress    Obesity, unspecified    Other malaise and fatigue    Other psoriasis    Sleep apnea    no CPAP used 09-23-15   Supraventricular premature beats    Thyroid disease    Trigger finger (acquired)     Unspecified sleep apnea    Past Surgical History:  Procedure Laterality Date   CATARACT EXTRACTION W/ INTRAOCULAR LENS IMPLANT Right 06/05/2016   Dr. Edrick Oh   COLONOSCOPY  10/07/2015   one polyp, Dr. Myrtie Neither   TOTAL THYROIDECTOMY  2007   Dr. Lebron Conners   Family History  Problem Relation Age of Onset   Cancer - Prostate Father    Stomach cancer Father    Hypertension Other    Colon cancer Neg Hx    Esophageal cancer Neg Hx    Rectal cancer Neg Hx    Social History   Socioeconomic History   Marital status: Married    Spouse name: Not on file   Number of children: Not on file   Years of education: Not on file   Highest education level: Bachelor's degree (e.g., BA, AB, BS)  Occupational History   Not on file  Tobacco Use   Smoking status: Former    Current packs/day: 0.00    Types: Cigarettes    Quit date: 10/27/1978    Years since quitting: 44.5   Smokeless tobacco: Never  Vaping Use   Vaping status: Never Used  Substance and Sexual Activity   Alcohol use: Not Currently    Comment: rarely   Drug use: No   Sexual activity: Not on file  Other Topics Concern   Not on file  Social History Narrative   Not on file   Social Drivers of Health   Financial Resource Strain: Low Risk  (06/22/2022)   Overall Financial Resource Strain (CARDIA)    Difficulty of Paying Living Expenses: Not hard at all  Food Insecurity: No Food Insecurity (06/22/2022)   Hunger Vital Sign    Worried About Running Out of Food in the Last Year: Never true    Ran Out of Food in the Last Year: Never true  Transportation Needs: No Transportation Needs (06/22/2022)   PRAPARE - Administrator, Civil Service (Medical): No    Lack of Transportation (Non-Medical): No  Physical Activity: Sufficiently Active (06/22/2022)   Exercise Vital Sign    Days of Exercise per Week: 2 days    Minutes of Exercise per Session: 90 min  Stress: No Stress Concern Present (06/22/2022)   [employer] of  Occupational Health - Occupational Stress Questionnaire    Feeling of Stress : Only a little  Social Connections: Socially Integrated (06/22/2022)   Social Connection and Isolation Panel [NHANES]    Frequency of Communication with Friends and Family: More than three times a week    Frequency of Social Gatherings with Friends and Family: Twice a week    Attends Religious Services: More than 4 times per year    Active Member of [employer] or Organizations: Yes    Attends Engineer, structural: More than 4 times per year    Marital Status: Married    Tobacco Counseling Counseling given: Not Answered  Clinical Intake:  Pre-visit preparation completed: Yes  Pain : No/denies pain     BMI - recorded: 30  How often do you need to have someone help you when you read instructions, pamphlets, or other written materials from your doctor or pharmacy?: 1 - Never         Activities of Daily Living    04/26/2023    9:56 AM  In your present state of health, do you have any difficulty performing the following activities:  Hearing? 0  Vision? 0  Difficulty concentrating or making decisions? 0  Walking or climbing stairs? 0  Dressing or bathing? 0  Doing errands, shopping? 0  Preparing Food and eating ? N  Using the Toilet? N  In the past six months, have you accidently leaked urine? Y  Do you have problems with loss of bowel control? N  Managing your Medications? N  Managing your Finances? N  Housekeeping or managing your Housekeeping? N    Patient Care Team: Sharon Seller, NP as PCP - General (Geriatric Medicine) Herschel Senegal, OD (Optometry)  Indicate any recent Medical Services you may have received from other than [facility] providers in the past year (date may be approximate).     Assessment:   This is a routine wellness examination for [place].  Hearing/Vision screen No results found.   Goals Addressed   None    Depression Screen    04/26/2023    9:59 AM  10/02/2022   10:20 AM 04/18/2022   10:41 AM 03/17/2022    8:24 AM 10/10/2019    8:41 AM 04/04/2019    8:40 AM 11/02/2017    9:58 AM  PHQ 2/9 Scores  PHQ - 2 Score 0 0 0 0 0 0 0    Fall Risk    04/26/2023    9:59 AM 10/02/2022   10:20 AM 06/23/2022   10:04 AM 04/18/2022   10:41 AM 03/17/2022    8:24 AM  Fall Risk   Falls in the past year? 1 0 0 0 0  Number falls in past yr:   0 0 0  Injury with Fall?   0 0 0  Risk for fall due to :   No Fall Risks  No Fall Risks  Follow up Falls evaluation completed  Falls evaluation completed  Falls evaluation completed    MEDICARE RISK AT HOME: Medicare Risk at Home Any stairs in or around the home?: Yes If so, are there any without handrails?: Yes Home free of loose throw rugs in walkways, pet beds, electrical cords, etc?: Yes Adequate lighting in your home to reduce risk of falls?: Yes Life alert?: No Use of a cane, walker or w/c?: No Grab bars in the bathroom?: No Shower chair or bench in shower?: Yes Elevated toilet seat or a handicapped toilet?: No  TIMED UP AND GO:  Was the test performed?  No    Cognitive Function:    12/29/2014    4:22 PM  MMSE - Mini Mental State Exam  Not completed: --  Orientation to time 5  Orientation to Place 5  Registration 3  Attention/ Calculation 5  Recall 1  Language- name 2 objects 2  Language- repeat 1  Language- follow 3 step command 3  Language- read & follow direction 1  Write a sentence 1  Copy design 1  Total score 28        04/26/2023   10:02 AM 04/18/2022   10:41 AM  6CIT Screen  What Year? 0 points 0 points  What month? 0 points 0 points  What time? 0 points 0 points  Count back from 20 0 points 0 points  Months in reverse 0 points 0 points  Repeat phrase 2 points 0 points  Total Score 2 points 0 points    Immunizations Immunization History  Administered Date(s) Administered   Fluad Quad(high Dose 65+) 09/30/2018, 10/06/2019, 10/06/2020, 12/02/2021   Fluad Trivalent(High Dose  65+) 10/02/2022   Influenza-Unspecified 09/16/2012, 09/17/2014, 09/17/2015, 10/17/2016, 10/19/2017   PFIZER Comirnaty(Gray Top)Covid-19 Tri-Sucrose Vaccine 07/07/2020   PFIZER(Purple Top)SARS-COV-2 Vaccination 02/04/2019, 02/25/2019, 10/15/2019   Pfizer Covid-19 Vaccine Bivalent Booster 60yrs & up 10/20/2020   Pneumococcal Conjugate-13 05/12/2014, 03/18/2018   Pneumococcal Polysaccharide-23 10/29/2001, 04/04/2019   Tdap 03/06/2011, 07/22/2021    TDAP status: Up to date  Flu Vaccine status: Up to date  Pneumococcal vaccine status: Up to date  Covid-19 vaccine status: Information provided on how to obtain vaccines.   Qualifies for Shingles Vaccine? Yes   Zostavax completed No   Shingrix Completed?: No.    Education has been provided regarding the importance of this vaccine. Patient has been advised to call insurance company to determine out of pocket expense if they have not yet received this vaccine. Advised may also receive vaccine at local pharmacy or Health Dept. Verbalized acceptance and understanding.  Screening Tests Health Maintenance  Topic Date Due   Zoster Vaccines- Shingrix (1 of 2) Never done   COVID-19 Vaccine (6 - 2024-25 season) 09/17/2022   Diabetic kidney evaluation - Urine ACR  03/17/2023   HEMOGLOBIN A1C  07/06/2023   INFLUENZA VACCINE  08/17/2023   OPHTHALMOLOGY EXAM  01/01/2024   Diabetic kidney evaluation - eGFR measurement  01/05/2024   FOOT EXAM  04/03/2024   Medicare Annual Wellness (AWV)  04/25/2024   Colonoscopy  10/06/2025   DTaP/Tdap/Td (3 - Td or Tdap) 07/23/2031   Pneumonia Vaccine 31+ Years old  Completed   Hepatitis C Screening  Completed   HPV VACCINES  Aged Out   Meningococcal B Vaccine  Aged Out    Health Maintenance  Health Maintenance Due  Topic Date Due   Zoster Vaccines- Shingrix (1 of 2) Never done   COVID-19 Vaccine (6 - 2024-25 season) 09/17/2022   Diabetic kidney evaluation - Urine ACR  03/17/2023    Colorectal cancer  screening: Type of screening: Colonoscopy. Completed 2023. Repeat every 10 years  Lung Cancer Screening: (Low Dose CT Chest recommended if Age 8-80 years, 20 pack-year currently smoking OR have quit w/in 15years.) does not qualify.   Lung Cancer Screening Referral: na  Additional Screening:  Hepatitis C Screening: does qualify; Completed   Vision Screening: Recommended annual ophthalmology exams for early detection of glaucoma and other disorders of the eye. Is the patient up to date with their annual eye exam?  Yes  Who is the provider or what is the name of the office in which the patient attends annual eye exams? [facility] If pt is not established with a provider, would they like to be referred to a provider to establish care? No .   Dental Screening: Recommended annual dental exams for proper oral hygiene  Diabetic Foot Exam: Diabetic Foot Exam: Completed 04/04/2023  Community Resource Referral / Chronic Care Management: CRR required this visit?  No   CCM required this visit?  No     Plan:     I have personally reviewed and noted the following in the patient's chart:  Medical and social history Use of alcohol, tobacco or illicit drugs  Current medications and supplements including opioid prescriptions. Patient is not currently taking opioid prescriptions. Functional ability and status Nutritional status Physical activity Advanced directives List of other physicians Hospitalizations, surgeries, and ER visits in previous 12 months Vitals Screenings to include cognitive, depression, and falls Referrals and appointments  In addition, I have reviewed and discussed with patient certain preventive protocols, quality metrics, and best practice recommendations. A written personalized care plan for preventive services as well as general preventive health recommendations were provided to patient.     Sharon Seller, NP   04/26/2023   After Visit Summary: (MyChart)  Due to this being a telephonic visit, the after visit summary with patients personalized plan was offered to patient via MyChart

## 2023-04-29 ENCOUNTER — Other Ambulatory Visit: Payer: Self-pay | Admitting: Nurse Practitioner

## 2023-04-29 DIAGNOSIS — I10 Essential (primary) hypertension: Secondary | ICD-10-CM

## 2023-05-07 ENCOUNTER — Encounter: Payer: Medicare PPO | Admitting: Nurse Practitioner

## 2023-05-07 NOTE — Progress Notes (Signed)
 This encounter was created in error - please disregard.

## 2023-05-22 ENCOUNTER — Telehealth

## 2023-05-22 NOTE — Telephone Encounter (Signed)
 Patient called and voicemail was left. I was calling to inform him that handicap placard was up front for pick up.

## 2023-05-22 NOTE — Telephone Encounter (Signed)
 Copied from CRM [id number]. Topic: General - Other >> May 22, 2023 11:03 AM Retta Caster wrote: Reason for CRM: Patient returning call from office . Notes on file. Patient stated it may be on Handicap Plaque. Needs call back.  [phone]

## 2023-07-25 ENCOUNTER — Ambulatory Visit: Admitting: Podiatry

## 2023-07-25 ENCOUNTER — Encounter: Payer: Self-pay | Admitting: Podiatry

## 2023-07-25 DIAGNOSIS — L84 Corns and callosities: Secondary | ICD-10-CM

## 2023-07-25 DIAGNOSIS — M79674 Pain in right toe(s): Secondary | ICD-10-CM | POA: Diagnosis not present

## 2023-07-25 DIAGNOSIS — M79675 Pain in left toe(s): Secondary | ICD-10-CM

## 2023-07-25 DIAGNOSIS — B351 Tinea unguium: Secondary | ICD-10-CM | POA: Diagnosis not present

## 2023-07-25 DIAGNOSIS — Z794 Long term (current) use of insulin: Secondary | ICD-10-CM

## 2023-07-25 DIAGNOSIS — E1122 Type 2 diabetes mellitus with diabetic chronic kidney disease: Secondary | ICD-10-CM | POA: Diagnosis not present

## 2023-07-25 DIAGNOSIS — N182 Chronic kidney disease, stage 2 (mild): Secondary | ICD-10-CM

## 2023-07-25 NOTE — Progress Notes (Signed)
  Subjective:  Patient ID: Mitchell Gibbs, male    DOB: 05/10/1950,  MRN: [id number]  73 y.o. male presents at risk foot care. Pt has h/o NIDDM with chronic kidney disease and painful elongated mycotic toenails 1-5 bilaterally which are tender when wearing enclosed shoe gear. Pain is relieved with periodic professional debridement. Chief Complaint  Patient presents with   Diabetes    DFC IDDM A1C 7.2. Toenail trim. LOV with PCP 04/26/23.   New problem(s): None   PCP is Caro Harlene POUR, NP.  No Known Allergies  Review of Systems: Negative except as noted in the HPI.   Objective:  DAULTON Gibbs is a pleasant 73 y.o. male WD, WN in NAD. AAO x 3.  Vascular Examination: Capillary refill time immediate b/l. Palpable pedal pulses. Pedal hair present b/l. No pain with calf compression b/l. Skin temperature gradient WNL b/l. No cyanosis or clubbing b/l. No ischemia or gangrene noted b/l. Varicosities present b/l.  Neurological Examination: Sensation grossly intact b/l with 10 gram monofilament. Vibratory sensation intact b/l.   Dermatological Examination: Pedal skin with normal turgor, texture and tone b/l.  No open wounds. No interdigital macerations.   Toenails 1-5 b/l thick, discolored, elongated with subungual debris and pain on dorsal palpation.   Hyperkeratotic lesion(s) submet head 3 left foot and submet head 5 right foot.  No erythema, no edema, no drainage, no fluctuance.  Musculoskeletal Examination: Muscle strength 5/5 to all lower extremity muscle groups bilaterally. HAV with bunion deformity noted b/l LE. Hammertoe(s) 2-5 b/l.SABRA No pain, crepitus or joint limitation noted with ROM b/l LE.  Patient ambulates independently without assistive aids.  Radiographs: None  Last A1c:      Latest Ref Rng & Units 01/05/2023    9:10 AM 09/27/2022   10:36 AM  Hemoglobin A1C  Hemoglobin-A1c <5.7 % of total Hgb 7.2  7.4     Assessment:   1. Pain due to onychomycosis of toenails  of both feet   2. Callus   3. Type 2 diabetes mellitus with stage 2 chronic kidney disease, with long-term current use of insulin  [facility])    Plan:  Consent given for treatment. Patient examined. All patient's and/or POA's questions/concerns addressed on today's visit.Toenails 1-5 debrided in length and girth without incident. Callus(es) submet head 3 left foot and submet head 5 right foot pared with sharp debridement without incident. Continue soft, supportive shoe gear daily. Report any pedal injuries to medical professional. Call office if there are any questions/concerns.  Return in about 3 months (around 10/25/2023).  Delon LITTIE Merlin, DPM      [place] LOCATION: [address], [place] [address]                   Office [phone]   [facility] LOCATION: [address] [place], [place] [address] Office [phone]

## 2023-09-19 ENCOUNTER — Telehealth: Payer: Self-pay

## 2023-09-19 NOTE — Telephone Encounter (Signed)
 Copied from CRM [id number]. Topic: General - Other >> Sep 19, 2023  4:17 PM Miquel SAILOR wrote: Reason for CRM: Patient calling on when he was seeing last and if he wanted  to make app for Virtual. Was going to scheduled but he changed his mind. Will call back after he checks his schedule

## 2023-10-12 NOTE — Progress Notes (Signed)
 HAWTHORNE DAY                                          MRN: [id number]   10/12/2023   The [facility]BCI Quality Team Specialist reviewed this patient medical record for the purposes of chart review for care gap closure. The following were reviewed: chart review for care gap closure-glycemic status assessment.    VBCI Quality Team

## 2023-10-17 DIAGNOSIS — E890 Postprocedural hypothyroidism: Secondary | ICD-10-CM | POA: Diagnosis not present

## 2023-10-17 DIAGNOSIS — N1831 Chronic kidney disease, stage 3a: Secondary | ICD-10-CM | POA: Diagnosis not present

## 2023-10-17 DIAGNOSIS — B353 Tinea pedis: Secondary | ICD-10-CM | POA: Diagnosis not present

## 2023-10-17 DIAGNOSIS — E785 Hyperlipidemia, unspecified: Secondary | ICD-10-CM | POA: Diagnosis not present

## 2023-10-17 DIAGNOSIS — M2040 Other hammer toe(s) (acquired), unspecified foot: Secondary | ICD-10-CM | POA: Diagnosis not present

## 2023-10-17 DIAGNOSIS — I129 Hypertensive chronic kidney disease with stage 1 through stage 4 chronic kidney disease, or unspecified chronic kidney disease: Secondary | ICD-10-CM | POA: Diagnosis not present

## 2023-10-17 DIAGNOSIS — M2010 Hallux valgus (acquired), unspecified foot: Secondary | ICD-10-CM | POA: Diagnosis not present

## 2023-10-17 DIAGNOSIS — E11620 Type 2 diabetes mellitus with diabetic dermatitis: Secondary | ICD-10-CM | POA: Diagnosis not present

## 2023-10-17 DIAGNOSIS — E1122 Type 2 diabetes mellitus with diabetic chronic kidney disease: Secondary | ICD-10-CM | POA: Diagnosis not present

## 2023-10-30 ENCOUNTER — Ambulatory Visit (INDEPENDENT_AMBULATORY_CARE_PROVIDER_SITE_OTHER)

## 2023-10-30 DIAGNOSIS — Z23 Encounter for immunization: Secondary | ICD-10-CM | POA: Diagnosis not present

## 2023-10-30 NOTE — Progress Notes (Signed)
 Patient is in office today for a nurse visit for Immunization. Patient Injection was given in the  Left deltoid. Patient tolerated injection well.

## 2023-11-07 ENCOUNTER — Ambulatory Visit: Admitting: Podiatry

## 2023-11-07 ENCOUNTER — Encounter: Payer: Self-pay | Admitting: Podiatry

## 2023-11-07 DIAGNOSIS — E1122 Type 2 diabetes mellitus with diabetic chronic kidney disease: Secondary | ICD-10-CM | POA: Diagnosis not present

## 2023-11-07 DIAGNOSIS — L84 Corns and callosities: Secondary | ICD-10-CM | POA: Diagnosis not present

## 2023-11-07 DIAGNOSIS — Z794 Long term (current) use of insulin: Secondary | ICD-10-CM

## 2023-11-07 DIAGNOSIS — M79675 Pain in left toe(s): Secondary | ICD-10-CM | POA: Diagnosis not present

## 2023-11-07 DIAGNOSIS — B351 Tinea unguium: Secondary | ICD-10-CM

## 2023-11-07 DIAGNOSIS — N182 Chronic kidney disease, stage 2 (mild): Secondary | ICD-10-CM

## 2023-11-07 DIAGNOSIS — M79674 Pain in right toe(s): Secondary | ICD-10-CM | POA: Diagnosis not present

## 2023-11-07 NOTE — Progress Notes (Signed)
  Subjective:  Patient ID: Mitchell Gibbs, male    DOB: [birth year],  MRN: [id number]  73 y.o. male presents at risk foot care. Pt has h/o NIDDM with chronic kidney disease and callus(es) left foot and painful mycotic toenails that are difficult to trim. Painful toenails interfere with ambulation. Aggravating factors include wearing enclosed shoe gear. Pain is relieved with periodic professional debridement. Painful calluses are aggravated when weightbearing with and without shoegear. Pain is relieved with periodic professional debridement. Chief Complaint  Patient presents with   Diabetes    DFC NIDDM A1C 7.2. Toenail trim. LOV with PCP 04/26/2023.   New problem(s): None   PCP is Mitchell Harlene POUR, NP.  No Known Allergies  Review of Systems: Negative except as noted in the HPI.   Objective:  Mitchell Gibbs is a pleasant 73 y.o. male WD, WN in NAD. AAO x 3.  Vascular Examination: Vascular status intact b/l with palpable pedal pulses. CFT immediate b/l. Pedal hair present. No edema. No pain with calf compression b/l. Skin temperature gradient WNL b/l. No varicosities noted. No cyanosis or clubbing noted.  Neurological Examination: Sensation grossly intact b/l with 10 gram monofilament. Vibratory sensation intact b/l.  Dermatological Examination: Pedal skin with normal turgor, texture and tone b/l.  No open wounds. No interdigital macerations.   Toenails 1-5 b/l thick, discolored, elongated with subungual debris and pain on dorsal palpation.   Hyperkeratotic lesion(s) submet head 3 left foot.  No erythema, no edema, no drainage, no fluctuance.  Musculoskeletal Examination: Muscle strength 5/5 to all lower extremity muscle groups bilaterally. HAV with bunion deformity noted b/l LE. Hammertoe(s) 2-5 b/l.SABRA No pain, crepitus or joint limitation noted with ROM b/l LE.  Patient ambulates independently without assistive aids.  Radiographs: None  Last A1c:      Latest Ref Rng & Units  01/05/2023    9:10 AM  Hemoglobin A1C  Hemoglobin-A1c <5.7 % of total Hgb 7.2    Assessment:   1. Pain due to onychomycosis of toenails of both feet   2. Callus   3. Type 2 diabetes mellitus with stage 2 chronic kidney disease, with long-term current use of insulin  (HCC)    Plan:  Patient was evaluated and treated. All patient's and/or POA's questions/concerns addressed on today's visit. Mycotic toenails 1-5 b/l debrided in length and girth without incident. Callus(es) submet head 2 left foot pared with sharp debridement without incident. Continue daily foot inspections and monitor blood glucose per PCP/Endocrinologist's recommendations. Continue soft, supportive shoe gear daily. Report any pedal injuries to medical professional. Call office if there are any questions/concerns.  Return in about 3 months (around 02/07/2024).  Delon LITTIE Merlin, DPM      [place] LOCATION: [address] [place] [address]                   Office [phone]   [facility] LOCATION: [address] [place], [place] [address] Office [phone]

## 2023-11-23 LAB — LAB REPORT - SCANNED: A1c: 9.5

## 2023-11-28 ENCOUNTER — Ambulatory Visit (INDEPENDENT_AMBULATORY_CARE_PROVIDER_SITE_OTHER): Payer: Self-pay | Admitting: Nurse Practitioner

## 2023-11-28 ENCOUNTER — Encounter: Payer: Self-pay | Admitting: Nurse Practitioner

## 2023-11-28 VITALS — BP 138/102 | HR 52 | Temp 98.5°F | Resp 18 | Ht 72.0 in | Wt 229.8 lb

## 2023-11-28 DIAGNOSIS — I10 Essential (primary) hypertension: Secondary | ICD-10-CM

## 2023-11-28 DIAGNOSIS — E1122 Type 2 diabetes mellitus with diabetic chronic kidney disease: Secondary | ICD-10-CM | POA: Diagnosis not present

## 2023-11-28 DIAGNOSIS — N182 Chronic kidney disease, stage 2 (mild): Secondary | ICD-10-CM | POA: Diagnosis not present

## 2023-11-28 DIAGNOSIS — H6123 Impacted cerumen, bilateral: Secondary | ICD-10-CM

## 2023-11-28 DIAGNOSIS — R351 Nocturia: Secondary | ICD-10-CM | POA: Insufficient documentation

## 2023-11-28 DIAGNOSIS — E782 Mixed hyperlipidemia: Secondary | ICD-10-CM | POA: Diagnosis not present

## 2023-11-28 DIAGNOSIS — E890 Postprocedural hypothyroidism: Secondary | ICD-10-CM

## 2023-11-28 DIAGNOSIS — Z794 Long term (current) use of insulin: Secondary | ICD-10-CM | POA: Diagnosis not present

## 2023-11-28 MED ORDER — LOSARTAN POTASSIUM 100 MG PO TABS: 100.0000 mg | ORAL_TABLET | Freq: Every day | ORAL | 1 refills | Status: AC

## 2023-11-28 NOTE — Assessment & Plan Note (Signed)
 On Crestor  10 mg daily. Lipid profile to be checked today. - Ordered lipid profile. - Continue Crestor  10 mg daily. -dietary modifications as able.

## 2023-11-28 NOTE — Assessment & Plan Note (Signed)
 On Synthroid  112 mcg daily. TSH to be checked today. - Ordered TSH test. - Continue Synthroid  112 mcg daily.

## 2023-11-28 NOTE — Assessment & Plan Note (Signed)
 Blood pressure slightly elevated, possibly due to missed medication and stress. - Rechecked blood pressure before leaving the clinic. - Continue losartan  100 mg daily. -To check blood pressure 1 hour AFTER you have had your medication Make sure you have been sitting at least 5 mins  Record and let us  know.  Goal <140/90

## 2023-11-28 NOTE — Assessment & Plan Note (Signed)
 Urinates 2-3 times per night. No significant changes in urination frequency or flow. - Ordered PSA test.

## 2023-11-28 NOTE — Assessment & Plan Note (Signed)
 Blood sugars variable, no hypoglycemia.  - Ordered blood work including A1c and urine micro - Continue Lantus  37 units at bedtime. - Continue metformin  500 mg twice daily. - Ensure regular follow-up every six months for diabetes management. -Encouraged dietary compliance, routine foot care/monitoring and to keep up with diabetic eye exams through ophthalmology

## 2023-11-28 NOTE — Progress Notes (Signed)
 Careteam: Patient Care Team: Caro Harlene POUR, NP as PCP - General (Geriatric Medicine) Carletha Hussar, OD (Optometry)  PLACE OF SERVICE:  [facility]  Advanced Directive information    No Known Allergies  Chief Complaint  Patient presents with   Medical Management of Chronic Issues    Patient was last seen in Dec 2024  Has no concerns at the moment.  PT been debating about COVID vaccine Pt declined shingles.  Patient needed losartan  bp medication sent to preferred pharmacy.     HPI:  Discussed the use of AI scribe software for clinical note transcription with the patient, who gave verbal consent to proceed.  History of Present Illness Mitchell Gibbs is a 73 year old male with diabetes and hypertension who presents for a follow-up visit.  He has not been taking his blood pressure medication regularly and experienced an argument with his grandson this morning. He is currently taking losartan  100 mg daily for hypertension.  He has diabetes and is currently taking Lantus  37 units at bedtime and metformin  500 mg twice a day. He checks his blood sugars both fasting and randomly, but they are variable and he cannot recall specific numbers. No episodes of hypoglycemia.  He is also on Synthroid  112 mcg daily for thyroid  management   Crestor  10 mg daily for cholesterol management. He takes a baby aspirin daily as well.  He walks about three miles a day for exercise but reports eating out more frequently as he does not like to cook and his wife no longer cooks. His grandson, who lives with him, cooks meals that he does not prefer.  No numbness, tingling in the legs, changes in vision, dizziness, or sinus congestion. He reports not sleeping as well as he used to, reports being alert to his wife's movements at night.  He gets up two to three times a night to urinate, with no significant changes in frequency or flow.   Review of Systems:  Review of Systems  Constitutional:   Negative for chills, fever and weight loss.  HENT:  Negative for tinnitus.   Respiratory:  Negative for cough, sputum production and shortness of breath.   Cardiovascular:  Negative for chest pain, palpitations and leg swelling.  Gastrointestinal:  Negative for abdominal pain, constipation, diarrhea and heartburn.  Genitourinary:  Negative for dysuria, frequency and urgency.  Musculoskeletal:  Negative for back pain, falls, joint pain and myalgias.  Skin: Negative.   Neurological:  Negative for dizziness and headaches.  Psychiatric/Behavioral:  Negative for depression and memory loss. The patient does not have insomnia.     Past Medical History:  Diagnosis Date   Bell's palsy    2005   Carpal tunnel syndrome    Cataract    CKD stage 2 due to type 2 diabetes mellitus (HCC) 12/01/2015   Diabetes mellitus without complication (HCC)    Edema    Gout, unspecified    Hyperlipidemia    Hypertension    Internal hemorrhoids without mention of complication    Malignant neoplasm of thyroid  gland (HCC)    Memory loss    Mixed disorders as reaction to stress    Obesity, unspecified    Other malaise and fatigue    Other psoriasis    Sleep apnea    no CPAP used 09-23-15   Supraventricular premature beats    Thyroid  disease    Trigger finger (acquired)    Unspecified sleep apnea    Past Surgical History:  Procedure Laterality Date   CATARACT EXTRACTION W/ INTRAOCULAR LENS IMPLANT Right 06/05/2016   Dr. Naomi   COLONOSCOPY  10/07/2015   one polyp, Dr. Legrand   TOTAL THYROIDECTOMY  2007   Dr. Elsie Sink   Social History:   reports that he quit smoking about 45 years ago. His smoking use included cigarettes. He has never used smokeless tobacco. He reports that he does not currently use alcohol. He reports that he does not use drugs.  Family History  Problem Relation Age of Onset   Cancer - Prostate Father    Stomach cancer Father    Hypertension Other    Colon cancer Neg Hx     Esophageal cancer Neg Hx    Rectal cancer Neg Hx     Medications: Patient's Medications  New Prescriptions   No medications on file  Previous Medications   ASPIRIN EC 81 MG TABLET    Take 81 mg by mouth daily.   BLOOD GLUCOSE METER KIT AND SUPPLIES    Dispense based on patient and insurance preference. Use up to four times daily as directed. (FOR ICD-10 E10.9, E11.9).   BLOOD PRESSURE MONITORING (BLOOD PRESSURE MONITOR 3) DEVI    1 Device by Other route as directed.   GLUCOSE BLOOD (ACCU-CHEK GUIDE) TEST STRIP    USE AS DIRECTED   HYDROCORTISONE  1 % OINTMENT    Apply to rash twice daily until resolved.   INSULIN  GLARGINE (LANTUS  SOLOSTAR) 100 UNIT/ML SOLOSTAR PEN    Inject 37 Units into the skin at bedtime.   INSULIN  PEN NEEDLE (B-D ULTRAFINE III SHORT PEN) 31G X 8 MM MISC    Use for insulin  injections. Dx: E11.22   KETOCONAZOLE  (NIZORAL ) 2 % CREAM    APPLY TO BOTH FEET AND BETWEEN TOES ONCE DAILY FOR 6 WEEKS.   LEVOTHYROXINE  (SYNTHROID ) 112 MCG TABLET    TAKE 1 TABLET BY MOUTH EVERY DAY BEFORE BREAKFAST   METFORMIN  (GLUCOPHAGE ) 500 MG TABLET    TAKE 1 TABLET BY MOUTH 2 TIMES DAILY WITH A MEAL.   MULTIPLE VITAMIN (MULTIVITAMIN) TABLET    Take 1 tablet by mouth daily.   NONFORMULARY OR COMPOUNDED ITEM    Antifungal solution: Terbinafine  3%, Fluconazole 2%, Tea Tree Oil 5%, Urea 10%, Ibuprofen  2% in DMSO suspension #30mL   ROSUVASTATIN  (CRESTOR ) 10 MG TABLET    TAKE 1 TABLET BY MOUTH EVERYDAY AT BEDTIME  Modified Medications   Modified Medication Previous Medication   LOSARTAN  (COZAAR ) 100 MG TABLET losartan  (COZAAR ) 100 MG tablet      Take 1 tablet (100 mg total) by mouth daily.    TAKE 1 TABLET BY MOUTH EVERY DAY  Discontinued Medications   SILDENAFIL  (VIAGRA ) 100 MG TABLET    TAKE ONE TABLET BY MOUTH DAILY AS NEEDED FOR ERECTILE DYSFUNCTION    Physical Exam:  Vitals:   11/28/23 1104 11/28/23 1116  BP: (!) 138/100 (!) 138/102  Pulse: (!) 52   Resp: 18   Temp: 98.5 F (36.9 C)    SpO2: 99%   Weight: 229 lb 12.8 oz (104.2 kg)   Height: 6' (1.829 m)    Body mass index is 31.17 kg/m. Wt Readings from Last 3 Encounters:  11/28/23 229 lb 12.8 oz (104.2 kg)  01/05/23 223 lb (101.2 kg)  10/02/22 221 lb (100.2 kg)    Physical Exam Constitutional:      General: He is not in acute distress.    Appearance: He is well-developed. He is not diaphoretic.  HENT:     Head: Normocephalic and atraumatic.     Right Ear: External ear normal.     Left Ear: External ear normal.     Mouth/Throat:     Pharynx: No oropharyngeal exudate.  Eyes:     Conjunctiva/sclera: Conjunctivae normal.     Pupils: Pupils are equal, round, and reactive to light.  Cardiovascular:     Rate and Rhythm: Normal rate and regular rhythm.     Heart sounds: Normal heart sounds.  Pulmonary:     Effort: Pulmonary effort is normal.     Breath sounds: Normal breath sounds.  Abdominal:     General: Bowel sounds are normal.     Palpations: Abdomen is soft.  Musculoskeletal:        General: No tenderness.     Cervical back: Normal range of motion and neck supple.     Right lower leg: No edema.     Left lower leg: No edema.  Skin:    General: Skin is warm and dry.  Neurological:     Mental Status: He is alert and oriented to person, place, and time. Mental status is at baseline.  Psychiatric:        Mood and Affect: Mood normal.     Labs reviewed: Basic Metabolic Panel: Recent Labs    01/05/23 0910  NA 140  K 4.1  CL 105  CO2 29  GLUCOSE 86  BUN 15  CREATININE 1.21  CALCIUM  9.7   Liver Function Tests: Recent Labs    01/05/23 0910  AST 19  ALT 15  BILITOT 0.4  PROT 7.1   No results for input(s): LIPASE, AMYLASE in the last 8760 hours. No results for input(s): AMMONIA in the last 8760 hours. CBC: Recent Labs    01/05/23 0910  WBC 3.8  NEUTROABS 2,090  HGB 13.1*  HCT 40.8  MCV 84.6  PLT 230   Lipid Panel: Recent Labs    01/05/23 0910  CHOL 156  HDL 67   LDLCALC 76  TRIG 58  CHOLHDL 2.3   TSH: No results for input(s): TSH in the last 8760 hours. A1C: Lab Results  Component Value Date   HGBA1C 7.2 (H) 01/05/2023     Assessment/Plan  Postoperative hypothyroidism Assessment & Plan: On Synthroid  112 mcg daily. TSH to be checked today. - Ordered TSH test. - Continue Synthroid  112 mcg daily.  Orders: -     TSH  Essential hypertension Assessment & Plan: Blood pressure slightly elevated, possibly due to missed medication and stress. - Rechecked blood pressure before leaving the clinic. - Continue losartan  100 mg daily. -To check blood pressure 1 hour AFTER you have had your medication Make sure you have been sitting at least 5 mins  Record and let us  know.  Goal <140/90   Orders: -     Losartan  Potassium; Take 1 tablet (100 mg total) by mouth daily.  Dispense: 90 tablet; Refill: 1 -     CBC with Differential/Platelet -     Comprehensive metabolic panel with GFR  Mixed hyperlipidemia Assessment & Plan: On Crestor  10 mg daily. Lipid profile to be checked today. - Ordered lipid profile. - Continue Crestor  10 mg daily. -dietary modifications as able.   Orders: -     Lipid panel  Type 2 diabetes mellitus with stage 2 chronic kidney disease, with long-term current use of insulin  (HCC) Assessment & Plan: Blood sugars variable, no hypoglycemia.  - Ordered blood work including A1c and  urine micro - Continue Lantus  37 units at bedtime. - Continue metformin  500 mg twice daily. - Ensure regular follow-up every six months for diabetes management. -Encouraged dietary compliance, routine foot care/monitoring and to keep up with diabetic eye exams through ophthalmology   Orders: -     Hemoglobin A1c -     Microalbumin / creatinine urine ratio  Nocturia Assessment & Plan: Urinates 2-3 times per night. No significant changes in urination frequency or flow. - Ordered PSA test.  Orders: -     PSA  Cerumen impaction,  bilateral Bilateral cerumen impaction noted. - Performed ear lavage.   Return in about 6 months (around 05/27/2024) for routine follow up, labs at time of visit.:   Leina Babe K. Caro BODILY [facility] [phone]

## 2023-11-29 ENCOUNTER — Telehealth: Payer: Self-pay

## 2023-11-29 ENCOUNTER — Ambulatory Visit: Payer: Self-pay | Admitting: Nurse Practitioner

## 2023-11-29 ENCOUNTER — Other Ambulatory Visit: Payer: Self-pay | Admitting: Nurse Practitioner

## 2023-11-29 DIAGNOSIS — E1122 Type 2 diabetes mellitus with diabetic chronic kidney disease: Secondary | ICD-10-CM

## 2023-11-29 DIAGNOSIS — E890 Postprocedural hypothyroidism: Secondary | ICD-10-CM

## 2023-11-29 LAB — COMPREHENSIVE METABOLIC PANEL WITH GFR
AG Ratio: 1.3 (calc) (ref 1.0–2.5)
ALT: 12 U/L (ref 9–46)
AST: 20 U/L (ref 10–35)
Albumin: 4 g/dL (ref 3.6–5.1)
Alkaline phosphatase (APISO): 71 U/L (ref 35–144)
BUN/Creatinine Ratio: 11 (calc) (ref 6–22)
BUN: 14 mg/dL (ref 7–25)
CO2: 28 mmol/L (ref 20–32)
Calcium: 9.7 mg/dL (ref 8.6–10.3)
Chloride: 104 mmol/L (ref 98–110)
Creat: 1.31 mg/dL — ABNORMAL HIGH (ref 0.70–1.28)
Globulin: 3.1 g/dL (ref 1.9–3.7)
Glucose, Bld: 175 mg/dL — ABNORMAL HIGH (ref 65–99)
Potassium: 4.6 mmol/L (ref 3.5–5.3)
Sodium: 140 mmol/L (ref 135–146)
Total Bilirubin: 0.4 mg/dL (ref 0.2–1.2)
Total Protein: 7.1 g/dL (ref 6.1–8.1)
eGFR: 58 mL/min/1.73m2 — ABNORMAL LOW (ref >=60)

## 2023-11-29 LAB — CBC WITH DIFFERENTIAL/PLATELET
Absolute Lymphocytes: 1062 {cells}/uL (ref 850–3900)
Absolute Monocytes: 269 {cells}/uL (ref 200–950)
Basophils Absolute: 19 {cells}/uL (ref 0–200)
Basophils Relative: 0.6 %
Eosinophils Absolute: 51 {cells}/uL (ref 15–500)
Eosinophils Relative: 1.6 %
HCT: 40 % (ref 38.5–50.0)
Hemoglobin: 12.9 g/dL — ABNORMAL LOW (ref 13.2–17.1)
MCH: 27 pg (ref 27.0–33.0)
MCHC: 32.3 g/dL (ref 32.0–36.0)
MCV: 83.9 fL (ref 80.0–100.0)
MPV: 10.4 fL (ref 7.5–12.5)
Monocytes Relative: 8.4 %
Neutro Abs: 1798 {cells}/uL (ref 1500–7800)
Neutrophils Relative %: 56.2 %
Platelets: 224 Thousand/uL (ref 140–400)
RBC: 4.77 Million/uL (ref 4.20–5.80)
RDW: 13.9 % (ref 11.0–15.0)
Total Lymphocyte: 33.2 %
WBC: 3.2 Thousand/uL — ABNORMAL LOW (ref 3.8–10.8)

## 2023-11-29 LAB — TSH: TSH: 33.99 m[IU]/L — ABNORMAL HIGH (ref 0.40–4.50)

## 2023-11-29 LAB — PSA: PSA: 0.47 ng/mL (ref <=4.00)

## 2023-11-29 LAB — LIPID PANEL
Cholesterol: 167 mg/dL (ref <200)
HDL: 77 mg/dL (ref >=40)
LDL Cholesterol (Calc): 78 mg/dL
Non-HDL Cholesterol (Calc): 90 mg/dL (ref <130)
Total CHOL/HDL Ratio: 2.2 (calc) (ref <5.0)
Triglycerides: 42 mg/dL (ref <150)

## 2023-11-29 LAB — HEMOGLOBIN A1C
Hgb A1c MFr Bld: 9.8 % — ABNORMAL HIGH (ref <5.7)
Mean Plasma Glucose: 235 mg/dL
eAG (mmol/L): 13 mmol/L

## 2023-11-29 MED ORDER — LEVOTHYROXINE SODIUM 112 MCG PO TABS: 112.0000 ug | ORAL_TABLET | Freq: Every day | ORAL | 1 refills | Status: DC

## 2023-11-29 MED ORDER — OZEMPIC (0.25 OR 0.5 MG/DOSE) 2 MG/3ML ~~LOC~~ SOPN: 0.2500 mg | PEN_INJECTOR | SUBCUTANEOUS | 3 refills | Status: DC

## 2023-11-29 NOTE — Progress Notes (Unsigned)
 Please let pt know ozempic 0.25 mg injection sent to pharmacy- to administer into skin once weekly To make sure he is staying well hydrated while on medication Can cause constipation so makes sure to stay on top of this and can take stool softener or miralax  if needed  To take blood sugars daily in the morning and record.  Again will place referral to pharmacy so pharmD can follow up on blood sugars

## 2023-11-29 NOTE — Telephone Encounter (Signed)
 Message was sent to the [facility] Clinical Pool from PCP through an orders only encounter which prohibits the medical assistants from documenting follow-up.  Mychart message was sent to patient with providers instructions (last log in 11/28/23)

## 2023-11-30 LAB — MICROALBUMIN / CREATININE URINE RATIO
Creatinine, Urine: 169 mg/dL (ref 20–320)
Microalb Creat Ratio: 219 mg/g{creat} — ABNORMAL HIGH (ref <30)
Microalb, Ur: 37 mg/dL

## 2023-12-17 ENCOUNTER — Other Ambulatory Visit: Payer: Self-pay | Admitting: Nurse Practitioner

## 2023-12-17 DIAGNOSIS — E1122 Type 2 diabetes mellitus with diabetic chronic kidney disease: Secondary | ICD-10-CM

## 2023-12-19 ENCOUNTER — Other Ambulatory Visit: Payer: Self-pay

## 2023-12-19 NOTE — Progress Notes (Signed)
 Patient appearing on report for [employer] [facility] - Hypertension Control report due to last documented ambulatory blood pressure of 138/102 on 11/28/2023. Next appointment with PCP is 03/03/2024.   Outreached patient to discuss hypertension control and medication management.   Current antihypertensives:  Losartan  100 mg daily  The patient has an automated upper arm home BP machine. He was able to check his blood pressure today, which was elevated. He denies any recent missed doses or any symptoms of an urgency (e.g., headache, chest pain, shortness of breath). He did drink multiple cups of coffee this morning, which could be contributing to high readings. Would benefit from further pharmacy intervention.   Current blood pressure readings: 188/110 mmHg, 180/104 mmHg on recheck Patient denies hypotensive signs and symptoms including dizziness, lightheadedness.  Patient denies hypertensive symptoms including headache, chest pain, shortness of breath.  Diabetes:  Per PCP note on 11/13, the patient is supposed to have the pharmacy consulted for diabetes management. He is currently not checking his blood sugars often. Encouraged daily blood glucose checks. Denies any symptoms of low or high blood sugar. Discussed CGMs; the patient is amenable to trying a CGM to help monitor his blood sugar. He is due for a refill of Lantus ; the current prescription is expired and will need to be resent. Will follow up with the embedded pharmacist to ensure Lantus  refill and diabetes education.   Thoroughly discussed dietary recommendations for diabetes. Provided the ADA Diabetes Plate method link to the patient via MyChart. Counseled on minimizing sugar intake with honey and carbohydrates per meal. Encouraged increased water  intake. The patient would benefit from additional pharmacy intervention.    Assessment/Plan: Currently uncontrolled Reviewed goal blood pressure <130/80 Reviewed appropriate home BP monitoring  technique (avoid caffeine, smoking, and exercise for 30 minutes before checking, rest for at least 5 minutes before taking BP, sit with feet flat on the floor and back against a hard surface, uncross legs, and rest arm on flat surface) Reviewed to check blood pressure weekly, document, and provide at next provider visit Discussed dietary modifications, such as reduced salt intake, focus on whole grains, vegetables, lean proteins Discussed goal of 150 minutes of moderate intensity physical activity weekly Recommend to continue medications as prescribed. Will collaborate with embedded pharmacist to follow up with diabetes and blood pressure management Requires new prescription for Lantus  and a new prescription for a CGM Sent MyChart message with ADA recommended Diabetes Plate method  Woodie Jock, PharmD PGY1 Pharmacy Resident  12/19/2023

## 2023-12-20 ENCOUNTER — Telehealth: Payer: Self-pay | Admitting: Pharmacist

## 2023-12-20 DIAGNOSIS — E1121 Type 2 diabetes mellitus with diabetic nephropathy: Secondary | ICD-10-CM

## 2023-12-20 DIAGNOSIS — N182 Chronic kidney disease, stage 2 (mild): Secondary | ICD-10-CM

## 2023-12-20 MED ORDER — LANTUS SOLOSTAR 100 UNIT/ML ~~LOC~~ SOPN: 37.0000 [IU] | PEN_INJECTOR | Freq: Every day | SUBCUTANEOUS | 3 refills | Status: AC

## 2023-12-20 NOTE — Telephone Encounter (Signed)
-----   Message from [facility] sent at 12/19/2023  3:04 PM EST ----- Hey! I hope you're doing well! I spoke with Mitchell Gibbs today over the phone, he is currently struggling with diabetes and hypertension control. Per PCP note on 11/13, she recommended a pharmacy consult to follow for diabetes. Additionally, the patient needs a new Rx for Lantus , and he is amenable to trying a CGM. Would you be able to help facilitate this? Thank you so much!

## 2023-12-20 NOTE — Progress Notes (Signed)
   12/20/2023  Patient ID: Mitchell Gibbs, male   DOB: 06-27-1950, 73 y.o.   MRN: [id number]  Patient was called regarding diabetes management per referral.  Unfortunately, he did not answer his phone. HIPAA compliant message was left for him.    Patient spoke with Pharmacist, Woodie Jock, PharmD earlier this week and expressed concern with diabetes management. He said he would be willing to start CGM therapy.    In addition, he expressed the need for Lantus  refills.  Lantus  refills will be pended to the Patient's provider co-signature required.  Lab Results  Component Value Date   HGBA1C 9.8 (H) 11/28/2023   HGBA1C 7.2 (H) 01/05/2023   HGBA1C 7.4 (H) 09/27/2022     Plan:  Call Patient back in 3-5 business days.   Cassius DOROTHA Brought, PharmD, BCACP Clinical Pharmacist [phone]

## 2023-12-27 ENCOUNTER — Telehealth: Payer: Self-pay | Admitting: Pharmacist

## 2023-12-27 DIAGNOSIS — E782 Mixed hyperlipidemia: Secondary | ICD-10-CM

## 2023-12-27 NOTE — Progress Notes (Signed)
 12/27/2023 Name: Mitchell Gibbs MRN: [id number] DOB: 1950-11-22  Chief Complaint  Patient presents with   Medication Management    Diabetes     Mitchell Gibbs is a 73 y.o. year old male who presented for a telephone visit.   They were referred to the pharmacist by their PCP for assistance in managing diabetes.    Subjective: Patient is a 73 year old male with multiple medical conditions including but limited to: type 2 diabetes, hyperlipemia, hypothyroidism, CKD stage II, hypertension, and sleep apnea.  Care Team: Primary Care Provider: Caro Harlene POUR, NP ; Next Scheduled Visit: 03/03/2024   Medication Access/Adherence  Current Pharmacy:  [id number] [address], [place] - [phone] [place] [address] Phone: [phone] Fax: [phone]    Diabetes:  Current medications:   Ozempic  0.25 mg weekly Lantus  37 units daily Metformin  500 mg 1 tablet twice daily  Current glucose readings:   Doesn't really check his blood sugars. Says he would be open to CGM technology.  Patient denies hypoglycemic s/sx including  dizziness, shakiness, sweating. Patient denies hyperglycemic symptoms including  polyuria, polydipsia, polyphagia, nocturia, neuropathy, blurred vision.  Current meal patterns:  - Breakfast: [employer], pancakes, eggs, sausage or bacon - Lunch does'nt eat lunch much but maybe a chicken salad and crackers - Supper Salad to sub sandwhich, soup, cake occasionally - Snacks oranges, crackers with chicken sala, peanut butter - Drinks black coffee, V8 Splash juice,   Current physical activity: was walking 2 miles per day prior to the weather changing-challenged him to layer his clothing and perhaps walk two twenty minute walks    Macrovascular and Microvascular Risk Reduction:  Statin? yes (Rosuvastatin ); ACEi/ARB? Losartan  Last urinary albumin/creatinine ratio:  Lab Results  Component Value Date    MICRALBCREAT 219 (H) 11/28/2023   MICRALBCREAT 38 (H) 03/17/2022   MICRALBCREAT 14 03/05/2017   MICRALBCREAT 3.5 08/11/2013   Last eye exam:  Lab Results  Component Value Date   HMDIABEYEEXA No Retinopathy 01/01/2023   Last foot exam: 04/04/2023 Tobacco Use:  Tobacco Use: Medium Risk (11/28/2023)   Patient History    Smoking Tobacco Use: Former    Smokeless Tobacco Use: Never    Passive Exposure: Not on file     Objective:  Lab Results  Component Value Date   HGBA1C 9.8 (H) 11/28/2023    Lab Results  Component Value Date   CREATININE 1.31 (H) 11/28/2023   BUN 14 11/28/2023   NA 140 11/28/2023   K 4.6 11/28/2023   CL 104 11/28/2023   CO2 28 11/28/2023    Lab Results  Component Value Date   CHOL 167 11/28/2023   HDL 77 11/28/2023   LDLCALC 78 11/28/2023   TRIG 42 11/28/2023   CHOLHDL 2.2 11/28/2023    Medications Reviewed Today     Reviewed by Jolee Cassius PARAS, RPH (Pharmacist) on 12/27/23 at 1303  Med List Status: <None>   Medication Order Taking? Sig Documenting Provider Last Dose Status Informant  aspirin EC 81 MG tablet [id number] Yes Take 81 mg by mouth daily. [provider]  Active   blood glucose meter kit and supplies [id number] Yes Dispense based on patient and insurance preference. Use up to four times daily as directed. (FOR ICD-10 E10.9, E11.9). Caro Harlene POUR, NP  Active   Blood Pressure Monitoring (BLOOD PRESSURE MONITOR 3) DEVI [id number] Yes 1 Device by Other route as directed. Caro Harlene POUR, NP  Active  glucose blood (ACCU-CHEK GUIDE) test strip [id number] Yes USE AS DIRECTED Caro Harlene POUR, NP  Active   hydrocortisone  1 % ointment [id number] Yes Apply to rash twice daily until resolved. Gaynel Delon CROME, DPM  Active   insulin  glargine (LANTUS  SOLOSTAR) 100 UNIT/ML Solostar Pen [id number] Yes Inject 37 Units into the skin at bedtime. Caro Harlene POUR, NP  Active   Insulin  Pen Needle (B-D ULTRAFINE III SHORT PEN) 31G X 8 MM  MISC [id number] Yes Use for insulin  injections. Dx: E11.22 Caro Harlene POUR, NP  Active   ketoconazole  (NIZORAL ) 2 % cream [id number]  APPLY TO BOTH FEET AND BETWEEN TOES ONCE DAILY FOR 6 WEEKS. McCaughan, Dia D, DPM  Active   levothyroxine  (SYNTHROID ) 112 MCG tablet [id number] Yes Take 1 tablet (112 mcg total) by mouth daily before breakfast. Eubanks, Jessica K, NP  Active   losartan  (COZAAR ) 100 MG tablet [id number] Yes Take 1 tablet (100 mg total) by mouth daily. Caro Harlene POUR, NP  Active   metFORMIN  (GLUCOPHAGE ) 500 MG tablet [id number] Yes TAKE 1 TABLET BY MOUTH 2 TIMES DAILY WITH A MEAL. Eubanks, Jessica K, NP  Active   Multiple Vitamin (MULTIVITAMIN) tablet [id number] Yes Take 1 tablet by mouth daily. [provider]  Active   NONFORMULARY OR COMPOUNDED ITEM [id number] Yes Antifungal solution: Terbinafine  3%, Fluconazole 2%, Tea Tree Oil 5%, Urea 10%, Ibuprofen  2% in DMSO suspension #30mL [name], Jennifer L, DPM  Active   rosuvastatin  (CRESTOR ) 10 MG tablet [id number] Yes TAKE 1 TABLET BY MOUTH EVERYDAY AT BEDTIME Eubanks, Jessica K, NP  Active   Semaglutide ,0.25 or 0.5MG /DOS, (OZEMPIC , 0.25 OR 0.5 MG/DOSE,) 2 MG/3ML SOPN [id number] Yes Inject 0.25 mg into the skin once a week. Caro Harlene POUR, NP  Active               11/28/2023   11:16 AM 11/28/2023   11:04 AM 05/07/2023    8:44 AM  Vitals with BMI  Height  6' 0 6' 0  Weight  229 lbs 13 oz   BMI  31.16   Systolic 138 138   Diastolic 102 100   Pulse  52       Assessment/Plan:   Diabetes: - Currently uncontrolled; goal A1c <7%. Cardiorenal risk reduction is opportunities for improvement.. Blood pressure is not at goal <130/80. LDL is not at goal.  - Reviewed goal A1c, goal fasting, and goal 2 hour post prandial glucose. Recommended to check glucose with CGM - Recommend to start using CGM technology(Will send note to Provider) . Send in a refill of Rosuvastatin  Recommend to restart walking  (wear layers in the  cold versus not going out because it is cold) Discussed the plate method.  Other thoughts: Once Patient has completed four weeks of Ozempic  therapy, his dose needs to be titrated to 0.5 mg  weekly  His Metformin  dose could be titrated to 1000 mg twice daily   Follow Up Plan:  Send note to Patient's Provider about CGM therapy [facility] Herlene) Send Refill for Rosuvastatin  Send Patient the Plate Method via MyChart.

## 2023-12-30 MED ORDER — ROSUVASTATIN CALCIUM 10 MG PO TABS: 10.0000 mg | ORAL_TABLET | Freq: Every day | ORAL | 3 refills | Status: AC

## 2023-12-31 NOTE — Telephone Encounter (Signed)
 Sounds like a great plan. Thank you!

## 2024-01-02 LAB — OPHTHALMOLOGY REPORT-SCANNED

## 2024-01-21 DIAGNOSIS — Z794 Long term (current) use of insulin: Secondary | ICD-10-CM

## 2024-01-21 MED ORDER — OZEMPIC (0.25 OR 0.5 MG/DOSE) 2 MG/3ML ~~LOC~~ SOPN: 0.5000 mg | PEN_INJECTOR | SUBCUTANEOUS | 3 refills | Status: AC

## 2024-01-21 NOTE — Progress Notes (Signed)
 "  01/21/2024 Name: Mitchell Gibbs MRN: [id number] DOB: 08-13-1950  Chief Complaint  Patient presents with   Medication Management    Diabetes     Mitchell Gibbs is a 74 y.o. year old male who presented for a telephone visit.   They were referred to the pharmacist by their PCP for assistance in managing diabetes.    Subjective:  Mitchell Gibbs is a 74 year old male referred to Pharmacy Services for help with managing diabetes.  He has a medical history significant for:  chronic kidney disease stage 2, type 2 diabetes, hypertension, hyperlipidemia, hypothyroidism and sleep apnea.  Care Team: Primary Care Provider: Caro Harlene POUR, NP ; Next Scheduled Visit: 03/07/24   Medication Access/Adherence  Current Pharmacy:  [id number] - [place], [place] - [address] [place] [address] Phone: [phone] Fax: [phone]   Patient reports affordability concerns with their medications: No  Patient reports access/transportation concerns to their pharmacy: No  Patient reports adherence concerns with their medications:  No      Diabetes:  Current medications:   Metformin  500 mg 1 tablet twice daily Lantus  37 units at bedtime Ozempic  0.25 mg weekly  Current glucose readings:   (Patient does not check his blood sugar)   Patient denies hypoglycemic s/sx including  dizziness, shakiness, sweating. Patient denies hyperglycemic symptoms including  polyuria, polydipsia, polyphagia, nocturia, neuropathy, blurred vision.   Macrovascular and Microvascular Risk Reduction:  Statin? yes (Rosuvastatin  10 mg daily); ACEi/ARB? yes (Losartan  100 mg daily) Last urinary albumin/creatinine ratio:  Lab Results  Component Value Date   MICRALBCREAT 219 (H) 11/28/2023   MICRALBCREAT 38 (H) 03/17/2022   MICRALBCREAT 14 03/05/2017   MICRALBCREAT 3.5 08/11/2013   Last eye exam:  Lab Results  Component Value Date   HMDIABEYEEXA No Retinopathy 01/02/2024   Last  foot exam: 04/04/2023 Tobacco Use:  Tobacco Use: Medium Risk (11/28/2023)   Patient History    Smoking Tobacco Use: Former    Smokeless Tobacco Use: Never    Passive Exposure: Not on file     Objective:  Lab Results  Component Value Date   HGBA1C 9.8 (H) 11/28/2023    Lab Results  Component Value Date   CREATININE 1.31 (H) 11/28/2023   BUN 14 11/28/2023   NA 140 11/28/2023   K 4.6 11/28/2023   CL 104 11/28/2023   CO2 28 11/28/2023    Lab Results  Component Value Date   CHOL 167 11/28/2023   HDL 77 11/28/2023   LDLCALC 78 11/28/2023   TRIG 42 11/28/2023   CHOLHDL 2.2 11/28/2023    Medications Reviewed Today     Reviewed by Jolee Cassius PARAS, RPH (Pharmacist) on 01/21/24 at 1748  Med List Status: <None>   Medication Order Taking? Sig Documenting Provider Last Dose Status Informant  aspirin EC 81 MG tablet [id number] Yes Take 81 mg by mouth daily. [provider]  Active   blood glucose meter kit and supplies [id number] Yes Dispense based on patient and insurance preference. Use up to four times daily as directed. (FOR ICD-10 E10.9, E11.9). Caro Harlene POUR, NP  Active   Blood Pressure Monitoring (BLOOD PRESSURE MONITOR 3) DEVI [id number] Yes 1 Device by Other route as directed. Caro Harlene POUR, NP  Active   Continuous Glucose Sensor (FREESTYLE LIBRE 3 SENSOR) [place] [id number] Yes by Does not apply route. Apply one patch every 15 days [provider]  Active   glucose blood (ACCU-CHEK GUIDE) test strip [id number]  Yes USE AS DIRECTED Caro Harlene POUR, NP  Active   hydrocortisone  1 % ointment [id number] Yes Apply to rash twice daily until resolved. Gaynel Delon CROME, DPM  Active   insulin  glargine (LANTUS  SOLOSTAR) 100 UNIT/ML Solostar Pen [id number] Yes Inject 37 Units into the skin at bedtime. Caro Harlene POUR, NP  Active   Insulin  Pen Needle (B-D ULTRAFINE III SHORT PEN) 31G X 8 MM MISC [id number] Yes Use for insulin  injections. Dx: E11.22 Caro Harlene POUR, NP  Active   ketoconazole  (NIZORAL ) 2 % cream [id number] Yes APPLY TO BOTH FEET AND BETWEEN TOES ONCE DAILY FOR 6 WEEKS. McCaughan, Dia D, DPM  Active   levothyroxine  (SYNTHROID ) 112 MCG tablet [id number] Yes Take 1 tablet (112 mcg total) by mouth daily before breakfast. Eubanks, Jessica K, NP  Active   losartan  (COZAAR ) 100 MG tablet [id number] Yes Take 1 tablet (100 mg total) by mouth daily. Eubanks, Jessica K, NP  Active   metFORMIN  (GLUCOPHAGE ) 500 MG tablet [id number] Yes TAKE 1 TABLET BY MOUTH 2 TIMES DAILY WITH A MEAL. Eubanks, Jessica K, NP  Active   Multiple Vitamin (MULTIVITAMIN) tablet [id number] Yes Take 1 tablet by mouth daily. [provider]  Active   NONFORMULARY OR COMPOUNDED ITEM [id number] Yes Antifungal solution: Terbinafine  3%, Fluconazole 2%, Tea Tree Oil 5%, Urea 10%, Ibuprofen  2% in DMSO suspension #30mL Gaynel Delon CROME, DPM  Active   rosuvastatin  (CRESTOR ) 10 MG tablet [id number] Yes Take 1 tablet (10 mg total) by mouth daily. Caro Harlene POUR, NP  Active   Semaglutide ,0.25 or 0.5MG /DOS, (OZEMPIC , 0.25 OR 0.5 MG/DOSE,) 2 MG/3ML SOPN [id number] Yes Inject 0.25 mg into the skin once a week. Caro Harlene POUR, NP  Active               11/28/2023   11:16 AM 11/28/2023   11:04 AM 05/07/2023    8:44 AM  Vitals with BMI  Height  6' 0 6' 0  Weight  229 lbs 13 oz   BMI  31.16   Systolic 138 138   Diastolic 102 100   Pulse  52        Assessment/Plan:   Diabetes: - Currently uncontrolled; goal A1c <7%. Cardiorenal risk reduction is optimized.. Blood pressure is not at goal <130/80. LDL is at goal.  - reviewed the importance of checking blood sugars. It was recommended to his PCP during our last call that he start continuous glucose monitoring therapy.  Patient reported receiving Freestyle Libre 3 Sensors from [employer] but He does not know how to attach or use the sensors.   -Ozempic  dose should be increased as Patient has been on the 0.25mg   dose for more than 4 weeks.  -Patient's Provider was in agreement with the increased dose from our last call.  Patient was instructed to increase the Ozempic  dose from 0.25 mg weekly to 0.5 mg weekly. Prescription with the new strength will be sent co-signature required.  A note was sent to the clinic staff about having the Patient come in for training.  Plan; Follow up on training and dose increase in 1 week.   Cassius DOROTHA Brought, PharmD, BCACP Clinical Pharmacist [phone]       "

## 2024-01-22 ENCOUNTER — Telehealth: Payer: Self-pay

## 2024-01-22 ENCOUNTER — Ambulatory Visit: Admitting: Adult Health

## 2024-01-22 NOTE — Telephone Encounter (Signed)
 Per secure chat message from Cassius Brought (part of the pharmacy team with [facility]), patient needs to be shown how to apply freestyle libre sensor.  I called patient and schedule an appointment for today with Monina Medina-Vargas, NP and we will also follow-up on B/P at that time, as it was elevated at last appointment. Patient was told to bring home B/P readings with him

## 2024-01-25 ENCOUNTER — Encounter: Payer: Self-pay | Admitting: Adult Health

## 2024-01-25 ENCOUNTER — Ambulatory Visit (INDEPENDENT_AMBULATORY_CARE_PROVIDER_SITE_OTHER): Admitting: Adult Health

## 2024-01-25 VITALS — BP 160/90 | HR 69 | Temp 97.3°F | Ht 72.0 in | Wt 223.2 lb

## 2024-01-25 DIAGNOSIS — E782 Mixed hyperlipidemia: Secondary | ICD-10-CM

## 2024-01-25 DIAGNOSIS — Z794 Long term (current) use of insulin: Secondary | ICD-10-CM | POA: Diagnosis not present

## 2024-01-25 DIAGNOSIS — E1122 Type 2 diabetes mellitus with diabetic chronic kidney disease: Secondary | ICD-10-CM | POA: Diagnosis not present

## 2024-01-25 DIAGNOSIS — I10 Essential (primary) hypertension: Secondary | ICD-10-CM

## 2024-01-25 DIAGNOSIS — N1831 Chronic kidney disease, stage 3a: Secondary | ICD-10-CM | POA: Diagnosis not present

## 2024-01-25 DIAGNOSIS — E890 Postprocedural hypothyroidism: Secondary | ICD-10-CM | POA: Diagnosis not present

## 2024-01-25 MED ORDER — KETOCONAZOLE 2 % EX CREA: TOPICAL_CREAM | Freq: Every day | CUTANEOUS | Status: AC | PRN

## 2024-01-25 MED ORDER — HYDROCHLOROTHIAZIDE 12.5 MG PO TABS: 12.5000 mg | ORAL_TABLET | Freq: Every day | ORAL | 3 refills | Status: AC

## 2024-01-25 MED ORDER — LEVOTHYROXINE SODIUM 125 MCG PO TABS: 125.0000 ug | ORAL_TABLET | Freq: Every day | ORAL | 3 refills | Status: AC

## 2024-01-25 MED ORDER — HYDROCORTISONE 1 % EX OINT: 1.0000 | TOPICAL_OINTMENT | Freq: Two times a day (BID) | CUTANEOUS | Status: AC | PRN

## 2024-01-25 NOTE — Progress Notes (Signed)
 "  [facility]  Provider:  Jereld Serum DNP  Code Status:  Full Code  Goals of Care:     04/26/2023   10:00 AM  Advanced Directives  Does Patient Have a Medical Advance Directive? No  Would patient like information on creating a medical advance directive? Yes (MAU/Ambulatory/Procedural Areas - Information given)     Chief Complaint  Patient presents with   Medical Management of Chronic Issues    Discuss BP and how to use freestyle    Discussed the use of AI scribe software for clinical note transcription with the patient, who gave verbal consent to proceed.  HPI: Patient is a 74 y.o. male seen today for an acute visit to follow-up on blood pressure management and setup of Libre continuous glucose monitoring. He is accompanied by his wife, who has early stage dementia.  He is here for follow-up on blood pressure management. No dizziness is reported. He has a home blood pressure monitor but does not check his blood pressure regularly. He is currently taking losartan  100 mg daily for hypertension. He has not had issues with high blood pressure until recently and attributes it to possible stress and dietary factors.  He is also here to set up his Her[place] continuous glucose monitoring system on his phone. He has type 2 diabetes with a recent A1c of 9.0% as of November 28, 2023. He is currently on metformin  500 mg twice daily, Lantus  37 units at bedtime, and Ozempic  0.5 mg weekly. He is not consistent with his diabetes medication regimen.  He has a history of hypothyroidism following thyroid  surgery for goiter. He is currently taking levothyroxine  112 mcg daily. His last TSH was 33.99 as of November 28, 2023.   He has mixed hyperlipidemia for which he takes Crestor  10 mg daily.  No shortness of breath or sleep apnea. He reports occasional swelling in his feet, which he manages by elevating them.  Socially, he is retired and spends time caring for his wife, who has early stage  dementia. He has three daughters, six grandchildren, and two great-grandsons.     Past Medical History:  Diagnosis Date   Bell's palsy    2005   Carpal tunnel syndrome    Cataract    CKD stage 2 due to type 2 diabetes mellitus (HCC) 12/01/2015   Diabetes mellitus without complication (HCC)    Edema    Gout, unspecified    Hyperlipidemia    Hypertension    Internal hemorrhoids without mention of complication    Malignant neoplasm of thyroid  gland (HCC)    Memory loss    Mixed disorders as reaction to stress    Obesity, unspecified    Other malaise and fatigue    Other psoriasis    Sleep apnea    no CPAP used 09-23-15   Supraventricular premature beats    Thyroid  disease    Trigger finger (acquired)    Unspecified sleep apnea     Past Surgical History:  Procedure Laterality Date   CATARACT EXTRACTION W/ INTRAOCULAR LENS IMPLANT Right 06/05/2016   Dr. Naomi   COLONOSCOPY  10/07/2015   one polyp, Dr. Legrand   TOTAL THYROIDECTOMY  2007   Dr. Elsie Sink    Allergies[1]  Outpatient Encounter Medications as of 01/25/2024  Medication Sig   aspirin EC 81 MG tablet Take 81 mg by mouth daily.   blood glucose meter kit and supplies Dispense based on patient and insurance preference. Use up to four times  daily as directed. (FOR ICD-10 E10.9, E11.9).   Blood Pressure Monitoring (BLOOD PRESSURE MONITOR 3) DEVI 1 Device by Other route as directed.   Continuous Glucose Sensor (FREESTYLE LIBRE 3 SENSOR) MISC by Does not apply route. Apply one patch every 15 days   glucose blood (ACCU-CHEK GUIDE) test strip USE AS DIRECTED   hydrochlorothiazide  (HYDRODIURIL ) 12.5 MG tablet Take 1 tablet (12.5 mg total) by mouth daily.   insulin  glargine (LANTUS  SOLOSTAR) 100 UNIT/ML Solostar Pen Inject 37 Units into the skin at bedtime.   Insulin  Pen Needle (B-D ULTRAFINE III SHORT PEN) 31G X 8 MM MISC Use for insulin  injections. Dx: E11.22   levothyroxine  (SYNTHROID ) 125 MCG tablet Take 1 tablet (125  mcg total) by mouth daily.   losartan  (COZAAR ) 100 MG tablet Take 1 tablet (100 mg total) by mouth daily.   metFORMIN  (GLUCOPHAGE ) 500 MG tablet TAKE 1 TABLET BY MOUTH 2 TIMES DAILY WITH A MEAL.   Multiple Vitamin (MULTIVITAMIN) tablet Take 1 tablet by mouth daily.   rosuvastatin  (CRESTOR ) 10 MG tablet Take 1 tablet (10 mg total) by mouth daily.   Semaglutide ,0.25 or 0.5MG /DOS, (OZEMPIC , 0.25 OR 0.5 MG/DOSE,) 2 MG/3ML SOPN Inject 0.5 mg into the skin once a week.   [DISCONTINUED] hydrocortisone  1 % ointment Apply to rash twice daily until resolved. (Patient taking differently: 1 Application as needed. Apply to rash twice daily until resolved.)   [DISCONTINUED] levothyroxine  (SYNTHROID ) 112 MCG tablet Take 1 tablet (112 mcg total) by mouth daily before breakfast.   hydrocortisone  1 % ointment Apply 1 Application topically 2 (two) times daily as needed for itching. Apply to rash twice daily until resolved.   ketoconazole  (NIZORAL ) 2 % cream Apply topically daily as needed for irritation. Apply to both feet and between toes once daily for 6 weeks.   NONFORMULARY OR COMPOUNDED ITEM Antifungal solution: Terbinafine  3%, Fluconazole 2%, Tea Tree Oil 5%, Urea 10%, Ibuprofen  2% in DMSO suspension #30mL (Patient not taking: Reported on 01/25/2024)   [DISCONTINUED] ketoconazole  (NIZORAL ) 2 % cream APPLY TO BOTH FEET AND BETWEEN TOES ONCE DAILY FOR 6 WEEKS. (Patient not taking: Reported on 01/25/2024)   No facility-administered encounter medications on file as of 01/25/2024.    Review of Systems:  Review of Systems  Constitutional:  Negative for activity change, appetite change and fever.  HENT:  Negative for sore throat.   Eyes: Negative.   Cardiovascular:  Negative for chest pain and leg swelling.  Gastrointestinal:  Negative for abdominal distention, diarrhea and vomiting.  Genitourinary:  Negative for dysuria, frequency and urgency.  Skin:  Negative for color change.  Neurological:  Negative for dizziness  and headaches.  Psychiatric/Behavioral:  Negative for behavioral problems and sleep disturbance. The patient is not nervous/anxious.     Health Maintenance  Topic Date Due   Zoster Vaccines- Shingrix (1 of 2) Never done   COVID-19 Vaccine (6 - 2025-26 season) 02/10/2024 (Originally 09/17/2023)   FOOT EXAM  04/03/2024   Medicare Annual Wellness (AWV)  04/25/2024   Diabetic kidney evaluation - Urine ACR  05/27/2024   HEMOGLOBIN A1C  05/27/2024   Diabetic kidney evaluation - eGFR measurement  11/27/2024   OPHTHALMOLOGY EXAM  01/01/2025   Colonoscopy  10/06/2025   DTaP/Tdap/Td (3 - Td or Tdap) 07/23/2031   Pneumococcal Vaccine: 50+ Years  Completed   Influenza Vaccine  Completed   Hepatitis C Screening  Completed   Meningococcal B Vaccine  Aged Out    Physical Exam: Vitals:   01/25/24 0857  01/25/24 0936  BP: (!) 164/98 (!) 160/90  Pulse: 69   Temp: (!) 97.3 F (36.3 C)   TempSrc: Temporal   SpO2: 99%   Weight: 223 lb 3.2 oz (101.2 kg)   Height: 6' (1.829 m)    Body mass index is 30.27 kg/m. Physical Exam Constitutional:      General: He is not in acute distress.    Appearance: He is obese.  HENT:     Head: Normocephalic and atraumatic.     Mouth/Throat:     Mouth: Mucous membranes are moist.  Eyes:     Conjunctiva/sclera: Conjunctivae normal.  Cardiovascular:     Rate and Rhythm: Normal rate and regular rhythm.     Pulses: Normal pulses.     Heart sounds: Normal heart sounds.  Pulmonary:     Effort: Pulmonary effort is normal.     Breath sounds: Normal breath sounds.  Abdominal:     General: Bowel sounds are normal.     Palpations: Abdomen is soft.  Musculoskeletal:        General: Swelling present. Normal range of motion.     Cervical back: Normal range of motion.     Right lower leg: No edema.     Left lower leg: Edema present.     Comments: LLE 2+edema  Skin:    General: Skin is warm and dry.  Neurological:     General: No focal deficit present.      Mental Status: He is alert and oriented to person, place, and time.  Psychiatric:        Mood and Affect: Mood normal.        Behavior: Behavior normal.        Thought Content: Thought content normal.        Judgment: Judgment normal.     Labs reviewed: Basic Metabolic Panel: Recent Labs    11/28/23 1225  NA 140  K 4.6  CL 104  CO2 28  GLUCOSE 175*  BUN 14  CREATININE 1.31*  CALCIUM  9.7  TSH 33.99*   Liver Function Tests: Recent Labs    11/28/23 1225  AST 20  ALT 12  BILITOT 0.4  PROT 7.1   No results for input(s): LIPASE, AMYLASE in the last 8760 hours. No results for input(s): AMMONIA in the last 8760 hours. CBC: Recent Labs    11/28/23 1225  WBC 3.2*  NEUTROABS 1,798  HGB 12.9*  HCT 40.0  MCV 83.9  PLT 224   Lipid Panel: Recent Labs    11/28/23 1225  CHOL 167  HDL 77  LDLCALC 78  TRIG 42  CHOLHDL 2.2   Lab Results  Component Value Date   HGBA1C 9.8 (H) 11/28/2023    Procedures since last visit: No results found.  Assessment/Plan  1. Uncontrolled hypertension (Primary) -  Blood pressure elevated at 164/98 mmHg. Currently on losartan  100 mg daily. Stress and dietary factors may contribute. - Added hydrochlorothiazide  12.5 mg daily. - Instructed to log blood pressure readings regularly. - Advised dietary modifications to reduce salt intake and increase fish consumption. - hydrochlorothiazide  (HYDRODIURIL ) 12.5 MG tablet; Take 1 tablet (12.5 mg total) by mouth daily.  Dispense: 90 tablet; Refill: 3  2. Type 2 diabetes mellitus with stage 3a chronic kidney disease, with long-term current use of insulin  (HCC) -  A1c elevated at 9.0%. Currently on metformin , Lantus , and Ozempic . Discussed importance of medication adherence and monitoring. - Continue metformin  500 mg twice daily. - Continue Lantus  37  units at bedtime. - Continue Ozempic  0.5 mg weekly. - Set up continuous glucose monitoring with Libre. - Encouraged consistent medication  adherence.  3. Postoperative hypothyroidism -  TSH elevated at 33.99. Currently on levothyroxine  112 mcg daily. Plan to adjust dosage based on TSH levels. - Increased levothyroxine  to 125 mcg daily. - Will check TSH in 6 weeks. - TSH; Future - levothyroxine  (SYNTHROID ) 125 MCG tablet; Take 1 tablet (125 mcg total) by mouth daily.  Dispense: 90 tablet; Refill: 3  4. Mixed hyperlipidemia -  - Continue Crestor  10 mg daily.    General health maintenance Discussed importance of vaccinations and lifestyle modifications. - Recommended shingles vaccine.    Labs/tests ordered:  tsh in 6 weeks   Return if symptoms worsen or fail to improve.  Emary Zalar Medina-Vargas, NP        [1] No Known Allergies  "

## 2024-01-28 ENCOUNTER — Telehealth: Payer: Self-pay

## 2024-01-28 NOTE — Telephone Encounter (Signed)
 Copied from CRM #[id number]. Topic: Clinical - Medication Question >> Jan 25, 2024  3:21 PM Mercer PEDLAR wrote: Reason for CRM: Patient would like a callback to discuss dosage of Semaglutide ,0.25 or 0.5MG /DOS, (OZEMPIC , 0.25 OR 0.5 MG/DOSE,) 2 MG/3ML SOPN.

## 2024-01-29 ENCOUNTER — Telehealth: Payer: Self-pay | Admitting: Pharmacist

## 2024-01-29 NOTE — Progress Notes (Signed)
" ° °  01/29/2024 Name: WOODRUFF SKIRVIN MRN: [id number] DOB: 03/14/1950  Chief Complaint  Patient presents with   Medication Management    Diabetes    Patient was called to follow up on Ozempic  dose increase. Unfortunately, He did not answer the phone. HIPAA compliant message was left on his voicemail.   Plan: Call Patient back in 2 weeks.  Cassius DOROTHA Brought, PharmD, BCACP Clinical Pharmacist [phone]  "

## 2024-01-31 ENCOUNTER — Telehealth: Payer: Self-pay | Admitting: Pharmacist

## 2024-01-31 DIAGNOSIS — E1122 Type 2 diabetes mellitus with diabetic chronic kidney disease: Secondary | ICD-10-CM

## 2024-01-31 NOTE — Progress Notes (Signed)
" ° °  01/31/2024 Name: Mitchell Gibbs MRN: [id number] DOB: [birth year]  Patient was called to follow up on Ozempic  dose. He called me back earlier this week and left me a message and I was calling Him back. Unfortunately, He did not answer the phone. HIPAA compliant message was left on his voicemail.     Cassius DOROTHA Brought, PharmD, BCACP Clinical Pharmacist [phone]  "

## 2024-02-12 ENCOUNTER — Telehealth: Payer: Self-pay | Admitting: Pharmacist

## 2024-02-12 DIAGNOSIS — E1121 Type 2 diabetes mellitus with diabetic nephropathy: Secondary | ICD-10-CM

## 2024-02-12 NOTE — Progress Notes (Signed)
 "  02/12/2024 Name: Mitchell Gibbs MRN: [id number] DOB: December 13, 1950  Chief Complaint  Patient presents with   Medication Management    Mitchell Gibbs is a 74 y.o. year old male who presented for a telephone visit.   They were referred to the pharmacist by their PCP for assistance in managing diabetes. He started using Mitchell Libre Continuous Glucose Monitoring sensors but reported today that the sensor he just put on would not scan.     Subjective: Mitchell Gibbs is a 74 year old male referred to Pharmacy Services for help with managing diabetes.  He has a medical history significant for:  chronic kidney disease stage 2, type 2 diabetes, hypertension, hyperlipidemia, hypothyroidism and sleep apnea.    Care Team: Primary Care Provider: Caro Harlene POUR, NP ; 04/29/24   Medication Access/Adherence  Current Pharmacy:  [id number] - [place] [place] - [address] [place] [address] Phone: [phone] Fax: [phone]   Patient reports affordability concerns with their medications: No  Patient reports access/transportation concerns to their pharmacy: No  Patient reports adherence concerns with their medications:  No      Diabetes:  Current medications:   Lantus  37 units daily Ozempic  0.5 mg weekly   Patient denies hypoglycemic s/sx including  dizziness, shakiness, sweating. Patient denies hyperglycemic symptoms including  polyuria, polydipsia, polyphagia, nocturia, neuropathy, blurred vision.   Macrovascular and Microvascular Risk Reduction:  Statin? yes (rosuvastatin ) filled 12/30/23 #90 ACEi/ARB? Yes Losartan  100mg  lf 11/28/23 #90 Last urinary albumin/creatinine ratio:  Lab Results  Component Value Date   MICRALBCREAT 219 (H) 11/28/2023   MICRALBCREAT 38 (H) 03/17/2022   MICRALBCREAT 14 03/05/2017   MICRALBCREAT 3.5 08/11/2013   Last eye exam:  Lab Results  Component Value Date   HMDIABEYEEXA No Retinopathy  01/02/2024   Last foot exam: 04/04/2023 Tobacco Use:  Tobacco Use: Medium Risk (01/25/2024)   Patient History    Smoking Tobacco Use: Former    Smokeless Tobacco Use: Never    Passive Exposure: Not on file     Objective:  Lab Results  Component Value Date   HGBA1C 9.8 (H) 11/28/2023    Lab Results  Component Value Date   CREATININE 1.31 (H) 11/28/2023   BUN 14 11/28/2023   NA 140 11/28/2023   K 4.6 11/28/2023   CL 104 11/28/2023   CO2 28 11/28/2023    Lab Results  Component Value Date   CHOL 167 11/28/2023   HDL 77 11/28/2023   LDLCALC 78 11/28/2023   TRIG 42 11/28/2023   CHOLHDL 2.2 11/28/2023      01/25/2024    9:36 AM 01/25/2024    8:57 AM 11/28/2023   11:16 AM  Vitals with BMI  Height  6' 0   Weight  223 lbs 3 oz   BMI  30.26   Systolic 160 164 861  Diastolic 90 98 102  Pulse  69        Assessment/Plan:   Diabetes: - Currently uncontrolled; goal A1c <7%. Cardiorenal risk reduction has opportunities for improvement.. Blood pressure is not at goal <130/80. LDL is at goal.  - Ozempic  dose was recently increased.   - Recommend increasing Ozempic  dose to 1 mg weekly  -Follow up with the Patient on Monday.  He was coached over the phone on how to apply the [employer] and He communicated understanding.  If is not able to get the sensor working, I will see  him in person.  Follow Up Plan:    Cassius DOROTHA Brought, PharmD, BCACP Clinical Pharmacist [phone]    "

## 2024-02-15 ENCOUNTER — Telehealth: Payer: Self-pay | Admitting: Nurse Practitioner

## 2024-02-15 NOTE — Telephone Encounter (Signed)
 Copied from CRM #[id number]. Topic: Clinical - Medical Advice >> Feb 15, 2024 11:18 AM Diannia H wrote: Reason for CRM: Patient is calling because he tried to put this own and it didn't work. He is wanting to know could he get someone to call him or come down and get some help with it? Continuous Glucose Sensor (FREESTYLE LIBRE 3 SENSOR) MISC. Could you assist? Callback number is [phone].

## 2024-02-15 NOTE — Telephone Encounter (Signed)
 Spoke with patient stating that he did talk with the Pharmacist but not get contact with her since last week but will call her again.

## 2024-02-20 ENCOUNTER — Ambulatory Visit: Admitting: Podiatry

## 2024-02-20 ENCOUNTER — Encounter: Payer: Self-pay | Admitting: Podiatry

## 2024-02-20 DIAGNOSIS — E1122 Type 2 diabetes mellitus with diabetic chronic kidney disease: Secondary | ICD-10-CM

## 2024-02-20 DIAGNOSIS — B351 Tinea unguium: Secondary | ICD-10-CM

## 2024-02-20 DIAGNOSIS — L84 Corns and callosities: Secondary | ICD-10-CM

## 2024-02-21 NOTE — Progress Notes (Unsigned)
"  °  Subjective:  Patient ID: Mitchell Gibbs, male    DOB: 28-Aug-1950,  MRN: [id number]  Mitchell Gibbs presents to clinic today for {jgcomplaint:23593}  New problem(s): None. {jgcomplaint:23593}  PCP is Caro Harlene POUR, NP.  Allergies[1]  Review of Systems: Negative except as noted in the HPI.  Objective: No changes noted in today's physical examination. There were no vitals filed for this visit. Mitchell Gibbs is a pleasant 75 y.o. male {jgbodyhabitus:24098} AAO x 3.  Vascular Examination: Vascular status intact b/l with palpable pedal pulses. CFT immediate b/l. Pedal hair present. No edema. No pain with calf compression b/l. Skin temperature gradient WNL b/l. No varicosities noted. No cyanosis or clubbing noted.  Neurological Examination: Sensation grossly intact b/l with 10 gram monofilament. Vibratory sensation intact b/l.  Dermatological Examination: Pedal skin with normal turgor, texture and tone b/l.  No open wounds. No interdigital macerations.   Toenails 1-5 b/l thick, discolored, elongated with subungual debris and pain on dorsal palpation.   Hyperkeratotic lesion(s) submet head 3 left foot.  No erythema, no edema, no drainage, no fluctuance.  Musculoskeletal Examination: Muscle strength 5/5 to all lower extremity muscle groups bilaterally. HAV with bunion deformity noted b/l LE. Hammertoe(s) 2-5 b/l.SABRA No pain, crepitus or joint limitation noted with ROM b/l LE.  Patient ambulates independently without assistive aids.  Radiographs: None  Assessment/Plan: 1. Pain due to onychomycosis of toenails of both feet   2. Callus   3. Type 2 diabetes mellitus with stage 2 chronic kidney disease, with long-term current use of insulin  (HCC)     No orders of the defined types were placed in this encounter.   None {Jgplan:23602::-Patient/POA to call should there be question/concern in the interim.}   Return in about 3 months (around 05/19/2024).  Delon LITTIE Merlin, DPM       [place] LOCATION: [address] [place] [address]                   Office [phone]   [facility] LOCATION: [address] [place], [place] [address] Office [phone]     [1] No Known Allergies  "

## 2024-03-03 ENCOUNTER — Ambulatory Visit: Admitting: Nurse Practitioner

## 2024-03-07 ENCOUNTER — Ambulatory Visit: Admitting: Nurse Practitioner

## 2024-04-29 ENCOUNTER — Ambulatory Visit: Payer: Self-pay | Admitting: Nurse Practitioner

## 2024-05-27 ENCOUNTER — Ambulatory Visit: Admitting: Podiatry

## 2024-05-30 ENCOUNTER — Ambulatory Visit: Admitting: Nurse Practitioner
